# Patient Record
Sex: Female | Born: 1955
Health system: Southern US, Community
[De-identification: ages and names within clinical notes are randomized; demographics above are authoritative.]

## PROBLEM LIST (undated history)

## (undated) DIAGNOSIS — Z87442 Personal history of urinary calculi: Secondary | ICD-10-CM

## (undated) DIAGNOSIS — R188 Other ascites: Secondary | ICD-10-CM

## (undated) DIAGNOSIS — N189 Chronic kidney disease, unspecified: Secondary | ICD-10-CM

## (undated) DIAGNOSIS — R51 Headache: Secondary | ICD-10-CM

## (undated) DIAGNOSIS — K56609 Unspecified intestinal obstruction, unspecified as to partial versus complete obstruction: Secondary | ICD-10-CM

## (undated) DIAGNOSIS — G709 Myoneural disorder, unspecified: Secondary | ICD-10-CM

## (undated) DIAGNOSIS — M419 Scoliosis, unspecified: Secondary | ICD-10-CM

## (undated) DIAGNOSIS — M1990 Unspecified osteoarthritis, unspecified site: Secondary | ICD-10-CM

## (undated) DIAGNOSIS — Z789 Other specified health status: Secondary | ICD-10-CM

## (undated) DIAGNOSIS — I10 Essential (primary) hypertension: Secondary | ICD-10-CM

## (undated) DIAGNOSIS — Z87440 Personal history of urinary (tract) infections: Secondary | ICD-10-CM

## (undated) DIAGNOSIS — R1011 Right upper quadrant pain: Secondary | ICD-10-CM

## (undated) DIAGNOSIS — Z9289 Personal history of other medical treatment: Secondary | ICD-10-CM

## (undated) DIAGNOSIS — G35 Multiple sclerosis: Secondary | ICD-10-CM

## (undated) DIAGNOSIS — M810 Age-related osteoporosis without current pathological fracture: Secondary | ICD-10-CM

## (undated) DIAGNOSIS — D649 Anemia, unspecified: Secondary | ICD-10-CM

## (undated) DIAGNOSIS — B029 Zoster without complications: Secondary | ICD-10-CM

## (undated) HISTORY — PX: TONSILLECTOMY: SUR1361

## (undated) HISTORY — DX: Multiple sclerosis: G35

---
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
## 1997-12-29 ENCOUNTER — Encounter: Payer: Self-pay | Admitting: Emergency Medicine

## 1997-12-29 ENCOUNTER — Emergency Department (HOSPITAL_COMMUNITY): Admission: EM | Admit: 1997-12-29 | Discharge: 1997-12-29 | Payer: Self-pay | Admitting: Emergency Medicine

## 2001-03-20 ENCOUNTER — Other Ambulatory Visit: Admission: RE | Admit: 2001-03-20 | Discharge: 2001-03-20 | Payer: Self-pay | Admitting: Obstetrics and Gynecology

## 2005-06-04 ENCOUNTER — Ambulatory Visit: Payer: Self-pay | Admitting: Internal Medicine

## 2005-06-04 ENCOUNTER — Ambulatory Visit (HOSPITAL_COMMUNITY): Admission: RE | Admit: 2005-06-04 | Discharge: 2005-06-04 | Payer: Self-pay | Admitting: Internal Medicine

## 2007-05-20 ENCOUNTER — Other Ambulatory Visit: Admission: RE | Admit: 2007-05-20 | Discharge: 2007-05-20 | Payer: Self-pay | Admitting: Obstetrics and Gynecology

## 2008-06-08 ENCOUNTER — Other Ambulatory Visit: Admission: RE | Admit: 2008-06-08 | Discharge: 2008-06-08 | Payer: Self-pay | Admitting: Obstetrics and Gynecology

## 2008-07-13 ENCOUNTER — Encounter: Payer: Self-pay | Admitting: Obstetrics & Gynecology

## 2008-07-13 ENCOUNTER — Inpatient Hospital Stay (HOSPITAL_COMMUNITY): Admission: RE | Admit: 2008-07-13 | Discharge: 2008-07-14 | Payer: Self-pay | Admitting: Obstetrics & Gynecology

## 2009-01-28 HISTORY — PX: VAGINAL HYSTERECTOMY: SUR661

## 2009-08-04 ENCOUNTER — Encounter: Admission: RE | Admit: 2009-08-04 | Discharge: 2009-08-04 | Payer: Self-pay | Admitting: Family Medicine

## 2009-08-15 ENCOUNTER — Other Ambulatory Visit: Admission: RE | Admit: 2009-08-15 | Discharge: 2009-08-15 | Payer: Self-pay | Admitting: Diagnostic Radiology

## 2009-08-15 ENCOUNTER — Encounter: Admission: RE | Admit: 2009-08-15 | Discharge: 2009-08-15 | Payer: Self-pay | Admitting: Family Medicine

## 2010-02-18 ENCOUNTER — Encounter: Payer: Self-pay | Admitting: Family Medicine

## 2010-03-18 ENCOUNTER — Inpatient Hospital Stay (HOSPITAL_COMMUNITY)
Admission: EM | Admit: 2010-03-18 | Discharge: 2010-03-21 | DRG: 668 | Disposition: A | Discharge status: Home / Self Care | Payer: Medicare Other | Attending: Emergency Medicine | Admitting: Emergency Medicine

## 2010-03-18 DIAGNOSIS — R6520 Severe sepsis without septic shock: Secondary | ICD-10-CM | POA: Diagnosis present

## 2010-03-18 DIAGNOSIS — G35 Multiple sclerosis: Secondary | ICD-10-CM | POA: Diagnosis present

## 2010-03-18 DIAGNOSIS — N179 Acute kidney failure, unspecified: Secondary | ICD-10-CM | POA: Diagnosis present

## 2010-03-18 DIAGNOSIS — E871 Hypo-osmolality and hyponatremia: Secondary | ICD-10-CM | POA: Diagnosis present

## 2010-03-18 DIAGNOSIS — N390 Urinary tract infection, site not specified: Principal | ICD-10-CM | POA: Diagnosis present

## 2010-03-18 DIAGNOSIS — D500 Iron deficiency anemia secondary to blood loss (chronic): Secondary | ICD-10-CM | POA: Diagnosis present

## 2010-03-18 DIAGNOSIS — A419 Sepsis, unspecified organism: Secondary | ICD-10-CM | POA: Diagnosis present

## 2010-03-18 DIAGNOSIS — R310 Gross hematuria: Secondary | ICD-10-CM | POA: Diagnosis present

## 2010-03-18 HISTORY — DX: Essential (primary) hypertension: I10

## 2010-03-18 LAB — CBC
HCT: 27.2 % — ABNORMAL LOW (ref 36.0–46.0)
Hemoglobin: 9.3 g/dL — ABNORMAL LOW (ref 12.0–15.0)
MCH: 31.6 pg (ref 26.0–34.0)
MCHC: 34.2 g/dL (ref 30.0–36.0)
MCV: 92.5 fL (ref 78.0–100.0)
Platelets: 364 10*3/uL (ref 150–400)
RBC: 2.94 MIL/uL — ABNORMAL LOW (ref 3.87–5.11)
RDW: 12.8 % (ref 11.5–15.5)
WBC: 9 10*3/uL (ref 4.0–10.5)

## 2010-03-18 LAB — DIFFERENTIAL
Basophils Absolute: 0 10*3/uL (ref 0.0–0.1)
Basophils Relative: 0 % (ref 0–1)
Eosinophils Absolute: 0.1 10*3/uL (ref 0.0–0.7)
Eosinophils Relative: 1 % (ref 0–5)
Lymphocytes Relative: 30 % (ref 12–46)
Lymphs Abs: 2.7 10*3/uL (ref 0.7–4.0)
Monocytes Absolute: 0.4 10*3/uL (ref 0.1–1.0)
Monocytes Relative: 4 % (ref 3–12)
Neutro Abs: 5.8 10*3/uL (ref 1.7–7.7)
Neutrophils Relative %: 65 % (ref 43–77)

## 2010-03-18 LAB — COMPREHENSIVE METABOLIC PANEL
Albumin: 3.6 g/dL (ref 3.5–5.2)
BUN: 43 mg/dL — ABNORMAL HIGH (ref 6–23)
CO2: 20 mEq/L (ref 19–32)
Calcium: 9.2 mg/dL (ref 8.4–10.5)
Chloride: 102 mEq/L (ref 96–112)
Creatinine, Ser: 1.72 mg/dL — ABNORMAL HIGH (ref 0.4–1.2)
GFR calc non Af Amer: 31 mL/min — ABNORMAL LOW (ref >60)
Total Bilirubin: 0.4 mg/dL (ref 0.3–1.2)

## 2010-03-18 LAB — PROTIME-INR
INR: 1.01 (ref 0.00–1.49)
Prothrombin Time: 13.5 seconds (ref 11.6–15.2)

## 2010-03-18 LAB — URINALYSIS, ROUTINE W REFLEX MICROSCOPIC

## 2010-03-19 LAB — DIFFERENTIAL
Basophils Absolute: 0 10*3/uL (ref 0.0–0.1)
Basophils Relative: 0 % (ref 0–1)
Eosinophils Absolute: 0.2 10*3/uL (ref 0.0–0.7)
Eosinophils Relative: 3 % (ref 0–5)
Lymphocytes Relative: 53 % — ABNORMAL HIGH (ref 12–46)
Lymphs Abs: 3.5 10*3/uL (ref 0.7–4.0)
Monocytes Absolute: 0.5 10*3/uL (ref 0.1–1.0)
Monocytes Relative: 7 % (ref 3–12)
Neutro Abs: 2.4 10*3/uL (ref 1.7–7.7)
Neutrophils Relative %: 37 % — ABNORMAL LOW (ref 43–77)

## 2010-03-19 LAB — CBC
HCT: 23.3 % — ABNORMAL LOW (ref 36.0–46.0)
Hemoglobin: 7.7 g/dL — ABNORMAL LOW (ref 12.0–15.0)
MCH: 31.3 pg (ref 26.0–34.0)
MCHC: 33 g/dL (ref 30.0–36.0)
MCV: 94.7 fL (ref 78.0–100.0)
Platelets: 286 10*3/uL (ref 150–400)
RBC: 2.46 MIL/uL — ABNORMAL LOW (ref 3.87–5.11)
RDW: 13 % (ref 11.5–15.5)
WBC: 6.5 10*3/uL (ref 4.0–10.5)

## 2010-03-19 LAB — URINE CULTURE: Culture: NO GROWTH

## 2010-03-19 LAB — BASIC METABOLIC PANEL
BUN: 28 mg/dL — ABNORMAL HIGH (ref 6–23)
CO2: 23 mEq/L (ref 19–32)
Calcium: 8.4 mg/dL (ref 8.4–10.5)
Chloride: 107 mEq/L (ref 96–112)
Creatinine, Ser: 1.12 mg/dL (ref 0.4–1.2)
GFR calc Af Amer: >60 mL/min (ref >60)
GFR calc non Af Amer: 50 mL/min — ABNORMAL LOW (ref >60)
Glucose, Bld: 87 mg/dL (ref 70–99)
Potassium: 4.3 mEq/L (ref 3.5–5.1)
Sodium: 135 mEq/L (ref 135–145)

## 2010-03-20 ENCOUNTER — Inpatient Hospital Stay (HOSPITAL_COMMUNITY): Payer: Medicare Other

## 2010-03-20 ENCOUNTER — Encounter (HOSPITAL_COMMUNITY): Payer: Self-pay | Admitting: Radiology

## 2010-03-20 LAB — COMPREHENSIVE METABOLIC PANEL
ALT: 13 U/L (ref 0–35)
Albumin: 3.1 g/dL — ABNORMAL LOW (ref 3.5–5.2)
Alkaline Phosphatase: 45 U/L (ref 39–117)
GFR calc Af Amer: >60 mL/min (ref >60)
Potassium: 4.1 mEq/L (ref 3.5–5.1)
Sodium: 140 mEq/L (ref 135–145)
Total Protein: 6.3 g/dL (ref 6.0–8.3)

## 2010-03-20 LAB — CROSSMATCH
ABO/RH(D): O POS
Antibody Screen: NEGATIVE
Unit division: 0

## 2010-03-20 LAB — DIFFERENTIAL
Basophils Absolute: 0 10*3/uL (ref 0.0–0.1)
Basophils Relative: 0 % (ref 0–1)
Eosinophils Absolute: 0.2 10*3/uL (ref 0.0–0.7)
Eosinophils Relative: 3 % (ref 0–5)
Lymphocytes Relative: 43 % (ref 12–46)

## 2010-03-20 LAB — CBC
Platelets: 291 10*3/uL (ref 150–400)
RDW: 13.6 % (ref 11.5–15.5)
WBC: 7.8 10*3/uL (ref 4.0–10.5)

## 2010-03-20 MED ORDER — IOHEXOL 300 MG/ML  SOLN
100.0000 mL | Freq: Once | INTRAMUSCULAR | Status: AC | PRN
  Administered 2010-03-20: 100 mL via INTRAVENOUS

## 2010-03-21 ENCOUNTER — Other Ambulatory Visit: Payer: Self-pay | Admitting: Urology

## 2010-03-21 ENCOUNTER — Inpatient Hospital Stay (HOSPITAL_COMMUNITY): Payer: Medicare Other

## 2010-03-21 ENCOUNTER — Other Ambulatory Visit: Payer: Self-pay | Admitting: Internal Medicine

## 2010-03-21 LAB — DIFFERENTIAL
Basophils Relative: 0 % (ref 0–1)
Eosinophils Absolute: 0.2 10*3/uL (ref 0.0–0.7)
Monocytes Absolute: 0.5 10*3/uL (ref 0.1–1.0)
Monocytes Relative: 7 % (ref 3–12)

## 2010-03-21 LAB — BASIC METABOLIC PANEL
BUN: 8 mg/dL (ref 6–23)
CO2: 29 mEq/L (ref 19–32)
Calcium: 8.9 mg/dL (ref 8.4–10.5)
Creatinine, Ser: 0.65 mg/dL (ref 0.4–1.2)
GFR calc Af Amer: >60 mL/min (ref >60)

## 2010-03-21 LAB — CBC
Hemoglobin: 11.4 g/dL — ABNORMAL LOW (ref 12.0–15.0)
MCH: 31.5 pg (ref 26.0–34.0)
MCHC: 34.4 g/dL (ref 30.0–36.0)
MCV: 91.4 fL (ref 78.0–100.0)
Platelets: 297 10*3/uL (ref 150–400)

## 2010-03-21 LAB — URINE CULTURE: Culture: NO GROWTH

## 2010-03-21 NOTE — Consult Note (Signed)
  NAMEJANNIFER, [name]               ACCOUNT NO.:  [id number]  MEDICAL RECORD NO.:  [id number]           PATIENT TYPE:  I  LOCATION:  A335                          FACILITY:  [facility]  PHYSICIAN:  Ky Barban, M.D.DATE OF BIRTH:  [birth year]  DATE OF CONSULTATION:  03/19/2010 DATE OF DISCHARGE:                                CONSULTATION   CHIEF COMPLAINT:  Gross total painless hematuria.  HISTORY:  A 55 year old female for the last couple of days she is having gross total painless hematuria.  She has severe nausea but no vomiting, fever, chills, and denies having similar problem in the past. Occasionally she has some urgency, urge incontinence, but uses a pad sometime.  Her other problem that she has 3 urinary tract infections during this year and she has been referred to a urologist by her gynecologist, she is waiting to see a urologist.  In the meantime, this thing happened, so she is admitted here by the Hospitalist Service.  She denies having any gross hematuria in the past, but her hematocrit was 27, so she is getting 2 units of blood.  She denies history of having any melena.  No fever or chills.  PAST MEDICAL HISTORY: 1. She has a history of having multiple sclerosis but it is in     remission and she is having absolutely no problem from it. 2. She has hypertension and also had a hysterectomy sometime ago.  MEDICATIONS:  She takes Betaseron for multiple sclerosis, lisinopril for hypertension and Septra and multiple vitamin and calcium.  PHYSICAL EXAMINATION:  GENERAL: Moderately built female not in acute distress, fully conscious, alert, oriented. VITAL SIGNS:  Blood pressure is 120/80, temperature is normal.  On admission, at the time of presentation to the ER her blood pressure was 95/60, she was having tachycardia. ABDOMEN:  Soft and flat.  Liver, spleen, kidneys not palpable.  No CVA tenderness. PELVIC:  Deferred.  She is voiding urine with brownish-colored  urine. She may have no blood clots in the bladder.  She also takes oxybutynin for urgency or urge incontinence which helps her.  LABORATORY DATA:  Her Hemoglobin 7.7, WBC count is 6.5, BUN is 28, and creatinine 1.12.  IMPRESSION: 1. Gross hematuria, possible urinary tract infection. 2. Anemia secondary to possible hematuria. 3. Multiple sclerosis  RECOMMENDATIONS:  Continue IV fluids and do a urine culture.  CT of the abdomen and pelvis with and without contrast.  She will probably need cystoscopy.  I will also send a urine cytologies on her today.     Ky Barban, M.D.     MIJ/MEDQ  D:  03/19/2010  T:  03/20/2010  Job:  [id number]  Electronically Signed by Alleen Borne M.D. on 03/21/2010 11:58:07 AM

## 2010-03-24 NOTE — Discharge Summary (Signed)
NAMEAJA, WHITEHAIR               ACCOUNT NO.:  [id number]  MEDICAL RECORD NO.:  [id number]           PATIENT TYPE:  I  LOCATION:  A335                          FACILITY:  [facility]  PHYSICIAN:  Tarry Kos, MD       DATE OF BIRTH:  Feb 03, 1955  DATE OF ADMISSION:  03/18/2010 DATE OF DISCHARGE:  02/22/2012LH                              DISCHARGE SUMMARY   DISCHARGE DIAGNOSES: 1. Gross hematuria, requiring blood transfusion. 2. Urinary tract infection. 3. History of multiple sclerosis. 4. Status post cystoscopy today with biopsy pending along with urine     cytology pending. 5. Acute renal failure secondary to above that is resolved.  SUMMARY OF HOSPITAL COURSE:  Ms. Reddington is a pleasant 55 year old female with a history of multiple sclerosis, who presented to the emergency room on March 18, 2010, with gross hematuria.  She had significant hematuria that her hemoglobin had dropped to 7.7.  She was given 10 units of blood transfusion, packed red blood cells, and her hemoglobin has been stable at around 11.4.  Urology was consulted.  Her initial urinalysis was overtly red.  She was started on Rocephin here in the hospital.  She had been previously started on Bactrim also by an outpatient physician and had come in with acute renal failure with a BUN and creatinine of 43 and 1.7.  She was provided IV fluids and antibiotics and her renal function returned to normal at 8 and 0.65. Her urine culture has been negative.  Again, Urology was consulted.  She had a CT of her abdomen and pelvis, which showed broad-based bladder wall thickening, seeing anteriorly and superiorly, was likely due to cystitis with neoplasm considered less likely.  Large amount of gas in the urinary bladder, which may be due to recent bladder catheterization; however, fistula could not be excluded.  No evidence of upper urinary tract disease.  She subsequently underwent a cystoscopy today with Urology.  Please see  that final report for full details.  A biopsy was done and that is pending.  She also had urine cytology sent off and that is also pending.  PHYSICAL EXAMINATION:  VITAL SIGNS:  She has been afebrile.  Her vital signs have been stable. GENERAL:  Her hematuria has completely resolved.  She has been alert and oriented x4.  No apparent distress cooperative, and friendly. COR:  Regular rate and rhythm without murmurs, rubs, or gallops. CHEST:  Clear to auscultation bilaterally.  No wheeze, rhonchi, or rales. ABDOMEN:  Soft, nontender, nondistended, positive bowel sounds.  No hepatosplenomegaly. EXTREMITIES:  Without clubbing, cyanosis, or edema. PSYCHIATRIC:  Normal affect. NEUROLOGIC:  No focal neurologic deficits. SKIN:  No rashes.  PLAN:  Ms. Angelita Ingles is going to be discharged home to follow up with the primary care physician in 1-2 weeks, also with urologist in 2-4 weeks. I have switched her over to take ciprofloxacin instead of the Bactrim due to her renal failure.  She will complete a course of ciprofloxacin for another 5 days.  Please see discharge med rec sheet for full details.  She is to resume the rest of her home  medications.  In addition, again I have given her Cipro 250 mg twice a day for another 5 days.  Again at follow up, she has a pathology of her biopsy results today from her cystoscopy that is pending and urine cytology is also pending.                                           ______________________________ Tarry Kos, MD     RD/MEDQ  D:  03/21/2010  T:  03/22/2010  Job:  [id number]  Electronically Signed by Eldridge Dace MD on 03/24/2010 01:56:40 PM

## 2010-03-24 NOTE — H&P (Signed)
NAMEGEROLDINE, [name]               ACCOUNT NO.:  [id number]  MEDICAL RECORD NO.:  [id number]           PATIENT TYPE:  I  LOCATION:  A202                          FACILITY:  [facility]  PHYSICIAN:  Celso Amy, MD   DATE OF BIRTH:  12-Jun-1955  DATE OF ADMISSION:  03/18/2010 DATE OF DISCHARGE:  LH                             HISTORY & PHYSICAL   CHIEF COMPLAINT:  Hematuria.  HISTORY OF PRESENT ILLNESS:  The patient is a 55 year old white female with a past medical history of multiple sclerosis, hypertension who presents to ER with a chief complaint of hematuria.  History of present illness dates back to last Thursday when the patient had mild hematuria. The patient went to her Ob/Gyn and was started on p.o. Bactrim.  Later last night, the patient had sudden onset of hematuria again.  The patient described it as that earlier.  Her urine was pink and this time it was pure blood.  The patient was at her mother's house taking care of her elderly whether so she could not come last night and the patient come to ER this morning.  No complaint of fever, but the patient does complaint of chills.  No complaint of chest pain or shortness of breath. The patient has no complaint of recent travel.  No complaint of recent instrumentation.  The patient does complaint of urgency and dysuria. The patient does complain of nausea earlier, but she states she is better now.  No complaint of any focal motor neurological deficit.  REVIEW OF SYSTEMS:  Positive for generalized weakness.  PAST MEDICAL HISTORY: 1. Multiple sclerosis. 2. Hypertension. 3. Status post hysterectomy.  The patient's primary care is Dr. Benedetto [name].  The patient's Ob/Gyn is Dr. Cyril [name].  The patient's neurologist is Dr. Harlen [name].  MEDICATIONS:  The patient is on; 1. Betaseron for her multiple sclerosis. 2. Lisinopril 10 mg p.o. daily. 3. Septra one tablet p.o. b.i.d. 4. Multivitamin one tablet p.o. daily. 5.  Calcium 500 mg p.o. daily.  PHYSICAL EXAMINATION:  VITAL SIGNS:  The patient's blood pressure at this point is 117/50 and at the time of presentation, it was 95/60, pulse 91, respiratory rate 20, temperature afebrile. GENERAL:  The patient is awake, alert, oriented to time, place and person, well built, well nourished, lying comfortably on the bed. HEENT:  Pupils are equally reactive to light and accommodation.  Head is atraumatic, normocephalic. NECK:  Supple. RESPIRATORY:  No acute respiratory distress. CHEST:  Clear to auscultation bilaterally.  There was no costovertebral angle tenderness. CARDIOVASCULAR:  S1-S2, regular rate and rhythm.  Heart was tachycardic. GI:  Deep bowel sounds present.  Abdomen is soft.  There is mild suprapubic tenderness. EXTREMITIES:  No lower extremity edema.  Strength is 5/5 bilaterally. CNS:  Cranial nerves II-XII are grossly intact.  No focal motor neurological deficits.  POSTOP LABORATORY DATA:  Sodium 133, potassium 4.8, serum chloride 102, bicarb 20, BUN 43, serum creatinine 1.7.  The patient's hemoglobin is 9.3.  The patient's platelets are 364.  Lactate was 1.6.  UA was very turbid.  IMPRESSION: 1. Infection disease.  The patient  is being admitted with the most     likely urinary tract infection.  The patient most likely has severe     case of cystitis causing her hematuria. 2. Central nervous system:  The patient has history of multiple     sclerosis, the patient is on Betaseron. 3. Cardiovascular:  The patient's hypertension is stable at this time. 4. Acute kidney injury.  The patient has acute kidney injury most     likely because of hypovolemia plus systemic inflammatory response     syndrome plus post renal issues. 5. Deep venous thrombosis.  We will keep the patient on deep venous     thrombosis prophylaxis. 6. Fluid electrolyte.  The patient is hyponatremic most likely because     of hypovolemia.  PLAN: 1. We will admit to the  regular medicine floor to [facility] Team 2.  We will     give IV fluids, normal saline. 2. We will start IV ceftriaxone. 3. We will ask for urology consult. 4. We will hold Betaseron as it is can cause immuno suppression. 5. We will use SCDs for DVT prophylaxis, not give Lovenox with the     patient is bleeding. 6. We will follow CBC for the need of blood transfusion. 7. Further clinical course will depend upon how the patient does.     Celso Amy, MD     MB/MEDQ  D:  03/18/2010  T:  03/18/2010  Job:  [id number]  Electronically Signed by Celso Amy M.D. on 03/24/2010 02:19:09 PM

## 2010-03-26 NOTE — Op Note (Signed)
  [name], [name]               ACCOUNT NO.:  [id number]  MEDICAL RECORD NO.:  [id number]           PATIENT TYPE:  I  LOCATION:  A335                          FACILITY:  [facility]  PHYSICIAN:  Ky Barban, M.D.DATE OF BIRTH:  1955-11-27  DATE OF PROCEDURE: DATE OF DISCHARGE:  03/21/2010                              OPERATIVE REPORT   PREOPERATIVE DIAGNOSIS:  Gross hematuria.  POSTOPERATIVE DIAGNOSIS:  Cystitis.  ANESTHESIA:  General.  PROCEDURES: 1. Cystoscopy. 2. Multiple bladder biopsies. 3. Cystogram  DESCRIPTION OF PROCEDURE:  The patient under general endotracheal anesthesia in lithotomy position, after usual prep and drape, a #25 cystoscope was introduced into the bladder.  The most of the bladder mucosa looks fine, but the anterior bladder wall including the dome of the bladder, there was bullous edema and some blood clot sticking to this area, but there is no gross evidence of any tumor.  This area was biopsied with the help of a flexible biopsy forceps and then using a Greenwald electrode, I cauterized this bullous area to some extent.  I did a cystogram to make sure there is no enterovesical fistula.  The bladder was filled up with about 300 mL of Hypaque solution under fluoroscopic control.  I do not see any fistula.  The bladder was emptied.  Oblique pictures and drainage pictures were also reviewed.  I do not see any fistula.  All the instruments were removed and bimanual pelvic exam was done.  The bladder feels soft and supple.  I did not feel any hard area in the bladder.  All the instruments were removed. The patient left the operating room in satisfactory condition.     Ky Barban, M.D.     MIJ/MEDQ  D:  03/21/2010  T:  03/22/2010  Job:  [id number]  Electronically Signed by Alleen Borne M.D. on 03/26/2010 09:40:08 AM

## 2010-05-07 LAB — DIFFERENTIAL
Basophils Relative: 0 % (ref 0–1)
Lymphocytes Relative: 32 % (ref 12–46)
Lymphs Abs: 2.1 10*3/uL (ref 0.7–4.0)
Monocytes Absolute: 0.5 10*3/uL (ref 0.1–1.0)
Monocytes Relative: 7 % (ref 3–12)
Neutro Abs: 4 10*3/uL (ref 1.7–7.7)
Neutrophils Relative %: 60 % (ref 43–77)

## 2010-05-07 LAB — URINALYSIS, ROUTINE W REFLEX MICROSCOPIC
Bilirubin Urine: NEGATIVE
Hgb urine dipstick: NEGATIVE
Ketones, ur: NEGATIVE mg/dL
Nitrite: NEGATIVE
Specific Gravity, Urine: 1.015 (ref 1.005–1.030)
pH: 6 (ref 5.0–8.0)

## 2010-05-07 LAB — CBC
HCT: 33.3 % — ABNORMAL LOW (ref 36.0–46.0)
Hemoglobin: 11.6 g/dL — ABNORMAL LOW (ref 12.0–15.0)
Hemoglobin: 11.7 g/dL — ABNORMAL LOW (ref 12.0–15.0)
MCHC: 35.2 g/dL (ref 30.0–36.0)
MCHC: 35.1 g/dL (ref 30.0–36.0)
MCV: 96.9 fL (ref 78.0–100.0)
RBC: 3.42 MIL/uL — ABNORMAL LOW (ref 3.87–5.11)
RBC: 3.43 MIL/uL — ABNORMAL LOW (ref 3.87–5.11)
RDW: 13.1 % (ref 11.5–15.5)
WBC: 6.7 10*3/uL (ref 4.0–10.5)

## 2010-05-07 LAB — COMPREHENSIVE METABOLIC PANEL
ALT: 25 U/L (ref 0–35)
BUN: 16 mg/dL (ref 6–23)
CO2: 29 mEq/L (ref 19–32)
Calcium: 9.4 mg/dL (ref 8.4–10.5)
GFR calc non Af Amer: >60 mL/min (ref >60)
Glucose, Bld: 86 mg/dL (ref 70–99)
Total Protein: 6.5 g/dL (ref 6.0–8.3)

## 2010-05-07 LAB — TYPE AND SCREEN

## 2010-05-07 LAB — HCG, QUANTITATIVE, PREGNANCY: hCG, Beta Chain, Quant, S: <2 m[IU]/mL (ref <5)

## 2010-06-12 NOTE — Op Note (Signed)
NAMEROBERTA, [name]               ACCOUNT NO.:  [id number]   MEDICAL RECORD NO.:  [id number]          PATIENT TYPE:  OIB   LOCATION:  A339                          FACILITY:  [facility]   PHYSICIAN:  Lazaro Arms, M.D.   DATE OF BIRTH:  1955/10/26   DATE OF PROCEDURE:  07/13/2008  DATE OF DISCHARGE:                               OPERATIVE REPORT   PREOPERATIVE DIAGNOSIS:  Symptomatic grade 3 uterine prolapse.   POSTOPERATIVE DIAGNOSIS:  Symptomatic grade 3 uterine prolapse.   PROCEDURE:  Total vaginal hysterectomy/right salpingo-oophorectomy.   SURGEON:  Lazaro Arms, MD   ANESTHESIA.:  General endotracheal.   FINDINGS:  The patient had grade 3 prolapse.  Her bladder and rectum had  no prolapse.  Her uterus was quite small.  Her right ovary and tube  could be identified and never could find her left ovary.  I found her  left tube, but no ovary to be found.   DESCRIPTION OF OPERATION:  The patient was taken to the operating room,  placed in supine position where she underwent general tracheal  anesthesia, placed in dorsal lithotomy position, prepped and draped in  usual sterile fashion.  Foley catheter was placed.  A weighted speculum  was placed in posterior vagina.  The uterus was grasped with a thyroid  tenaculum.  Marcaine 0.5% with 1:200000 epinephrine was injected  circumferentially about the cervix.  For hemostasis and plain  delineation, electrocautery unit was used.  The vagina was taken off the  cervix without difficulty.  The posterior cul-de-sac was entered.  Uterosacral ligaments were clamped, cut, and suture ligated.  The  cardinal was clamped, cut, and suture ligated.  Anterior cul-de-sac was  entered without difficulty.  The anterior and posterior leaves of broad  ligament were plicated and the uterine vessels were clamped, cut, and  suture ligated.  Serial pedicles taken off the cervix up the uterus,  each pedicle clamped, cut, and suture ligated.  Utero-ovarian  ligaments  cross-clamped and sutured.  The right ovary was identified.  The  infundibulopelvic ligament was cross-clamped, suture ligated with good  hemostasis.  The left ovary could never be identified.  The vagina and  peritoneum, anterior and posterior were closed side-to-side without  difficulty with good hemostasis.  Pelvis irrigated vigorously.  Blood  loss was minimal.  The patient was awake from anesthesia, taken to  recovery room in good stable condition.  Counts were correct.  She  received Ancef prophylactically.      Lazaro Arms, M.D.  Electronically Signed    LHE/MEDQ  D:  07/13/2008  T:  07/14/2008  Job:  [id number]

## 2010-06-13 ENCOUNTER — Other Ambulatory Visit: Payer: Self-pay | Admitting: Obstetrics & Gynecology

## 2010-06-13 ENCOUNTER — Other Ambulatory Visit: Payer: Self-pay | Admitting: Internal Medicine

## 2010-06-13 DIAGNOSIS — R102 Pelvic and perineal pain: Secondary | ICD-10-CM

## 2010-06-13 DIAGNOSIS — N390 Urinary tract infection, site not specified: Secondary | ICD-10-CM

## 2010-06-15 ENCOUNTER — Ambulatory Visit (HOSPITAL_COMMUNITY): Admission: RE | Admit: 2010-06-15 | Payer: Medicare Other | Source: Ambulatory Visit

## 2010-06-15 ENCOUNTER — Ambulatory Visit (HOSPITAL_COMMUNITY)
Admission: RE | Admit: 2010-06-15 | Discharge: 2010-06-15 | Disposition: A | Discharge status: Home / Self Care | Payer: Medicare Other | Source: Ambulatory Visit | Attending: Obstetrics & Gynecology | Admitting: Obstetrics & Gynecology

## 2010-06-15 DIAGNOSIS — R9389 Abnormal findings on diagnostic imaging of other specified body structures: Secondary | ICD-10-CM | POA: Insufficient documentation

## 2010-06-15 DIAGNOSIS — N390 Urinary tract infection, site not specified: Secondary | ICD-10-CM

## 2010-06-15 DIAGNOSIS — N949 Unspecified condition associated with female genital organs and menstrual cycle: Secondary | ICD-10-CM | POA: Insufficient documentation

## 2010-06-15 DIAGNOSIS — R102 Pelvic and perineal pain: Secondary | ICD-10-CM

## 2010-06-15 NOTE — Discharge Summary (Signed)
[name], WOODSTOCK               ACCOUNT NO.:  [id number]   MEDICAL RECORD NO.:  [id number]          PATIENT TYPE:  INP   LOCATION:  A339                          FACILITY:  [facility]   PHYSICIAN:  Lazaro Arms, M.D.   DATE OF BIRTH:  1955-03-17   DATE OF ADMISSION:  07/13/2008  DATE OF DISCHARGE:  06/17/2010LH                               DISCHARGE SUMMARY   DISCHARGE DIAGNOSES:  1. Status post total vaginal hysterectomy, right salpingo-      oophorectomy, grade-III uterine prolapse.  2. Unremarkable postoperative course.   PROCEDURE:  Total vaginal hysterectomy and right salpingo-oophorectomy.   Please refer to the history and physical as well as the operative report  for details of admission to the hospital.   HOSPITAL COURSE:  The patient was admitted postoperatively.  Intraoperative course was unremarkable.  We could not find her left  ovary, this was not there, otherwise everything went well.  Postoperatively, she tolerated clear liquids and regular diet, voided  without symptoms, was extensively ambulatory, and was afebrile.  Her  hemoglobin and hematocrit postop were excellent at 11.6 and 33.  White  count 6700.  She was discharged on the morning of postoperative day #1  in good stable condition.  Follow up in the office next week.  She was  given Percocet for pain and followup instructions and Cipro for  antibiotic.      Lazaro Arms, M.D.  Electronically Signed     LHE/MEDQ  D:  08/24/2008  T:  08/25/2008  Job:  [id number]

## 2010-06-15 NOTE — Op Note (Signed)
[name], [name]               ACCOUNT NO.:  [id number]   MEDICAL RECORD NO.:  [id number]          PATIENT TYPE:  AMB   LOCATION:  DAY                           FACILITY:  [facility]   PHYSICIAN:  R. Roetta Sessions, M.D. DATE OF BIRTH:  02/13/1955   DATE OF PROCEDURE:  06/04/2005  DATE OF DISCHARGE:                                 OPERATIVE REPORT   PROCEDURE:  Screening colonoscopy.   INDICATIONS FOR PROCEDURE:  The patient is a 55 year old Caucasian female  with no bowel symptoms per the courtesy of [facility] for colorectal cancer  screening. She has never had her lower GI tract imaged. There is no family  history of colorectal neoplasia. Colonoscopy is now being done as a  screening maneuver. This approach has been discussed with the patient at  length. The risks, benefits, and alternatives have been reviewed, questions  answered. She is agreeable. Please see the documentation in the medical  record.   MONITORING:  O2 saturation, blood pressure, pulse and respirations were  monitored throughout the entire procedure.   CONSCIOUS SEDATION:  Versed 3 mg IV, Demerol 75 mg IV in divided doses.   INSTRUMENT:  Olympus videochip system.   FINDINGS:  Digital rectal exam revealed no abnormalities.   ENDOSCOPIC FINDINGS:  The prep was good.   RECTUM:  Examination of the rectal mucosa including retroflexed view of the  anal verge revealed no abnormalities.   COLON:  The colonic mucosa was surveyed from the rectosigmoid junction  through the left transverse and right colon to the area of the appendiceal  orifice, ileocecal valve and cecum. These structures were seen and  photographed for the record. From this level, the scope was slowly and  cautiously withdrawn. All previously mentioned mucosal surfaces were again  seen. The colonic mucosa appeared normal aside from a single diverticulum in  the sigmoid colon. The patient tolerated the procedure well and was reacted  in endoscopy.   IMPRESSION:  1.  Normal rectum.  2.  Single sigmoid diverticulum otherwise normal colon.   RECOMMENDATIONS:  Repeat screening colonoscopy in 10 years.      Jonathon Bellows, M.D.  Electronically Signed     RMR/MEDQ  D:  06/04/2005  T:  06/04/2005  Job:  [id number]   cc:   Tilda Burrow, M.D.  Fax: [phone]

## 2010-06-18 ENCOUNTER — Encounter: Payer: Self-pay | Admitting: Adult Health

## 2010-06-18 NOTE — Progress Notes (Unsigned)
[name] has recurrent UTI symptoms and pelvic pressure. Renal and pelvic ultrasound performed May 18, shows bladder wall thickening, [name] needs see Dr. at urology for followup. Patient has a followup appointment with urologist.

## 2010-07-06 ENCOUNTER — Other Ambulatory Visit: Payer: Self-pay | Admitting: Anesthesiology

## 2010-07-06 ENCOUNTER — Encounter (HOSPITAL_COMMUNITY): Payer: Medicare Other

## 2010-07-06 ENCOUNTER — Other Ambulatory Visit: Payer: Self-pay | Admitting: Infectious Diseases

## 2010-07-06 LAB — BASIC METABOLIC PANEL
CO2: 31 mEq/L (ref 19–32)
Calcium: 10.3 mg/dL (ref 8.4–10.5)
Chloride: 101 mEq/L (ref 96–112)
Glucose, Bld: 112 mg/dL — ABNORMAL HIGH (ref 70–99)
Potassium: 4.3 mEq/L (ref 3.5–5.1)
Sodium: 138 mEq/L (ref 135–145)

## 2010-07-06 LAB — HEMOGLOBIN AND HEMATOCRIT, BLOOD: HCT: 31.7 % — ABNORMAL LOW (ref 36.0–46.0)

## 2010-07-06 LAB — SURGICAL PCR SCREEN
MRSA, PCR: NEGATIVE
Staphylococcus aureus: NEGATIVE

## 2010-07-10 ENCOUNTER — Ambulatory Visit (HOSPITAL_COMMUNITY)
Admission: RE | Admit: 2010-07-10 | Discharge: 2010-07-10 | Disposition: A | Discharge status: Home / Self Care | Payer: Medicare Other | Source: Ambulatory Visit | Attending: Urology | Admitting: Urology

## 2010-07-10 ENCOUNTER — Other Ambulatory Visit: Payer: Self-pay | Admitting: Urology

## 2010-07-10 ENCOUNTER — Ambulatory Visit (HOSPITAL_COMMUNITY): Payer: Medicare Other

## 2010-07-10 DIAGNOSIS — I10 Essential (primary) hypertension: Secondary | ICD-10-CM | POA: Insufficient documentation

## 2010-07-10 DIAGNOSIS — Z79899 Other long term (current) drug therapy: Secondary | ICD-10-CM | POA: Insufficient documentation

## 2010-07-10 DIAGNOSIS — Z01810 Encounter for preprocedural cardiovascular examination: Secondary | ICD-10-CM | POA: Insufficient documentation

## 2010-07-10 DIAGNOSIS — N3020 Other chronic cystitis without hematuria: Secondary | ICD-10-CM | POA: Insufficient documentation

## 2010-07-10 DIAGNOSIS — Z01812 Encounter for preprocedural laboratory examination: Secondary | ICD-10-CM | POA: Insufficient documentation

## 2010-07-11 NOTE — H&P (Addendum)
  [name]               ACCOUNT NO.:  [id number]  MEDICAL RECORD NO.:  [id number]  LOCATION:                                 FACILITY:  PHYSICIAN:  Ky Barban, M.D.DATE OF BIRTH:  04/12/1955  DATE OF ADMISSION: DATE OF DISCHARGE:                             HISTORY & PHYSICAL   CHIEF COMPLAINT:  Recurrent gross hematuria.  HISTORY:  This 55 year old female was in [facility] in February 2012 with history of recurrent gross hematuria.  She also has a history of having multiple sclerosis, but he is in remission having no signs of the disease and at that time because of gross hematuria did a complete workup, did a cysto, there was bullous edema on the dome of the bladder, biopsied that area and it came back negative for any cancer and I sent her home at that time.  I wanted to do another cysto, but in the meantime she was seen by her gynecologist, did a CT scan.  It shows again the same findings.  There is a thickened bladder, anterior bladder wall.  She had pelvic ultrasound done and it shows there is thickening again noted as seen on prior CT and it is in the same area which is dome of the bladder.  There is again some question about cancer of the bladder in that area and I have suggested a repeat rebiopsy of it, do a cystoscopy under anesthesia, also do a cystogram to rule out any enterovesical fistula.  I have explained this to the patient and she understands, wanted me to go ahead and proceed.  PAST MEDICAL HISTORY:  As I said in February she was here and also has a history of multiple sclerosis.  No history of any diabetes or hypertension.  Does have hypertension for which he takes medicines.  MEDICATIONS:  She takes lisinopril, calcium, B1, multivitamins, and oxybutynin.  PERSONAL HISTORY/SOCIAL HISTORY:  She is married.  Does not smoke or drink.  REVIEW OF SYSTEMS:  Denies any chest pain, orthopnea, PND, nausea, or vomiting.  PHYSICAL  EXAMINATION:  VITAL SIGNS:  Blood pressure 120/61, temperature is 97. CENTRAL NERVOUS SYSTEM:  No gross neurological deficit. HEAD, NECK, EYE, ENT:  Negative. CHEST:  Symmetrical. HEART:  Regular sinus rhythm.  No murmur. ABDOMEN:  Soft, flat.  Liver, spleen, and kidneys are not palpable.  No CVA tenderness. PELVIC:  Deferred. EXTREMITIES:  Normal.  IMPRESSION:  Recurrent gross hematuria, rule out bladder tumor or enterovesical fistula.  PLAN:  Cystoscopy, multiple bladder biopsies, and cystogram under anesthesia as an outpatient.     Ky Barban, M.D.     MIJ/MEDQ  D:  07/09/2010  T:  07/10/2010  Job:  [id number]  Electronically Signed by Alleen Borne M.D. on 07/11/2010 01:25:42 PM

## 2010-07-11 NOTE — Op Note (Signed)
  [name], [name]               ACCOUNT NO.:  [id number]  MEDICAL RECORD NO.:  [id number]  LOCATION:  DAYP                          FACILITY:  [facility]  PHYSICIAN:  Ky Barban, M.D.DATE OF BIRTH:  December 01, 1955  DATE OF PROCEDURE:  07/10/2010 DATE OF DISCHARGE:                              OPERATIVE REPORT   PREOPERATIVE DIAGNOSIS:  Recurrent gross hematuria.  POSTOPERATIVE DIAGNOSIS:  Chronic and acute cystitis versus bladder tumor.  PROCEDURES:  Cystoscopy, bladder biopsy, fulguration of the biopsy site, and cystogram.  SURGEON:  Ky Barban, MD  ANESTHESIA:  General.  DESCRIPTION OF PROCEDURE:  The patient under general anesthesia in lithotomy position, after usual prep and drape, #25 cystoscope introduced into the bladder and it was inspected.  There was inflamed area of the bladder on the junction of the anterior bladder wall and the posterior bladder wall to the right of the midline.  It appears very suspicious, but I am not sure if it is a tumor, so a biopsy with the help of a flexible biopsy forceps were done.  Then biopsies sites were fulgurated with the help of a Greenwald electrode.  Rest of the bladder grossly looks normal.  The bladder was filled up with 350 mL of Hypaque solution to rule out any enterovesical fistula.  I do not see any fistula and the Foley catheter was left in.  The patient left the operating room in satisfactory condition.     Ky Barban, M.D.     MIJ/MEDQ  D:  07/10/2010  T:  07/11/2010  Job:  [id number]  Electronically Signed by Alleen Borne M.D. on 07/11/2010 01:25:49 PM

## 2010-09-07 ENCOUNTER — Emergency Department (HOSPITAL_COMMUNITY): Payer: Medicare Other

## 2010-09-07 ENCOUNTER — Emergency Department (HOSPITAL_COMMUNITY)
Admission: EM | Admit: 2010-09-07 | Discharge: 2010-09-07 | Disposition: A | Discharge status: Home / Self Care | Payer: Medicare Other | Attending: Emergency Medicine | Admitting: Emergency Medicine

## 2010-09-07 DIAGNOSIS — R5381 Other malaise: Secondary | ICD-10-CM | POA: Insufficient documentation

## 2010-09-07 DIAGNOSIS — I498 Other specified cardiac arrhythmias: Secondary | ICD-10-CM | POA: Insufficient documentation

## 2010-09-07 DIAGNOSIS — N390 Urinary tract infection, site not specified: Secondary | ICD-10-CM | POA: Insufficient documentation

## 2010-09-07 DIAGNOSIS — I10 Essential (primary) hypertension: Secondary | ICD-10-CM | POA: Insufficient documentation

## 2010-09-07 DIAGNOSIS — G35 Multiple sclerosis: Secondary | ICD-10-CM | POA: Insufficient documentation

## 2010-09-07 DIAGNOSIS — D649 Anemia, unspecified: Secondary | ICD-10-CM | POA: Insufficient documentation

## 2010-09-07 DIAGNOSIS — R5383 Other fatigue: Secondary | ICD-10-CM | POA: Insufficient documentation

## 2010-09-07 LAB — CBC
Hemoglobin: 8.8 g/dL — ABNORMAL LOW (ref 12.0–15.0)
MCH: 31 pg (ref 26.0–34.0)
Platelets: 400 10*3/uL (ref 150–400)
RBC: 2.84 MIL/uL — ABNORMAL LOW (ref 3.87–5.11)
WBC: 13.5 10*3/uL — ABNORMAL HIGH (ref 4.0–10.5)

## 2010-09-07 LAB — URINALYSIS, ROUTINE W REFLEX MICROSCOPIC
Glucose, UA: NEGATIVE mg/dL
Ketones, ur: NEGATIVE mg/dL
Nitrite: POSITIVE — AB
Protein, ur: >300 mg/dL — AB
pH: 5.5 (ref 5.0–8.0)

## 2010-09-07 LAB — BASIC METABOLIC PANEL
CO2: 22 mEq/L (ref 19–32)
Calcium: 9.1 mg/dL (ref 8.4–10.5)
Glucose, Bld: 128 mg/dL — ABNORMAL HIGH (ref 70–99)
Potassium: 4 mEq/L (ref 3.5–5.1)
Sodium: 133 mEq/L — ABNORMAL LOW (ref 135–145)

## 2010-09-07 LAB — DIFFERENTIAL
Basophils Absolute: 0 10*3/uL (ref 0.0–0.1)
Basophils Relative: 0 % (ref 0–1)
Eosinophils Absolute: 0 10*3/uL (ref 0.0–0.7)
Monocytes Relative: 9 % (ref 3–12)
Neutro Abs: 9.1 10*3/uL — ABNORMAL HIGH (ref 1.7–7.7)
Neutrophils Relative %: 68 % (ref 43–77)

## 2010-09-07 LAB — POCT I-STAT TROPONIN I: Troponin i, poc: 0 ng/mL (ref 0.00–0.08)

## 2010-09-07 LAB — URINE MICROSCOPIC-ADD ON

## 2010-09-07 LAB — OCCULT BLOOD, POC DEVICE: Fecal Occult Bld: NEGATIVE

## 2010-09-09 LAB — URINE CULTURE
Colony Count: 85000
Culture  Setup Time: 201208101705

## 2011-02-05 ENCOUNTER — Other Ambulatory Visit: Payer: Self-pay | Admitting: Endocrinology

## 2011-02-05 DIAGNOSIS — E041 Nontoxic single thyroid nodule: Secondary | ICD-10-CM

## 2011-04-08 ENCOUNTER — Ambulatory Visit
Admission: RE | Admit: 2011-04-08 | Discharge: 2011-04-08 | Disposition: A | Discharge status: Home / Self Care | Payer: Medicare Other | Source: Ambulatory Visit | Attending: Endocrinology | Admitting: Endocrinology

## 2011-04-08 DIAGNOSIS — E041 Nontoxic single thyroid nodule: Secondary | ICD-10-CM

## 2011-08-29 DIAGNOSIS — B029 Zoster without complications: Secondary | ICD-10-CM

## 2011-08-29 HISTORY — DX: Zoster without complications: B02.9

## 2011-11-15 IMAGING — US US SOFT TISSUE HEAD/NECK
1 series · 14 of 25 positions shown · non-contrast
Comparison: None.

CLINICAL DATA: Palpable thyroid mass on examination.

THYROID ULTRASOUND
TECHNIQUE: Ultrasound examination of the thyroid gland and adjacent
soft tissues was performed.

[Series 1: us soft tissue head/neck · 0.05mm/px · 14 of 36 slices shown]
[im 1/36]
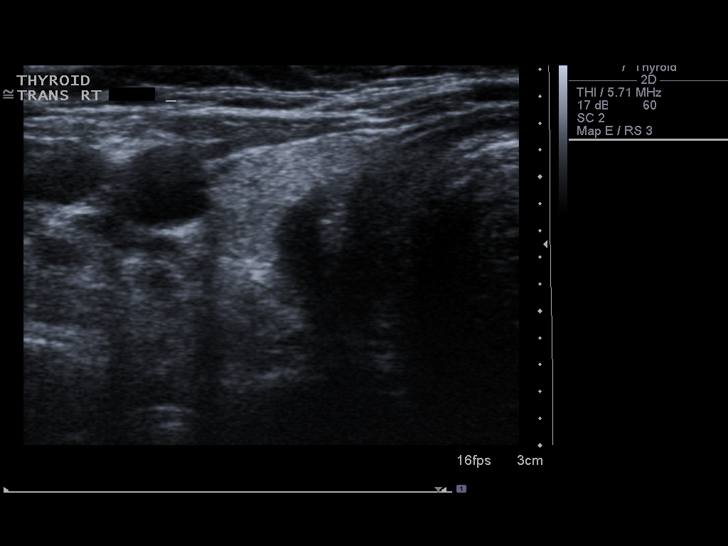
[im 3/36]
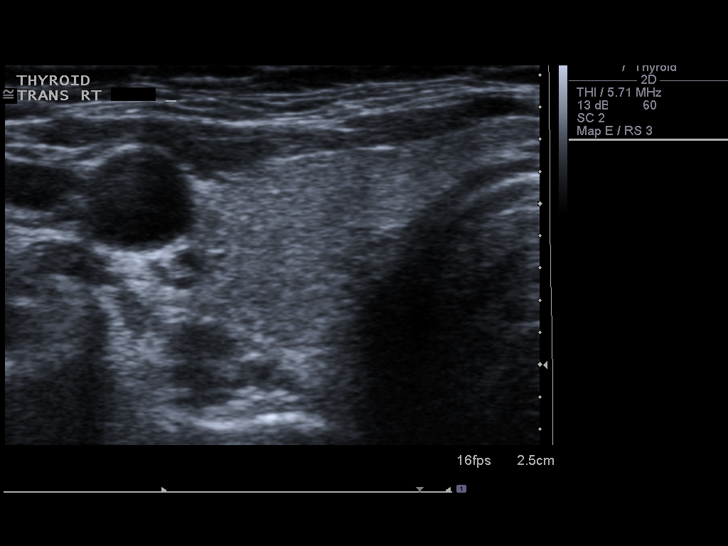
[im 6/36]
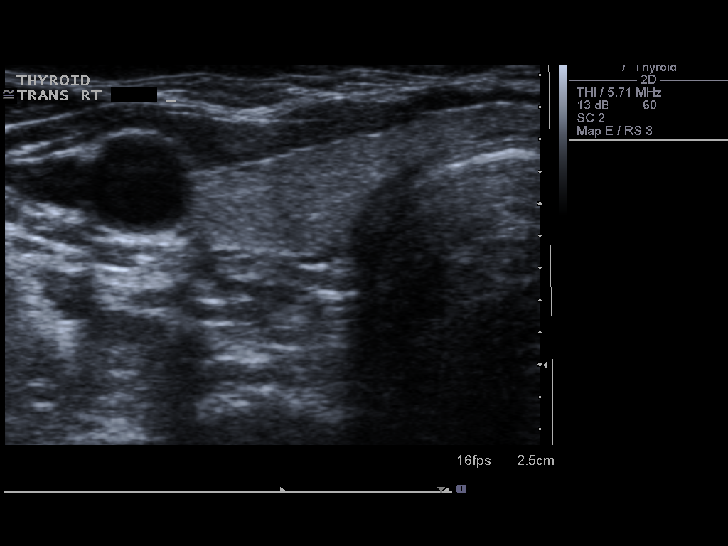
[im 9/36]
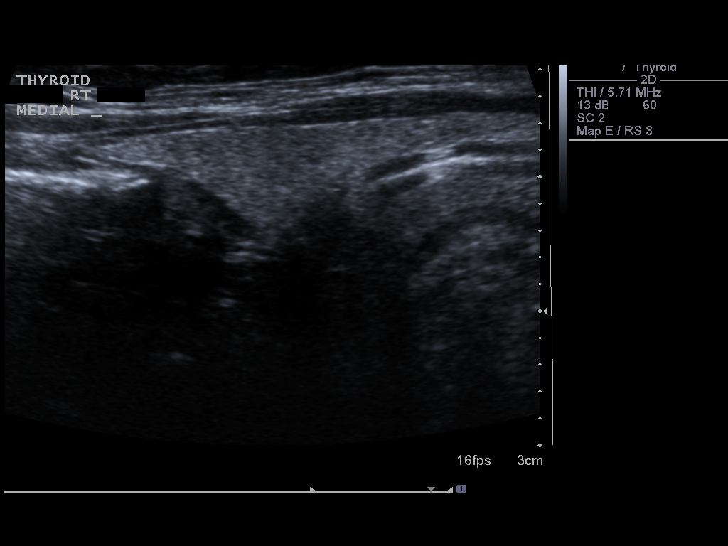
[im 12/36]
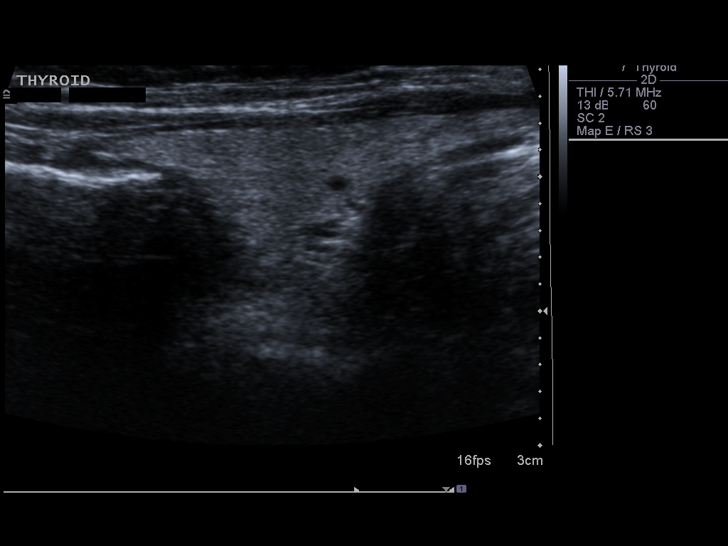
[im 14/36]
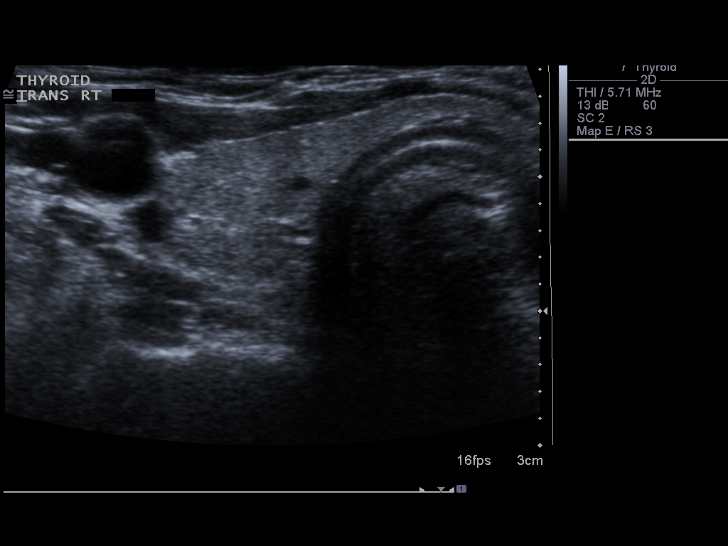
[im 17/36]
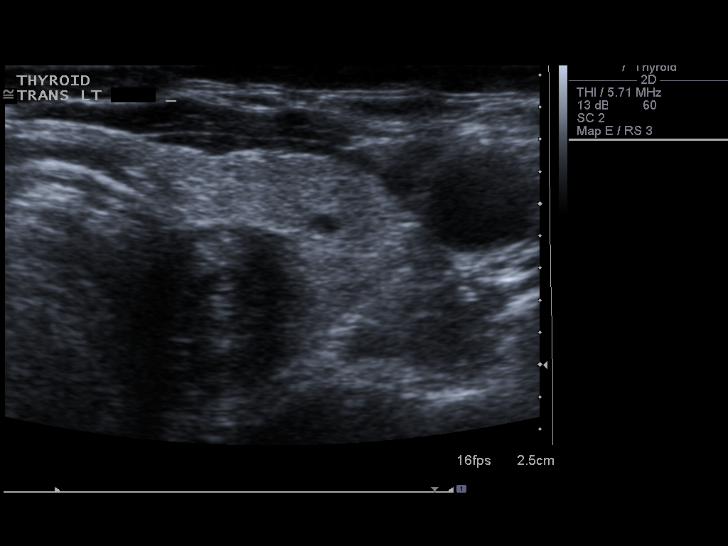
[im 19/36]
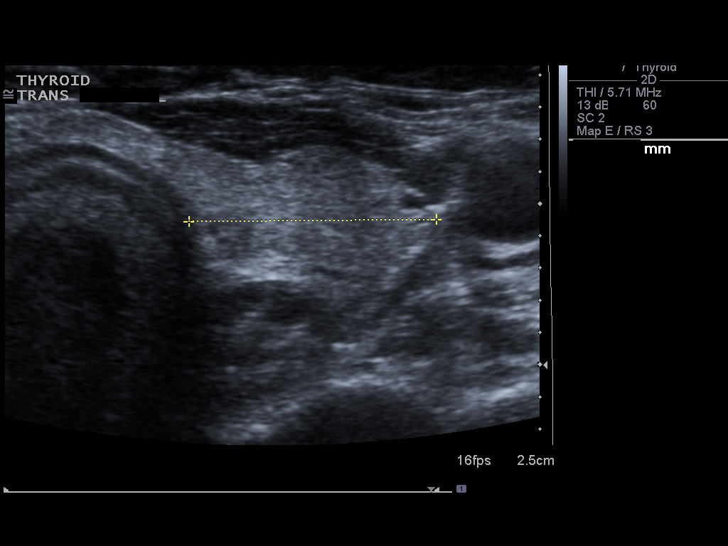
[im 22/36]
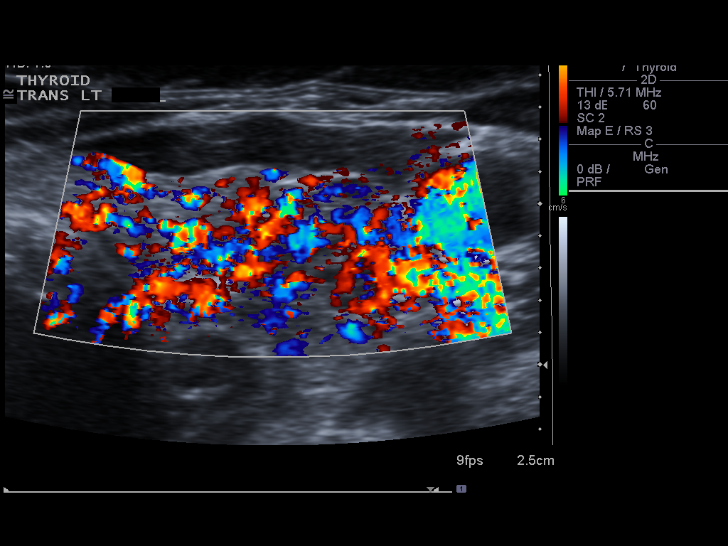
[im 24/36]
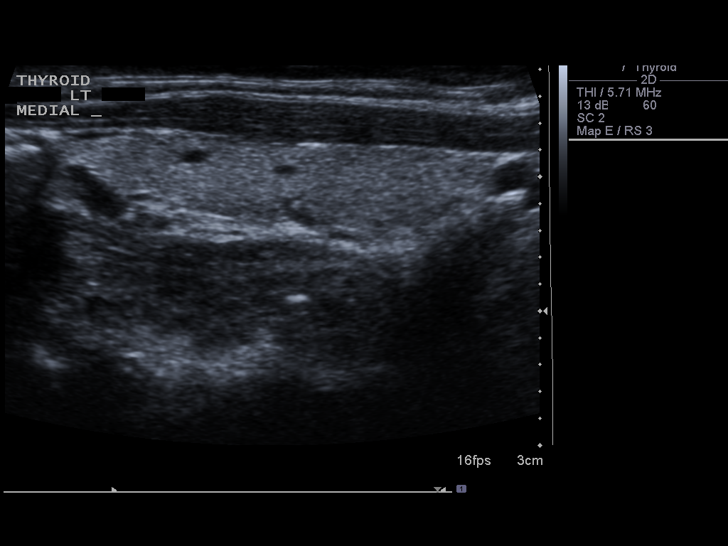
[im 27/36]
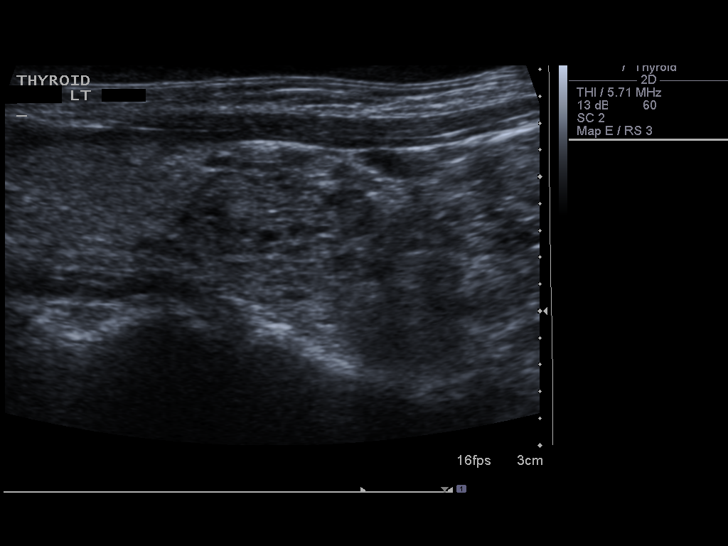
[im 30/36]
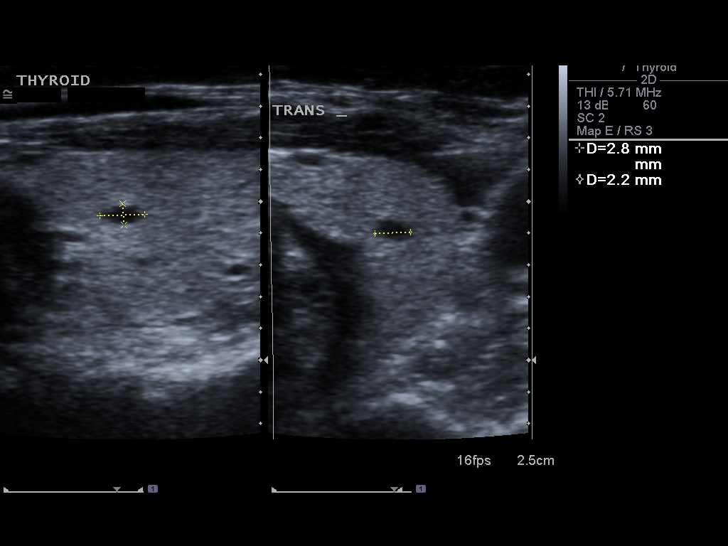
[im 33/36]
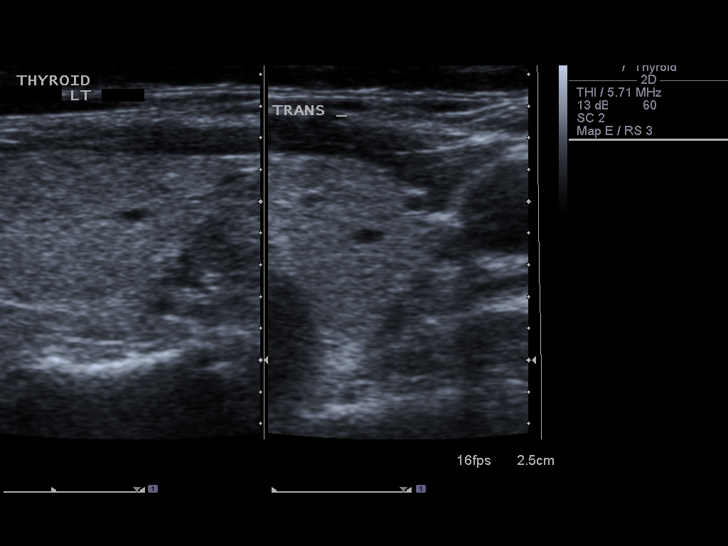
[im 36/36]
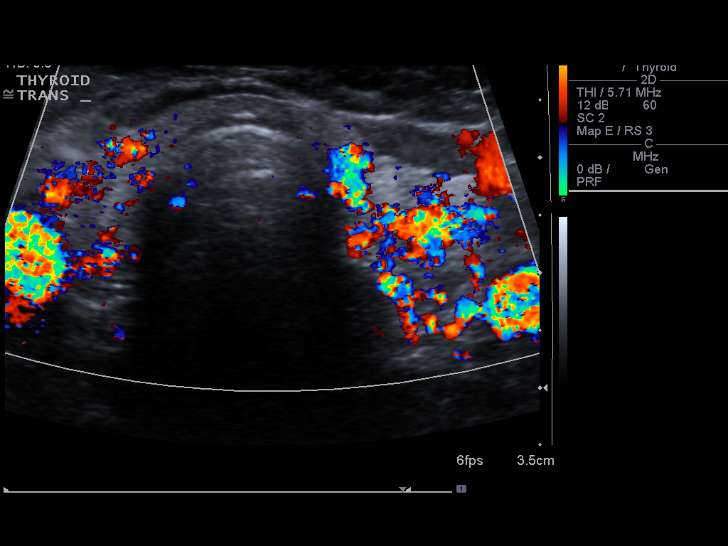

[14 of 25 positions shown; findings below may reference images not displayed]

FINDINGS: Right thyroid lobe:  4.0 x 1.1 x 1.4 cm.
Left thyroid lobe:  3.4 x 1.2 x 1.5 cm.
Isthmus:  0.1 cm.

Focal nodules:  Solid nodule is present in the left inferior
thyroid lobe measuring 1.6 x 1.2 x 1.1 cm.

Scattered tiny cysts are present.

Lymphadenopathy:  Absent
IMPRESSION: 1.6 x 1.2 x 1.1 cm left inferior thyroid pole solid nodule.  Fine
needle aspiration recommended.

## 2012-05-04 ENCOUNTER — Ambulatory Visit
Admission: RE | Admit: 2012-05-04 | Discharge: 2012-05-04 | Disposition: A | Discharge status: Home / Self Care | Payer: Medicare Other | Source: Ambulatory Visit | Attending: Family Medicine | Admitting: Family Medicine

## 2012-05-04 ENCOUNTER — Other Ambulatory Visit: Payer: Self-pay | Admitting: Family Medicine

## 2012-05-04 DIAGNOSIS — R109 Unspecified abdominal pain: Secondary | ICD-10-CM

## 2012-05-04 MED ORDER — IOHEXOL 300 MG/ML  SOLN
100.0000 mL | Freq: Once | INTRAMUSCULAR | Status: AC | PRN
  Administered 2012-05-04: 100 mL via INTRAVENOUS

## 2012-05-05 ENCOUNTER — Ambulatory Visit (INDEPENDENT_AMBULATORY_CARE_PROVIDER_SITE_OTHER): Payer: Medicare Other | Admitting: Surgery

## 2012-05-05 ENCOUNTER — Encounter (HOSPITAL_COMMUNITY): Payer: Self-pay | Admitting: Radiology

## 2012-05-05 ENCOUNTER — Observation Stay (HOSPITAL_COMMUNITY)
Admission: AD | Admit: 2012-05-05 | Discharge: 2012-05-07 | Disposition: A | Discharge status: Home / Self Care | Payer: Medicare Other | Source: Ambulatory Visit | Attending: General Surgery | Admitting: General Surgery

## 2012-05-05 ENCOUNTER — Encounter (INDEPENDENT_AMBULATORY_CARE_PROVIDER_SITE_OTHER): Payer: Self-pay | Admitting: Surgery

## 2012-05-05 ENCOUNTER — Other Ambulatory Visit: Payer: Medicare Other

## 2012-05-05 VITALS — BP 110/68 | HR 72 | Temp 96.8°F | Resp 14 | Ht 62.0 in | Wt 128.0 lb

## 2012-05-05 DIAGNOSIS — R1012 Left upper quadrant pain: Secondary | ICD-10-CM

## 2012-05-05 DIAGNOSIS — I10 Essential (primary) hypertension: Secondary | ICD-10-CM | POA: Insufficient documentation

## 2012-05-05 DIAGNOSIS — L03319 Cellulitis of trunk, unspecified: Principal | ICD-10-CM | POA: Insufficient documentation

## 2012-05-05 DIAGNOSIS — R1032 Left lower quadrant pain: Secondary | ICD-10-CM | POA: Insufficient documentation

## 2012-05-05 DIAGNOSIS — L02219 Cutaneous abscess of trunk, unspecified: Principal | ICD-10-CM | POA: Insufficient documentation

## 2012-05-05 DIAGNOSIS — R188 Other ascites: Secondary | ICD-10-CM

## 2012-05-05 DIAGNOSIS — Z79899 Other long term (current) drug therapy: Secondary | ICD-10-CM | POA: Insufficient documentation

## 2012-05-05 DIAGNOSIS — G35 Multiple sclerosis: Secondary | ICD-10-CM | POA: Insufficient documentation

## 2012-05-05 HISTORY — DX: Headache: R51

## 2012-05-05 HISTORY — DX: Other ascites: R18.8

## 2012-05-05 HISTORY — DX: Personal history of urinary (tract) infections: Z87.440

## 2012-05-05 HISTORY — DX: Personal history of other medical treatment: Z92.89

## 2012-05-05 HISTORY — DX: Zoster without complications: B02.9

## 2012-05-05 HISTORY — DX: Other specified health status: Z78.9

## 2012-05-05 LAB — COMPREHENSIVE METABOLIC PANEL
ALT: 11 U/L (ref 0–35)
BUN: 14 mg/dL (ref 6–23)
Calcium: 9.8 mg/dL (ref 8.4–10.5)
GFR calc Af Amer: >90 mL/min (ref >90)
Glucose, Bld: 114 mg/dL — ABNORMAL HIGH (ref 70–99)
Sodium: 137 mEq/L (ref 135–145)
Total Protein: 7.8 g/dL (ref 6.0–8.3)

## 2012-05-05 LAB — URINALYSIS, ROUTINE W REFLEX MICROSCOPIC
Protein, ur: NEGATIVE mg/dL
Urobilinogen, UA: 0.2 mg/dL (ref 0.0–1.0)

## 2012-05-05 LAB — CBC WITH DIFFERENTIAL/PLATELET
Basophils Relative: 0 % (ref 0–1)
Eosinophils Absolute: 0 10*3/uL (ref 0.0–0.7)
Eosinophils Relative: 0 % (ref 0–5)
Lymphs Abs: 2.1 10*3/uL (ref 0.7–4.0)
MCH: 31.7 pg (ref 26.0–34.0)
MCHC: 34.3 g/dL (ref 30.0–36.0)
MCV: 92.5 fL (ref 78.0–100.0)
Platelets: 600 10*3/uL — ABNORMAL HIGH (ref 150–400)
RBC: 3.34 MIL/uL — ABNORMAL LOW (ref 3.87–5.11)

## 2012-05-05 LAB — URINE MICROSCOPIC-ADD ON

## 2012-05-05 LAB — PROTIME-INR: Prothrombin Time: 13.5 seconds (ref 11.6–15.2)

## 2012-05-05 MED ORDER — HEPARIN SODIUM (PORCINE) 5000 UNIT/ML IJ SOLN
5000.0000 [IU] | Freq: Three times a day (TID) | INTRAMUSCULAR | Status: DC
  Administered 2012-05-05 – 2012-05-07 (×4): 5000 [IU] via SUBCUTANEOUS
  Filled 2012-05-05 (×7): qty 1

## 2012-05-05 MED ORDER — CIPROFLOXACIN IN D5W 400 MG/200ML IV SOLN
400.0000 mg | Freq: Two times a day (BID) | INTRAVENOUS | Status: DC
  Administered 2012-05-05 – 2012-05-06 (×2): 400 mg via INTRAVENOUS
  Filled 2012-05-05 (×3): qty 200

## 2012-05-05 MED ORDER — OXYCODONE HCL 5 MG PO TABS
5.0000 mg | ORAL_TABLET | ORAL | Status: DC | PRN
  Administered 2012-05-06: 5 mg via ORAL
  Filled 2012-05-05 (×2): qty 1

## 2012-05-05 MED ORDER — ONDANSETRON HCL 4 MG/2ML IJ SOLN
4.0000 mg | Freq: Three times a day (TID) | INTRAMUSCULAR | Status: DC | PRN
  Administered 2012-05-06 (×2): 4 mg via INTRAVENOUS
  Filled 2012-05-05 (×2): qty 2

## 2012-05-05 MED ORDER — OXYBUTYNIN CHLORIDE 5 MG PO TABS
5.0000 mg | ORAL_TABLET | Freq: Two times a day (BID) | ORAL | Status: DC
  Administered 2012-05-05 – 2012-05-07 (×4): 5 mg via ORAL
  Filled 2012-05-05 (×5): qty 1

## 2012-05-05 MED ORDER — LISINOPRIL 10 MG PO TABS
10.0000 mg | ORAL_TABLET | Freq: Every day | ORAL | Status: DC
  Administered 2012-05-05 – 2012-05-07 (×3): 10 mg via ORAL
  Filled 2012-05-05 (×3): qty 1

## 2012-05-05 MED ORDER — PANTOPRAZOLE SODIUM 40 MG IV SOLR
40.0000 mg | Freq: Every day | INTRAVENOUS | Status: DC
  Administered 2012-05-05 – 2012-05-06 (×2): 40 mg via INTRAVENOUS
  Filled 2012-05-05 (×3): qty 40

## 2012-05-05 NOTE — Consult Note (Signed)
HPI: [name] is an 57 y.o. female who has been found to have a large fluid collection in her LUQ abdominal wall. She was seen by Dr. Jamey Ripa earlier today and has now been admitted for IV antibiotics. He has also requested IR attempt to aspirate or drain this collection. The etiology/source of the fluid collection is unknown. No known trauma, bites, or injury. She does give herself Betaseron injections for MS every other day, but doesn't remember giving herself an injection in that location. PMHx and meds reviewed.   Past Medical History:  Past Medical History  Diagnosis Date  . Hypertension   . Multiple sclerosis     Surgical History:  Past Surgical History  Procedure Laterality Date  . Abdominal hysterectomy  2011    Family History:  Family History  Problem Relation Age of Onset  . Heart disease Mother   . Heart disease Father   . Heart disease Brother     Social History:  reports that she has never smoked. She does not have any smokeless tobacco history on file. She reports that she does not drink alcohol or use illicit drugs.  Allergies: No Known Allergies  Medications:  Prior to Admission:  Prescriptions prior to admission  Medication Sig Dispense Refill  . Calcium Carbonate-Vitamin D (CALCIUM 600 + D PO) Take by mouth.      . ferrous sulfate 325 (65 FE) MG tablet Take 325 mg by mouth daily with breakfast.      . HYDROcodone-acetaminophen (NORCO/VICODIN) 5-325 MG per tablet Take 1 tablet by mouth every 6 (six) hours as needed for pain.      . Interferon Beta-1b (BETASERON) 0.3 MG KIT injection Inject 0.25 mg into the skin every other day.      . lisinopril (PRINIVIL,ZESTRIL) 10 MG tablet Take 10 mg by mouth daily.      . Misc Natural Products (OSTEO BI-FLEX ADV JOINT SHIELD PO) Take by mouth.      . Multiple Vitamin (MULTIVITAMIN) tablet Take 1 tablet by mouth daily.      . ondansetron (ZOFRAN) 4 MG tablet Take 4 mg by mouth every 8 (eight) hours as needed for  nausea.      Marland Kitchen oxybutynin (DITROPAN) 5 MG tablet Take 5 mg by mouth 2 (two) times daily.        ROS: See HPI for pertinent findings, otherwise complete 10 system review negative.  Physical Exam: Blood pressure 155/70, pulse 70, temperature 97.9 F (36.6 C), temperature source Oral, resp. rate 15, SpO2 100.00%. ENT: clear airway Lungs: CTA without w/r/r Heart: Regular Abdomen: palpable tender fullness LUQ  Labs: Pending   Ct Abdomen Pelvis W Contrast  05/04/2012  *RADIOLOGY REPORT*  Clinical Data: Left upper and lower quadrant abdominal pain with nausea and abdominal distension.  CT ABDOMEN AND PELVIS WITH CONTRAST  Technique:  Multidetector CT imaging of the abdomen and pelvis was performed following the standard protocol during bolus administration of intravenous contrast.  Contrast: OMNIPAQUE IOHEXOL 300 MG/ML  SOLN  Comparison: Abdominal pelvic CT 09/13/2010.  Findings: The lung bases are clear.  There is no pleural effusion.  There is a new large ill-defined low density fluid collection within the left anterior abdominal wall.  This demonstrates peripheral enhancement and measures approximately 8.2 x 3.8 cm transverse and up to 13.7 cm cephalocaudad.  No intraperitoneal extension of this process is identified.  There is no evidence of associated hernia, foreign body, rib fracture or sinus tract.  No  significant abnormalities of the liver, gallbladder, biliary system, pancreas, spleen, adrenal glands or kidneys are seen.  The bowel gas pattern is normal.  The appendix appears normal.  There are no enlarged abdominal pelvic lymph nodes.  The uterus is surgically absent.  There is diffuse bladder wall thickening with possible perivesical inflammatory change.  There is no extraluminal fluid collection or hydronephrosis.  The bladder findings are similar to the prior study and may be related to the reported history of chronic cystitis.  There are grossly stable compression deformities at T12  and L1.  IMPRESSION:  1.  New peripherally enhancing fluid collection within the left anterior abdominal wall is nonspecific in appearance but may reflect a subacute hematoma if the patient has had prior trauma to this area.  A superficial abscess cannot be excluded. 2.  No intra-abdominal extension or associated hernia identified. 3.  Chronic bladder wall thickening and perivesical inflammatory change consistent with chronic cystitis, similar to prior examination. 4.  Chronic compression deformities near the thoracolumbar junction.   Original Report Authenticated By: Carey Bullocks, M.D.     Assessment/Plan: LUQ abdominal wall fluid collection suspicious for abscess Imaging reviewed with Dr. Fredia Sorrow, who recommends CT guided aspiration and likely drain placement so that collection can be completely evacuated. Discussed procedure with pt and husband, including risks, complications, and use of sedation. Labs pending Consent signed in chart  Brayton El PA-C 05/05/2012, 4:05 PM

## 2012-05-05 NOTE — Consult Note (Signed)
Agree with above.  The collection looks like an abscess by CT and most likely would benefit from placement of an indwelling drain for at least a few days.  Will proceed in AM with CT guided drainage.

## 2012-05-05 NOTE — Patient Instructions (Signed)
We will admit youto the hospital for control of pain and drainage of the fluid collection

## 2012-05-05 NOTE — Progress Notes (Signed)
Patient admitted to 6N09.  Oriented to room and call light.  Husband at bedside.  Vital signs stable.  Will continue to monitor.

## 2012-05-05 NOTE — Progress Notes (Signed)
Patient ID: [name], female   DOB: 1955-02-23, 57 y.o.   MRN: [id number]  Chief Complaint  Patient presents with  . Abdominal Pain    HPI [name] is a 57 y.o. female.  She is referred for office for evaluation of a left upper quadrant fluid-filled collection. She has had pain for about the last 2 weeks the left upper and mid quadrant. It seems to radiate up under her breasts. Has been going on for 2 weeks and is getting worse. She was treated with antibiotics 2 weeks ago with the possibility that she had a urinary tract infection. She had a second course and was seen by her urologist but they decided there was no infection. After her CT yesterday showed a fluid collection she was started on Cipro and hydrocodone. She's had nausea since then. She continues to feel bad. She is continued to feel worse and overall just appears miserable.  HPI  Past Medical History  Diagnosis Date  . Hypertension   . Multiple sclerosis     Past Surgical History  Procedure Laterality Date  . Abdominal hysterectomy  2011    Family History  Problem Relation Age of Onset  . Heart disease Mother   . Heart disease Father   . Heart disease Brother     Social History History  Substance Use Topics  . Smoking status: Never Smoker   . Smokeless tobacco: Not on file  . Alcohol Use: No    No Known Allergies  Current Outpatient Prescriptions  Medication Sig Dispense Refill  . Calcium Carbonate-Vitamin D (CALCIUM 600 + D PO) Take by mouth.      . ferrous sulfate 325 (65 FE) MG tablet Take 325 mg by mouth daily with breakfast.      . HYDROcodone-acetaminophen (NORCO/VICODIN) 5-325 MG per tablet Take 1 tablet by mouth every 6 (six) hours as needed for pain.      . Interferon Beta-1b (BETASERON) 0.3 MG KIT injection Inject 0.25 mg into the skin every other day.      . lisinopril (PRINIVIL,ZESTRIL) 10 MG tablet Take 10 mg by mouth daily.      . Misc Natural Products (OSTEO BI-FLEX ADV JOINT SHIELD  PO) Take by mouth.      . Multiple Vitamin (MULTIVITAMIN) tablet Take 1 tablet by mouth daily.      . ondansetron (ZOFRAN) 4 MG tablet Take 4 mg by mouth every 8 (eight) hours as needed for nausea.      Marland Kitchen oxybutynin (DITROPAN) 5 MG tablet Take 5 mg by mouth 2 (two) times daily.       No current facility-administered medications for this visit.    Review of Systems Review of Systems She has filled out our form an it is negative except as in HPI  Blood pressure 110/68, pulse 72, temperature 96.8 F (36 C), temperature source Temporal, resp. rate 14, height 5\' 2"  (1.575 m), weight 128 lb (58.06 kg).  Physical Exam Physical Exam  Vitals reviewed. Constitutional: She is oriented to person, place, and time. She appears well-developed. She appears listless. She is cooperative.  Non-toxic appearance. She does not have a sickly appearance. She appears ill. No distress.  HENT:  Head: Normocephalic and atraumatic.  Mouth/Throat: Oropharynx is clear and moist.  Eyes: Conjunctivae and EOM are normal. Pupils are equal, round, and reactive to light. No scleral icterus.  Neck: Normal range of motion. Neck supple. No tracheal deviation present. No thyromegaly present.  Cardiovascular: Normal rate,  regular rhythm, normal heart sounds and intact distal pulses.  Exam reveals no gallop and no friction rub.   No murmur heard. Pulmonary/Chest: Effort normal and breath sounds normal. No respiratory distress. She has no wheezes. She has no rales.  Abdominal: Soft. Bowel sounds are normal. She exhibits no distension and no mass. There is tenderness. There is no rebound and no guarding.    Musculoskeletal: Normal range of motion. She exhibits no edema and no tenderness.  Diffuse atrophy  Neurological: She is oriented to person, place, and time. She appears listless.  Skin: Skin is warm and dry. No rash noted. She is not diaphoretic. No erythema.  Psychiatric: She has a normal mood and affect. Her behavior is  normal. Judgment and thought content normal.  She is weak (has MS)  Data Reviewed I have reviewed the office notes from her primary physician, reviewed the old medical record and chronic records, reviewed the CT scans and discuss them with the radiologist  Assessment    Fluid collection left upper quadrant of uncertain etiology with no history of trauma    There a pparently was some inflammatory change in retrospect noted on a CT in 2012 but now has a clear fluid collection.   Plan I reviewed the situation with the patient and her husband. At that point I went ahead and did an office ultrasound. I was able to visualize the fluid collection in the left subcostal area and under ultrasound guidance introduced an 18-gauge needle in and aspirated about 4 cc of clear fluid. This is sent for culture. It didn't really produce any relief in her symptoms and feels quite uncomfortable continuing. After further discussion I think it would be best to admit her for pain control, antibiotics until he had this further evaluated, and have the radiologist see if they can get this completely drained or do further evaluation. There is no evidence of any intra-abdominal process to cause her pain and I think it is all related to this fluid collection but the etiology of this collection is unclear to me.       Bader Stubblefield J 05/05/2012, 2:50 PM

## 2012-05-06 ENCOUNTER — Observation Stay (HOSPITAL_COMMUNITY): Payer: Medicare Other

## 2012-05-06 MED ORDER — CEFAZOLIN SODIUM-DEXTROSE 2-3 GM-% IV SOLR
2.0000 g | Freq: Three times a day (TID) | INTRAVENOUS | Status: DC
  Administered 2012-05-06 – 2012-05-07 (×3): 2 g via INTRAVENOUS
  Filled 2012-05-06 (×5): qty 50

## 2012-05-06 MED ORDER — ACETAMINOPHEN 325 MG PO TABS
650.0000 mg | ORAL_TABLET | Freq: Four times a day (QID) | ORAL | Status: DC | PRN
  Administered 2012-05-06 – 2012-05-07 (×2): 650 mg via ORAL
  Filled 2012-05-06 (×2): qty 2

## 2012-05-06 MED ORDER — FENTANYL CITRATE 0.05 MG/ML IJ SOLN
INTRAMUSCULAR | Status: AC | PRN
  Administered 2012-05-06 (×2): 25 ug via INTRAVENOUS

## 2012-05-06 MED ORDER — MIDAZOLAM HCL 2 MG/2ML IJ SOLN
INTRAMUSCULAR | Status: AC | PRN
  Administered 2012-05-06: 1 mg via INTRAVENOUS

## 2012-05-06 MED ORDER — PROMETHAZINE HCL 25 MG/ML IJ SOLN
25.0000 mg | Freq: Four times a day (QID) | INTRAMUSCULAR | Status: DC | PRN
  Administered 2012-05-06: 25 mg via INTRAVENOUS
  Filled 2012-05-06: qty 1

## 2012-05-06 NOTE — Procedures (Signed)
Procedure:  CT guided drainage of abdominal wall abscess Findings:  Slightly blood-tinged fluid aspirated and sample sent for culture studies.  12 Fr drain placed and attached to suction bulb.

## 2012-05-06 NOTE — Progress Notes (Signed)
Subjective: Pt feeling okay, just had her IR drainage procedure.  They left a drain which is draining serosanguinous drainage.  Pain better, but still present in the left side of her abdomen.  Pt denies N/V.  Passing flatus, no BM recently.  Pt ambulating some.  Pt thinks the fluid collection started after giving herself a MS shot in her abdomen.  She rotates locations.  Objective: Vital signs in last 24 hours: Temp:  [96.8 F (36 C)-98.9 F (37.2 C)] 98.6 F (37 C) (04/09 0506) Pulse Rate:  [70-99] 76 (04/09 0936) Resp:  [11-19] 14 (04/09 0936) BP: (110-155)/(54-70) 130/60 mmHg (04/09 0936) SpO2:  [96 %-100 %] 99 % (04/09 0940) Weight:  [128 lb (58.06 kg)] 128 lb (58.06 kg) (04/09 0300) Last BM Date: 05/04/12  Intake/Output from previous day: 04/08 0701 - 04/09 0700 In: 200 [IV Piggyback:200] Out: 1000 [Urine:1000] Intake/Output this shift:    PE: Gen:  Alert, NAD, pleasant Abd: Soft, moderate tenderness in the left abdomen ND, +BS, no HSM, incisions C/D/I, drain with minimal sanguinous drainage   Lab Results:   Recent Labs  05/05/12 1557  WBC 12.3*  HGB 10.6*  HCT 30.9*  PLT 600*   BMET  Recent Labs  05/05/12 1557  NA 137  K 4.6  CL 99  CO2 23  GLUCOSE 114*  BUN 14  CREATININE 0.65  CALCIUM 9.8   PT/INR  Recent Labs  05/05/12 1557  LABPROT 13.5  INR 1.04   CMP     Component Value Date/Time   NA 137 05/05/2012 1557   K 4.6 05/05/2012 1557   CL 99 05/05/2012 1557   CO2 23 05/05/2012 1557   GLUCOSE 114* 05/05/2012 1557   BUN 14 05/05/2012 1557   CREATININE 0.65 05/05/2012 1557   CALCIUM 9.8 05/05/2012 1557   PROT 7.8 05/05/2012 1557   ALBUMIN 3.2* 05/05/2012 1557   AST 20 05/05/2012 1557   ALT 11 05/05/2012 1557   ALKPHOS 94 05/05/2012 1557   BILITOT 0.2* 05/05/2012 1557   GFRNONAA >90 05/05/2012 1557   GFRAA >90 05/05/2012 1557   Lipase  No results found for this basename: lipase       Studies/Results: Ct Abdomen Pelvis W Contrast  05/04/2012  *RADIOLOGY  REPORT*  Clinical Data: Left upper and lower quadrant abdominal pain with nausea and abdominal distension.  CT ABDOMEN AND PELVIS WITH CONTRAST  Technique:  Multidetector CT imaging of the abdomen and pelvis was performed following the standard protocol during bolus administration of intravenous contrast.  Contrast: OMNIPAQUE IOHEXOL 300 MG/ML  SOLN  Comparison: Abdominal pelvic CT 09/13/2010.  Findings: The lung bases are clear.  There is no pleural effusion.  There is a new large ill-defined low density fluid collection within the left anterior abdominal wall.  This demonstrates peripheral enhancement and measures approximately 8.2 x 3.8 cm transverse and up to 13.7 cm cephalocaudad.  No intraperitoneal extension of this process is identified.  There is no evidence of associated hernia, foreign body, rib fracture or sinus tract.  No significant abnormalities of the liver, gallbladder, biliary system, pancreas, spleen, adrenal glands or kidneys are seen.  The bowel gas pattern is normal.  The appendix appears normal.  There are no enlarged abdominal pelvic lymph nodes.  The uterus is surgically absent.  There is diffuse bladder wall thickening with possible perivesical inflammatory change.  There is no extraluminal fluid collection or hydronephrosis.  The bladder findings are similar to the prior study and may be  related to the reported history of chronic cystitis.  There are grossly stable compression deformities at T12 and L1.  IMPRESSION:  1.  New peripherally enhancing fluid collection within the left anterior abdominal wall is nonspecific in appearance but may reflect a subacute hematoma if the patient has had prior trauma to this area.  A superficial abscess cannot be excluded. 2.  No intra-abdominal extension or associated hernia identified. 3.  Chronic bladder wall thickening and perivesical inflammatory change consistent with chronic cystitis, similar to prior examination. 4.  Chronic compression  deformities near the thoracolumbar junction.   Original Report Authenticated By: Carey Bullocks, M.D.     Anti-infectives: Anti-infectives   Start     Dose/Rate Route Frequency Ordered Stop   05/05/12 1630  ciprofloxacin (CIPRO) IVPB 400 mg     400 mg 200 mL/hr over 60 Minutes Intravenous Every 12 hours 05/05/12 1533         Assessment/Plan LUQ abdominal wall fluid collection s/p CT guided drainage Findings: serosanguineous drainage sent for cultures, drain left in place by IR  1.  WBC was 12.2 today so will keep overnight and plan for discharge tomorrow 2.  Dressing changes and drain care per IR 3.  Ambulate and IS 4.  Heparin and SCD's 5.  IVF, pain control, antibiotics (switched to ancef from cipro)    LOS: 1 day    DORT, Deloss Amico 05/06/2012, 10:29 AM Pager: [phone]

## 2012-05-07 ENCOUNTER — Encounter (HOSPITAL_COMMUNITY): Payer: Self-pay | Admitting: General Surgery

## 2012-05-07 DIAGNOSIS — R188 Other ascites: Secondary | ICD-10-CM

## 2012-05-07 HISTORY — DX: Other ascites: R18.8

## 2012-05-07 MED ORDER — PANTOPRAZOLE SODIUM 40 MG PO TBEC: 40.0000 mg | DELAYED_RELEASE_TABLET | Freq: Every day | ORAL | Status: DC

## 2012-05-07 MED ORDER — CEPHALEXIN 500 MG PO CAPS: 500.0000 mg | ORAL_CAPSULE | Freq: Three times a day (TID) | ORAL | Status: DC

## 2012-05-07 MED ORDER — ACETAMINOPHEN 325 MG PO TABS: 650.0000 mg | ORAL_TABLET | Freq: Four times a day (QID) | ORAL | Status: DC | PRN

## 2012-05-07 MED ORDER — OXYCODONE HCL 5 MG PO TABS: 5.0000 mg | ORAL_TABLET | ORAL | Status: DC | PRN

## 2012-05-07 NOTE — Discharge Summary (Signed)
Agree with above, I am out next week so will follow up with Dr Jamey Ripa

## 2012-05-07 NOTE — Progress Notes (Signed)
Subjective: Pt feels much better since drain placed yesterday. Less pain. Afebrile  Objective: Physical Exam: BP 132/58  Pulse 97  Temp(Src) 98.2 F (36.8 C) (Oral)  Resp 16  Ht 5\' 2"  (1.575 m)  Wt 128 lb (58.06 kg)  BMI 23.41 kg/m2  SpO2 99% Drain in LUQ intact, site clean. Serosanguinous output. Only 25cc recorded yesterday but pt says it has been emptied twice   Labs: CBC  Recent Labs  05/05/12 1557  WBC 12.3*  HGB 10.6*  HCT 30.9*  PLT 600*   BMET  Recent Labs  05/05/12 1557  NA 137  K 4.6  CL 99  CO2 23  GLUCOSE 114*  BUN 14  CREATININE 0.65  CALCIUM 9.8   LFT  Recent Labs  05/05/12 1557  PROT 7.8  ALBUMIN 3.2*  AST 20  ALT 11  ALKPHOS 94  BILITOT 0.2*   PT/INR  Recent Labs  05/05/12 1557  LABPROT 13.5  INR 1.04     Studies/Results: Ct Image Guided Fluid Drain By Catheter  05/06/2012  *RADIOLOGY REPORT*  Clinical Data: Left abdominal wall fluid collection requiring drainage.  CT GUIDED DRAINAGE OF LEFT ABDOMINAL WALL ABSCESS  Sedation:  1.0 mg IV Versed;  50 mcg IV Fentanyl  Total Moderate Sedation Time: 22 minutes.  Procedure:  The procedure, risks, benefits, and alternatives were explained to the patient.  Questions regarding the procedure were encouraged and answered. The patient understands and consents to the procedure.  The abdominal was prepped with Betadine in a sterile fashion, and a sterile drape was applied covering the operative field.  A sterile gown and sterile gloves were used for the procedure. Local anesthesia was provided with 1% Lidocaine.  CT was performed in a supine position.  Under CT guidance, an 18 gauge trocar needle was advanced into the left anterior abdominal wall collection.  Fluid was aspirated and a sample sent for culture analysis.  A guidewire was advanced into the collection.  The tract was dilated and a 12-French drainage catheter placed.  Catheter position was confirmed by CT.  The catheter was flushed and  connected to a suction bulb.  It was secured at the skin with a Prolene retention suture and adhesive Stat-Lock device.  Complications: None  Findings: Aspirated fluid was blood tinged and relatively clear.  A drainage catheter was placed and is formed in the collection. Output will be followed.  IMPRESSION: CT guided drainage of the left anterior abdominal wall fluid collection.  Blood tinged fluid sample was sent for culture studies.  A 12-French drain was placed and attached to a suction bulb.   Original Report Authenticated By: Irish Lack, M.D.     Assessment/Plan: S/p perc drain of LUQ abdominal wall fluid collection Cx pending but (-) so far. Will follow, other plans per [facility] Rec repeat CT when output <10cc/day before removal, which can be done as outpt.    LOS: 2 days    Brayton El PA-C 05/07/2012 8:53 AM

## 2012-05-07 NOTE — Progress Notes (Signed)
Agree with above, will need to follow up culture results

## 2012-05-07 NOTE — Discharge Summary (Signed)
Physician Discharge Summary  Patient ID: [name] MRN: [id number] DOB/AGE: 1955/09/14 57 y.o.  Admit date: 05/05/2012 Discharge date: 05/07/2012  Admission Diagnoses: LUQ abdominal wall fluid collection s/p CT guided drainage 05/06/12 Dr. Fredia Sorrow, IR.  Multiple Sclerosis with Betaseron injection   Discharge Diagnoses: Same Active Problems:   Abdominal fluid collection   PROCEDURES: IR CT Guided Abscess Drain 05/06/12 IR, Dr. Fredia Sorrow.  Hospital Course: Pt was seen in our office by Dr. Jamey Ripa and found to have a fluid collection in the LUQ. She was admitted for evaluation and CT guided drainage by IR. The cultures and gram stain are negative so far.  She feels much better this Am.  Site looks fine. She is on Ancef so I will send her home on Keflex.  She will follow up next week with Dr. Jamey Ripa.  Sooner if she has a problem.  Condition on d/c:  Improved.   Disposition: 01-Home or Self Care       Future Appointments Provider Department Dept Phone   05/12/2012 4:40 PM Currie Paris, MD [facility], [place] [phone]       Medication List    TAKE these medications       acetaminophen 325 MG tablet  Commonly known as:  TYLENOL  Take 2 tablets (650 mg total) by mouth every 6 (six) hours as needed.     BETASERON 0.3 MG Kit injection  Generic drug:  Interferon Beta-1b  Inject 0.25 mg into the skin every other day.     CALCIUM 600 + D PO  Take 1 tablet by mouth daily.     cephALEXin 500 MG capsule  Commonly known as:  KEFLEX  Take 1 capsule (500 mg total) by mouth every 8 (eight) hours.     ferrous sulfate 325 (65 FE) MG tablet  Take 325 mg by mouth daily with breakfast.     lisinopril 10 MG tablet  Commonly known as:  PRINIVIL,ZESTRIL  Take 10 mg by mouth daily.     multivitamin tablet  Take 1 tablet by mouth daily.     ondansetron 4 MG tablet  Commonly known as:  ZOFRAN  Take 4 mg by mouth every 8 (eight) hours as needed for nausea.     OSTEO  BI-FLEX ADV JOINT SHIELD PO  Take 1 tablet by mouth daily.     oxybutynin 5 MG tablet  Commonly known as:  DITROPAN  Take 5 mg by mouth 2 (two) times daily.     oxyCODONE 5 MG immediate release tablet  Commonly known as:  Oxy IR/ROXICODONE  Take 1 tablet (5 mg total) by mouth every 4 (four) hours as needed.       Follow-up Information   Follow up with Currie Paris, MD On 05/12/2012. (Be at the office at 4:15 pm for check in and see Dr. Jamey Ripa at 4:40 pm)    Contact information:   [address] [place] [address] [phone]       Signed: Sherrie George 05/07/2012, 10:02 AM

## 2012-05-07 NOTE — Progress Notes (Signed)
Subjective: Feels much better, no redness, husband and pt not sure there was any prior to drain.  Objective: Vital signs in last 24 hours: Temp:  [97.6 F (36.4 C)-98.5 F (36.9 C)] 98.2 F (36.8 C) (04/10 0522) Pulse Rate:  [72-97] 97 (04/10 0522) Resp:  [11-19] 16 (04/10 0522) BP: (118-137)/(49-60) 132/58 mmHg (04/10 0522) SpO2:  [97 %-100 %] 99 % (04/10 0522) Last BM Date: 05/04/12 Diet: regular,  Afebrile, VSS, no labs. Gram stain shows, no organisms, no growth on cultures  Intake/Output from previous day: 04/09 0701 - 04/10 0700 In: 100 [IV Piggyback:100] Out: 25 [Drains:25] Intake/Output this shift:    General appearance: alert, cooperative and no distress Skin: Drain site is much better, no pain no erythema.  Lab Results:   Recent Labs  05/05/12 1557  WBC 12.3*  HGB 10.6*  HCT 30.9*  PLT 600*    BMET  Recent Labs  05/05/12 1557  NA 137  K 4.6  CL 99  CO2 23  GLUCOSE 114*  BUN 14  CREATININE 0.65  CALCIUM 9.8   PT/INR  Recent Labs  05/05/12 1557  LABPROT 13.5  INR 1.04     Recent Labs Lab 05/05/12 1557  AST 20  ALT 11  ALKPHOS 94  BILITOT 0.2*  PROT 7.8  ALBUMIN 3.2*     Lipase  No results found for this basename: lipase     Studies/Results: Ct Image Guided Fluid Drain By Catheter  05/06/2012  *RADIOLOGY REPORT*  Clinical Data: Left abdominal wall fluid collection requiring drainage.  CT GUIDED DRAINAGE OF LEFT ABDOMINAL WALL ABSCESS  Sedation:  1.0 mg IV Versed;  50 mcg IV Fentanyl  Total Moderate Sedation Time: 22 minutes.  Procedure:  The procedure, risks, benefits, and alternatives were explained to the patient.  Questions regarding the procedure were encouraged and answered. The patient understands and consents to the procedure.  The abdominal was prepped with Betadine in a sterile fashion, and a sterile drape was applied covering the operative field.  A sterile gown and sterile gloves were used for the procedure. Local  anesthesia was provided with 1% Lidocaine.  CT was performed in a supine position.  Under CT guidance, an 18 gauge trocar needle was advanced into the left anterior abdominal wall collection.  Fluid was aspirated and a sample sent for culture analysis.  A guidewire was advanced into the collection.  The tract was dilated and a 12-French drainage catheter placed.  Catheter position was confirmed by CT.  The catheter was flushed and connected to a suction bulb.  It was secured at the skin with a Prolene retention suture and adhesive Stat-Lock device.  Complications: None  Findings: Aspirated fluid was blood tinged and relatively clear.  A drainage catheter was placed and is formed in the collection. Output will be followed.  IMPRESSION: CT guided drainage of the left anterior abdominal wall fluid collection.  Blood tinged fluid sample was sent for culture studies.  A 12-French drain was placed and attached to a suction bulb.   Original Report Authenticated By: Irish Lack, M.D.     Medications: .  ceFAZolin (ANCEF) IV  2 g Intravenous Q8H  . heparin  5,000 Units Subcutaneous Q8H  . lisinopril  10 mg Oral Daily  . oxybutynin  5 mg Oral BID  . pantoprazole (PROTONIX) IV  40 mg Intravenous QHS    Assessment/Plan LUQ abdominal wall fluid collection s/p CT guided drainage 05/06/12 Dr. Fredia Sorrow, IR. Multiple Sclerosis with Betaseron injection  Plan:  I hope to sen her home this Am.  Follow up in office next week with Dr. Jamey Ripa. Doing well on Ancef so I will change her to oral Keflex.    LOS: 2 days    [name] 05/07/2012

## 2012-05-08 LAB — WOUND CULTURE
Gram Stain: NONE SEEN
Organism ID, Bacteria: NO GROWTH

## 2012-05-09 LAB — CULTURE, ROUTINE-ABSCESS: Culture: NO GROWTH

## 2012-05-10 NOTE — H&P (Signed)
Currie Paris, MD Physician Signed  Progress Notes Service date: 05/05/2012 2:50 PM  Related encounter: Office Visit from 05/05/2012 in [facility], [place]  Patient ID: [name], female   DOB: [birth year], 57 y.o.   MRN: [id number]    Chief Complaint   Patient presents with   .  Abdominal Pain      HPI [name] is a 57 y.o. female.  She is referred for office for evaluation of a left upper quadrant fluid-filled collection. She has had pain for about the last 2 weeks the left upper and mid quadrant. It seems to radiate up under her breasts. Has been going on for 2 weeks and is getting worse. She was treated with antibiotics 2 weeks ago with the possibility that she had a urinary tract infection. She had a second course and was seen by her urologist but they decided there was no infection. After her CT yesterday showed a fluid collection she was started on Cipro and hydrocodone. She's had nausea since then. She continues to feel bad. She is continued to feel worse and overall just appears miserable.  HPI    Past Medical History   Diagnosis  Date   .  Hypertension     .  Multiple sclerosis         Past Surgical History   Procedure  Laterality  Date   .  Abdominal hysterectomy    2011       Family History   Problem  Relation  Age of Onset   .  Heart disease  Mother     .  Heart disease  Father     .  Heart disease  Brother        Social History History   Substance Use Topics   .  Smoking status:  Never Smoker    .  Smokeless tobacco:  Not on file   .  Alcohol Use:  No      No Known Allergies    Current Outpatient Prescriptions   Medication  Sig  Dispense  Refill   .  Calcium Carbonate-Vitamin D (CALCIUM 600 + D PO)  Take by mouth.         .  ferrous sulfate 325 (65 FE) MG tablet  Take 325 mg by mouth daily with breakfast.         .  HYDROcodone-acetaminophen (NORCO/VICODIN) 5-325 MG per tablet  Take 1 tablet by mouth every 6 (six) hours as needed for  pain.         .  Interferon Beta-1b (BETASERON) 0.3 MG KIT injection  Inject 0.25 mg into the skin every other day.         .  lisinopril (PRINIVIL,ZESTRIL) 10 MG tablet  Take 10 mg by mouth daily.         .  Misc Natural Products (OSTEO BI-FLEX ADV JOINT SHIELD PO)  Take by mouth.         .  Multiple Vitamin (MULTIVITAMIN) tablet  Take 1 tablet by mouth daily.         .  ondansetron (ZOFRAN) 4 MG tablet  Take 4 mg by mouth every 8 (eight) hours as needed for nausea.         Marland Kitchen  oxybutynin (DITROPAN) 5 MG tablet  Take 5 mg by mouth 2 (two) times daily.             No current facility-administered medications for this visit.  Review of Systems Review of Systems She has filled out our form an it is negative except as in HPI   Blood pressure 110/68, pulse 72, temperature 96.8 F (36 C), temperature source Temporal, resp. rate 14, height 5\' 2"  (1.575 m), weight 128 lb (58.06 kg).   Physical Exam Physical Exam  Vitals reviewed. Constitutional: She is oriented to person, place, and time. She appears well-developed. She appears listless. She is cooperative.  Non-toxic appearance. She does not have a sickly appearance. She appears ill. No distress.  HENT:   Head: Normocephalic and atraumatic.   Mouth/Throat: Oropharynx is clear and moist.  Eyes: Conjunctivae and EOM are normal. Pupils are equal, round, and reactive to light. No scleral icterus.  Neck: Normal range of motion. Neck supple. No tracheal deviation present. No thyromegaly present.  Cardiovascular: Normal rate, regular rhythm, normal heart sounds and intact distal pulses.  Exam reveals no gallop and no friction rub.    No murmur heard. Pulmonary/Chest: Effort normal and breath sounds normal. No respiratory distress. She has no wheezes. She has no rales.  Abdominal: Soft. Bowel sounds are normal. She exhibits no distension and no mass. There is tenderness. There is no rebound and no guarding.    Musculoskeletal: Normal range  of motion. She exhibits no edema and no tenderness.  Diffuse atrophy  Neurological: She is oriented to person, place, and time. She appears listless.  Skin: Skin is warm and dry. No rash noted. She is not diaphoretic. No erythema.  Psychiatric: She has a normal mood and affect. Her behavior is normal. Judgment and thought content normal.  She is weak (has MS)   Data Reviewed I have reviewed the office notes from her primary physician, reviewed the old medical record and chronic records, reviewed the CT scans and discuss them with the radiologist   Assessment Fluid collection left upper quadrant of uncertain etiology with no history of trauma  There a pparently was some inflammatory change in retrospect noted on a CT in 2012 but now has a clear fluid collection.    Plan I reviewed the situation with the patient and her husband. At that point I went ahead and did an office ultrasound. I was able to visualize the fluid collection in the left subcostal area and under ultrasound guidance introduced an 18-gauge needle in and aspirated about 4 cc of clear fluid. This is sent for culture. It didn't really produce any relief in her symptoms and feels quite uncomfortable continuing. After further discussion I think it would be best to admit her for pain control, antibiotics until he had this further evaluated, and have the radiologist see if they can get this completely drained or do further evaluation. There is no evidence of any intra-abdominal process to cause her pain and I think it is all related to this fluid collection but the etiology of this collection is unclear to me.       STRECK,CHRISTIAN J 05/05/2012, 2:50 PM   I did not see this patient till she was ready for d/c:  This is Dr. Tenna Child note.

## 2012-05-11 LAB — ANAEROBIC CULTURE

## 2012-05-12 ENCOUNTER — Encounter (INDEPENDENT_AMBULATORY_CARE_PROVIDER_SITE_OTHER): Payer: Self-pay | Admitting: Surgery

## 2012-05-12 ENCOUNTER — Ambulatory Visit (INDEPENDENT_AMBULATORY_CARE_PROVIDER_SITE_OTHER): Payer: Medicare Other | Admitting: Surgery

## 2012-05-12 VITALS — BP 116/86 | HR 96 | Resp 16 | Ht 61.0 in | Wt 128.0 lb

## 2012-05-12 DIAGNOSIS — R188 Other ascites: Secondary | ICD-10-CM

## 2012-05-12 NOTE — Progress Notes (Signed)
NAMEITHZEL FEDORCHAK       DOB: 28-Dec-1955           DATE: 05/12/2012       ZOX:[id number]  CC:  Chief Complaint  Patient presents with  . Follow-up    reck abd abs drain    HPI: she is seen in the urgent office last week with a large left subcostal fluid collection. She was admitted to the hospital and this was drained percutaneously. This significantly improved her pain and she is basically pain free. The fluid continues to drain about 15 cc per day. He continues to be clear. There was no growth on cultures. She continues on antibiotics. She overall feels much improved.  EXAM: Vital signs: BP 116/86  Pulse 96  Resp 16  Ht 5\' 1"  (1.549 m)  Wt 128 lb (58.06 kg)  BMI 24.2 kg/m2  SpO2 97%  General: Patient alert, oriented, NAD  Abdomen: Soft and completely benign. There is a drain present in the left upper quadrant. The bulb has serous fluid. IMP: did well status post drainage of abdominal fluid collection. Not clear the origin of the fluid collection  PLAN: I told him I would review the situation with the radiologist tomorrow. We continued to get an injection or another scan to see if this cavity is completely resolved in which case the drain can be removed. In the meantime she'll continue draining it and she's been doing.  Ayline Dingus J 05/12/2012

## 2012-05-12 NOTE — Patient Instructions (Signed)
I will talk to the radiologist tomorrow and make plans about the drain

## 2012-05-13 ENCOUNTER — Ambulatory Visit
Admission: RE | Admit: 2012-05-13 | Discharge: 2012-05-13 | Disposition: A | Discharge status: Home / Self Care | Payer: Medicare Other | Source: Ambulatory Visit | Attending: Surgery | Admitting: Surgery

## 2012-05-13 ENCOUNTER — Telehealth (INDEPENDENT_AMBULATORY_CARE_PROVIDER_SITE_OTHER): Payer: Self-pay | Admitting: General Surgery

## 2012-05-13 ENCOUNTER — Encounter (INDEPENDENT_AMBULATORY_CARE_PROVIDER_SITE_OTHER): Payer: Self-pay

## 2012-05-13 ENCOUNTER — Other Ambulatory Visit (INDEPENDENT_AMBULATORY_CARE_PROVIDER_SITE_OTHER): Payer: Self-pay | Admitting: General Surgery

## 2012-05-13 ENCOUNTER — Other Ambulatory Visit (INDEPENDENT_AMBULATORY_CARE_PROVIDER_SITE_OTHER): Payer: Self-pay | Admitting: Surgery

## 2012-05-13 DIAGNOSIS — R188 Other ascites: Secondary | ICD-10-CM

## 2012-05-13 DIAGNOSIS — T8189XD Other complications of procedures, not elsewhere classified, subsequent encounter: Secondary | ICD-10-CM

## 2012-05-13 NOTE — Addendum Note (Signed)
Addended byLiliana Cline on: 05/13/2012 08:42 AM   Modules accepted: Orders

## 2012-05-13 NOTE — Telephone Encounter (Signed)
Spoke with patient  She is aware  Of appt 05/18/12 1030am

## 2012-05-18 ENCOUNTER — Ambulatory Visit (HOSPITAL_COMMUNITY): Payer: Medicare Other

## 2012-06-01 ENCOUNTER — Inpatient Hospital Stay (HOSPITAL_COMMUNITY)
Admission: EM | Admit: 2012-06-01 | Discharge: 2012-06-03 | DRG: 603 | Disposition: A | Discharge status: Home / Self Care | Payer: Medicare Other | Attending: Surgery | Admitting: Surgery

## 2012-06-01 ENCOUNTER — Emergency Department (HOSPITAL_COMMUNITY): Payer: Medicare Other

## 2012-06-01 ENCOUNTER — Encounter (HOSPITAL_COMMUNITY): Payer: Self-pay | Admitting: Emergency Medicine

## 2012-06-01 DIAGNOSIS — Z823 Family history of stroke: Secondary | ICD-10-CM

## 2012-06-01 DIAGNOSIS — Z885 Allergy status to narcotic agent status: Secondary | ICD-10-CM

## 2012-06-01 DIAGNOSIS — L02219 Cutaneous abscess of trunk, unspecified: Principal | ICD-10-CM | POA: Diagnosis present

## 2012-06-01 DIAGNOSIS — R51 Headache: Secondary | ICD-10-CM | POA: Diagnosis present

## 2012-06-01 DIAGNOSIS — A498 Other bacterial infections of unspecified site: Secondary | ICD-10-CM | POA: Diagnosis present

## 2012-06-01 DIAGNOSIS — G35 Multiple sclerosis: Secondary | ICD-10-CM | POA: Diagnosis present

## 2012-06-01 DIAGNOSIS — R109 Unspecified abdominal pain: Secondary | ICD-10-CM

## 2012-06-01 DIAGNOSIS — L03319 Cellulitis of trunk, unspecified: Secondary | ICD-10-CM

## 2012-06-01 DIAGNOSIS — Z87440 Personal history of urinary (tract) infections: Secondary | ICD-10-CM

## 2012-06-01 DIAGNOSIS — R188 Other ascites: Secondary | ICD-10-CM

## 2012-06-01 DIAGNOSIS — I10 Essential (primary) hypertension: Secondary | ICD-10-CM | POA: Diagnosis present

## 2012-06-01 DIAGNOSIS — Z8249 Family history of ischemic heart disease and other diseases of the circulatory system: Secondary | ICD-10-CM

## 2012-06-01 DIAGNOSIS — N390 Urinary tract infection, site not specified: Secondary | ICD-10-CM

## 2012-06-01 LAB — CBC WITH DIFFERENTIAL/PLATELET
Basophils Absolute: 0 10*3/uL (ref 0.0–0.1)
Basophils Relative: 0 % (ref 0–1)
Eosinophils Absolute: 0.1 10*3/uL (ref 0.0–0.7)
Eosinophils Relative: 0 % (ref 0–5)
Lymphocytes Relative: 18 % (ref 12–46)
MCH: 30.9 pg (ref 26.0–34.0)
MCV: 94.5 fL (ref 78.0–100.0)
Platelets: 337 10*3/uL (ref 150–400)
RDW: 14 % (ref 11.5–15.5)
WBC: 12.6 10*3/uL — ABNORMAL HIGH (ref 4.0–10.5)

## 2012-06-01 LAB — HEPATIC FUNCTION PANEL
Bilirubin, Direct: <0.1 mg/dL (ref 0.0–0.3)
Total Protein: 8 g/dL (ref 6.0–8.3)

## 2012-06-01 LAB — URINE MICROSCOPIC-ADD ON

## 2012-06-01 LAB — BASIC METABOLIC PANEL
Calcium: 9.9 mg/dL (ref 8.4–10.5)
GFR calc Af Amer: >90 mL/min (ref >90)
GFR calc non Af Amer: >90 mL/min (ref >90)
Sodium: 138 mEq/L (ref 135–145)

## 2012-06-01 LAB — URINALYSIS, ROUTINE W REFLEX MICROSCOPIC
Protein, ur: NEGATIVE mg/dL
Urobilinogen, UA: 0.2 mg/dL (ref 0.0–1.0)

## 2012-06-01 LAB — LIPASE, BLOOD: Lipase: 22 U/L (ref 11–59)

## 2012-06-01 MED ORDER — POLYETHYLENE GLYCOL 3350 17 G PO PACK
17.0000 g | PACK | Freq: Every day | ORAL | Status: DC | PRN
  Filled 2012-06-01: qty 1

## 2012-06-01 MED ORDER — IOHEXOL 300 MG/ML  SOLN
100.0000 mL | Freq: Once | INTRAMUSCULAR | Status: AC | PRN
  Administered 2012-06-01: 80 mL via INTRAVENOUS

## 2012-06-01 MED ORDER — MORPHINE SULFATE 2 MG/ML IJ SOLN: 1.0000 mg | INTRAMUSCULAR | Status: DC | PRN

## 2012-06-01 MED ORDER — HYDROMORPHONE HCL PF 1 MG/ML IJ SOLN
0.5000 mg | Freq: Once | INTRAMUSCULAR | Status: DC
  Filled 2012-06-01: qty 1

## 2012-06-01 MED ORDER — ONDANSETRON HCL 4 MG/2ML IJ SOLN
4.0000 mg | Freq: Once | INTRAMUSCULAR | Status: AC
  Administered 2012-06-01: 4 mg via INTRAVENOUS
  Filled 2012-06-01: qty 2

## 2012-06-01 MED ORDER — MORPHINE SULFATE 4 MG/ML IJ SOLN
4.0000 mg | Freq: Once | INTRAMUSCULAR | Status: AC
  Administered 2012-06-01: 4 mg via INTRAVENOUS
  Filled 2012-06-01: qty 1

## 2012-06-01 MED ORDER — ONDANSETRON HCL 4 MG/2ML IJ SOLN
4.0000 mg | Freq: Four times a day (QID) | INTRAMUSCULAR | Status: DC | PRN
  Administered 2012-06-02: 4 mg via INTRAVENOUS
  Filled 2012-06-01: qty 2

## 2012-06-01 MED ORDER — POTASSIUM CHLORIDE IN NACL 20-0.9 MEQ/L-% IV SOLN
INTRAVENOUS | Status: DC
  Administered 2012-06-01 – 2012-06-02 (×2): via INTRAVENOUS
  Administered 2012-06-02: 1000 mL via INTRAVENOUS
  Filled 2012-06-01 (×5): qty 1000

## 2012-06-01 MED ORDER — IOHEXOL 300 MG/ML  SOLN
50.0000 mL | Freq: Once | INTRAMUSCULAR | Status: AC | PRN
  Administered 2012-06-01: 50 mL via ORAL

## 2012-06-01 MED ORDER — ACETAMINOPHEN 650 MG RE SUPP: 650.0000 mg | Freq: Four times a day (QID) | RECTAL | Status: DC | PRN

## 2012-06-01 MED ORDER — ACETAMINOPHEN 325 MG PO TABS
650.0000 mg | ORAL_TABLET | Freq: Four times a day (QID) | ORAL | Status: DC | PRN
  Administered 2012-06-01 (×2): 650 mg via ORAL
  Filled 2012-06-01 (×2): qty 2

## 2012-06-01 MED ORDER — DEXTROSE 5 % IV SOLN
1.0000 g | Freq: Once | INTRAVENOUS | Status: AC
  Administered 2012-06-01: 1 g via INTRAVENOUS
  Filled 2012-06-01: qty 10

## 2012-06-01 MED ORDER — ENOXAPARIN SODIUM 40 MG/0.4ML ~~LOC~~ SOLN
40.0000 mg | SUBCUTANEOUS | Status: DC
  Administered 2012-06-01 – 2012-06-02 (×2): 40 mg via SUBCUTANEOUS
  Filled 2012-06-01 (×4): qty 0.4

## 2012-06-01 MED ORDER — PIPERACILLIN-TAZOBACTAM 3.375 G IVPB
3.3750 g | Freq: Three times a day (TID) | INTRAVENOUS | Status: DC
  Administered 2012-06-01 – 2012-06-03 (×6): 3.375 g via INTRAVENOUS
  Filled 2012-06-01 (×8): qty 50

## 2012-06-01 NOTE — ED Notes (Signed)
Patient states that she started having R lower abdominal pain Saturday and has been getting worse since.  Patient endorses nausea but denies vomiting and diarrhea.   Patient states was running 100.3 temp on Saturday.

## 2012-06-01 NOTE — ED Provider Notes (Signed)
History     CSN: [id number]  Arrival date & time 06/01/12  4098   First MD Initiated Contact with Patient 06/01/12 [phone]      No chief complaint on file.   (Consider location/radiation/quality/duration/timing/severity/associated sxs/prior treatment) HPI  57 year old female with hx of MS, recurrent UTI, having to self cath, shingle outbreak, was treated for an abdominal wall fluid collection requiring drainage last month presents c/o abd pain.   Patient reports for the past 2-3 days she has developed pain to the right abdomen. Described pain as a burning sensation, stabbing with movement, and improves with rest. Pain will first noted while patient was sitting at lunch. Patient also endorsed mild nausea without vomiting or diarrhea. 3 days ago she had a mild elevated temperature of 100.3, which improves after taking Aleve. Pain is rated as a 10 out of 10 at its worst, radiates to the side of the abdomen. She denies chills, cough, chest pain, shortness of breath, back pain, dysuria, hematuria, medications, or melena. She denies any rash, or any recent trauma. States pain felt different from the abdominal wall fluid collection which she was treated a month ago.  Has had shingle outbreak last August but sts this pain felt different.  Her LBM was yesterday and it was normal.  Pt has no appetite currently.  No hx of smoking or alcohol abuse.   Past Medical History  Diagnosis Date  . Hypertension   . Multiple sclerosis 1972  . Self-catheterizes urinary bladder ~ 2012    "sphincter in bladder doesn't work properly due to my MS" (05/17/2012  . History of recurrent UTIs   . History of blood transfusion ~ 2012    "after multiple UTI's; got bladder infection; passed alot of blood" (05/05/2012)  . Headache     "several/wk" (05/17/2012)  . 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 Abdominal fluid collection 05/07/2012    Past Surgical History  Procedure Laterality Date  . Vaginal hysterectomy  2011    Family  History  Problem Relation Age of Onset  . Heart disease Mother   . Heart disease Father   . Heart disease Brother     History  Substance Use Topics  . Smoking status: Never Smoker   . Smokeless tobacco: Never Used  . Alcohol Use: No    OB History   Grav Para Term Preterm Abortions TAB SAB Ect Mult Living                  Review of Systems  Constitutional:       10 Systems reviewed and all are negative for acute change except as noted in the HPI.     Allergies  Oxycodone  Home Medications   Current Outpatient Rx  Name  Route  Sig  Dispense  Refill  . acetaminophen (TYLENOL) 325 MG tablet   Oral   Take 2 tablets (650 mg total) by mouth every 6 (six) hours as needed.         . Calcium Carbonate-Vitamin D (CALCIUM 600 + D PO)   Oral   Take 1 tablet by mouth daily.         . cephALEXin (KEFLEX) 500 MG capsule   Oral   Take 1 capsule (500 mg total) by mouth every 8 (eight) hours.   21 capsule   1   . ferrous sulfate 325 (65 FE) MG tablet   Oral   Take 325 mg by mouth daily with breakfast.         .  Interferon Beta-1b (BETASERON) 0.3 MG KIT injection   Subcutaneous   Inject 0.25 mg into the skin every other day.         . lisinopril (PRINIVIL,ZESTRIL) 10 MG tablet   Oral   Take 10 mg by mouth daily.         . Misc Natural Products (OSTEO BI-FLEX ADV JOINT SHIELD PO)   Oral   Take 1 tablet by mouth daily.         . Multiple Vitamin (MULTIVITAMIN) tablet   Oral   Take 1 tablet by mouth daily.         . ondansetron (ZOFRAN) 4 MG tablet   Oral   Take 4 mg by mouth every 8 (eight) hours as needed for nausea.         Marland Kitchen oxybutynin (DITROPAN) 5 MG tablet   Oral   Take 5 mg by mouth 2 (two) times daily.           BP 120/73  Pulse 116  Temp(Src) 98.2 F (36.8 C) (Oral)  Resp 16  SpO2 98%  Physical Exam  Nursing note and vitals reviewed. Constitutional: She is oriented to person, place, and time. She appears well-developed and  well-nourished. No distress.  Awake, alert, nontoxic appearance  HENT:  Head: Atraumatic.  Mouth/Throat: Oropharynx is clear and moist.  Eyes: Conjunctivae are normal. Right eye exhibits no discharge. Left eye exhibits no discharge.  Neck: Neck supple.  Cardiovascular: Normal rate and regular rhythm.   Pulmonary/Chest: Effort normal. No respiratory distress. She exhibits no tenderness.  Abdominal: Soft. She exhibits no distension and no mass. There is tenderness (tenderness to R side of abdomen with mild and deep palpation with guarding but without rebound tenderness.  No overlying skin changes, no hernia, no rash, no abscess.  No McBurney's point, no Murphy sign.  Abdomen nondistended). There is no rebound.  Genitourinary:  No CVA tenderness  Musculoskeletal: She exhibits no tenderness.  ROM appears intact, no obvious focal weakness  Neurological: She is alert and oriented to person, place, and time.  Mental status and motor strength appears intact  Skin: No rash noted.  Psychiatric: She has a normal mood and affect.    ED Course  Procedures (including critical care time)  8:09 AM Pt presents with pain to her R side abdomen. Pain reproducible on exam without obvious physical changes.  Since pt is moderately tender, and when considering her age i will obtain abd/pelvis CT w/CM for further evaluation.  Pain medication offered, pt declined.    12:14 PM  UA shows evidence of UTI.  Mildly elevated WBC of 12.6.  Rocephin given here in ER.    Patient had a percutaneous abscess with successful drainage via IR last month. Although patient has no significant left side abdominal pain, CT scan today shows a recurrent left anterior abdominal abscess measuring 5.6x3.1x7.3 cm. There is a phlegmon presents to the right anterior abdominal wall soft tissue which appears similar to prior exam. Due to this finding, I will consult general surgery. Care discussed with my attending who will evaluate  patient.  12:38 PM i have consulted with [facility], who agrees to see pt in ER and will determine plan of dispo.   1:52 PM [facility] has evaluated pt and will admit for further management.  Pt is aware of plan.  Pt stable for admission.    Labs Reviewed  CBC WITH DIFFERENTIAL - Abnormal; Notable for the following:    WBC 12.6 (*)  RBC 3.30 (*)    Hemoglobin 10.2 (*)    HCT 31.2 (*)    Neutro Abs 9.4 (*)    All other components within normal limits  URINALYSIS, ROUTINE W REFLEX MICROSCOPIC - Abnormal; Notable for the following:    APPearance CLOUDY (*)    Hgb urine dipstick LARGE (*)    Nitrite POSITIVE (*)    Leukocytes, UA LARGE (*)    All other components within normal limits  URINE MICROSCOPIC-ADD ON - Abnormal; Notable for the following:    Bacteria, UA MANY (*)    All other components within normal limits  URINE CULTURE  BASIC METABOLIC PANEL  HEPATIC FUNCTION PANEL  LIPASE, BLOOD   Ct Abdomen Pelvis W Contrast  06/01/2012  *RADIOLOGY REPORT*  Clinical Data: abdominal pain and fever for 2 days.  Recent drainage on the left side.  History of prior abscess.  CT ABDOMEN AND PELVIS WITH CONTRAST  Technique:  Multidetector CT imaging of the abdomen and pelvis was performed following the standard protocol during bolus administration of intravenous contrast.  Contrast: 80mL OMNIPAQUE IOHEXOL 300 MG/ML  SOLN  Comparison: None.  Findings: Phlegmon is present in the right anterior abdominal wall soft tissues, which appears similar to the prior exam.  There is recurrent fluid collection in the left anterior abdominal wall that measures 5.6 cm transverse by 3.1 cm AP.  This is not the cavity that was previously drained. Craniocaudal extent is 7.3 cm.  The appearance is compatible with recurrent abscess.  There is no violation of the peritoneum or intra-abdominal extension.  Solid and hollow abdominal viscera appears similar compared to the recent prior examination without an acute intra- abdominal  abnormality.  The bladder is decompressed with chronic mural thickening and mucosal enhancement.  Compression fractures again noted.  IMPRESSION: Recurrent left anterior abdominal wall abscess measuring 5.6 x 3.1 x 7.3 cm.   Original Report Authenticated By: Andreas Newport, M.D.      1. Abdominal pain   2. Abdominal fluid collection   3. UTI (lower urinary tract infection)       MDM  BP 112/59  Pulse 87  Temp(Src) 98.2 F (36.8 C) (Oral)  Resp 16  Ht 5\' 1"  (1.549 m)  Wt 128 lb (58.06 kg)  BMI 24.2 kg/m2  SpO2 98%  I have reviewed nursing notes and vital signs. I personally reviewed the imaging tests through PACS system  I reviewed available ER/hospitalization records thought the EMR         Fayrene Helper, [place] 06/01/12 1353

## 2012-06-01 NOTE — H&P (Addendum)
[name] is an 57 y.o. female.    PCP: Dr. Andrey Campanile Urology: Dr Annabell Howells  Chief Complaint: Right sided abdominal pain HPI: [name] is a 57 year old female with a past medical history of MS and recurrent UTIs who presented to [facility] ED with right sided abdominal pain. Duration of symptoms is 2 days.  Onset was sudden on Saturday evening.  Coarse; worsened.  Associating factors; fever and chills.  Characterized as sharp burning pain.  Location right abdomen and without radiation.  Worsens with movement.  No modifying factors.  Time pattern is constant.  Previous episode 3 weeks ago, although her pain was on the LEFT.  Apparently the patient was seen by PCP PA at which time a CT of abdomen was performed, she was then referred to Dr. Jamey Ripa and the patient was subsequently admitted, abscess was drained, she was discharged home with the drain which remain for a week.  It was discontinued 1 week later when 7.5cc per day remained draining.  The patient states she has been on Betaseron for 8-10 years.  Initially she was administering to arms, but changed to her abdomen several years ago, she usually rotates sides and uses her proximal aspect of lower extremities.  She cleans with alcohol prior to each administration.   Past Medical History  Diagnosis Date  . Hypertension   . Multiple sclerosis 1972  . Self-catheterizes urinary bladder ~ 2012    "sphincter in bladder doesn't work properly due to my MS" (05/17/2012  . History of recurrent UTIs   . History of blood transfusion ~ 2012    "after multiple UTI's; got bladder infection; passed alot of blood" (05/05/2012)  . Headache     "several/wk" (05/17/2012)  . 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 Abdominal fluid collection 05/07/2012    Past Surgical History  Procedure Laterality Date  . Vaginal hysterectomy  2011    Family History  Problem Relation Age of Onset  . Heart disease Mother   . Congestive Heart Failure Mother   . Heart disease Father   .  Stroke Father   . Heart disease Brother   . Heart attack Brother    Social History:  reports that she has never smoked. She has never used smokeless tobacco. She reports that she does not drink alcohol or use illicit drugs.  She is married.  Allergies:  Allergies  Allergen Reactions  . Oxycodone Nausea And Vomiting     (Not in a hospital admission)  Results for orders placed during the hospital encounter of 06/01/12 (from the past 48 hour(s))  URINALYSIS, ROUTINE W REFLEX MICROSCOPIC     Status: Abnormal   Collection Time    06/01/12  8:11 AM      Result Value Range   Color, Urine YELLOW  YELLOW   APPearance CLOUDY (*) CLEAR   Specific Gravity, Urine 1.023  1.005 - 1.030   pH 5.5  5.0 - 8.0   Glucose, UA NEGATIVE  NEGATIVE mg/dL   Hgb urine dipstick LARGE (*) NEGATIVE   Bilirubin Urine NEGATIVE  NEGATIVE   Ketones, ur NEGATIVE  NEGATIVE mg/dL   Protein, ur NEGATIVE  NEGATIVE mg/dL   Urobilinogen, UA 0.2  0.0 - 1.0 mg/dL   Nitrite POSITIVE (*) NEGATIVE   Leukocytes, UA LARGE (*) NEGATIVE  URINE MICROSCOPIC-ADD ON     Status: Abnormal   Collection Time    06/01/12  8:11 AM      Result Value Range   Squamous Epithelial /  LPF RARE  RARE   WBC, UA TOO NUMEROUS TO COUNT  <3 WBC/hpf   RBC / HPF 11-20  <3 RBC/hpf   Bacteria, UA MANY (*) RARE  CBC WITH DIFFERENTIAL     Status: Abnormal   Collection Time    06/01/12  9:10 AM      Result Value Range   WBC 12.6 (*) 4.0 - 10.5 K/uL   RBC 3.30 (*) 3.87 - 5.11 MIL/uL   Hemoglobin 10.2 (*) 12.0 - 15.0 g/dL   HCT 45.4 (*) 09.8 - 11.9 %   MCV 94.5  78.0 - 100.0 fL   MCH 30.9  26.0 - 34.0 pg   MCHC 32.7  30.0 - 36.0 g/dL   RDW 14.7  82.9 - 56.2 %   Platelets 337  150 - 400 K/uL   Neutrophils Relative 74  43 - 77 %   Neutro Abs 9.4 (*) 1.7 - 7.7 K/uL   Lymphocytes Relative 18  12 - 46 %   Lymphs Abs 2.3  0.7 - 4.0 K/uL   Monocytes Relative 7  3 - 12 %   Monocytes Absolute 0.9  0.1 - 1.0 K/uL   Eosinophils Relative 0  0 - 5 %    Eosinophils Absolute 0.1  0.0 - 0.7 K/uL   Basophils Relative 0  0 - 1 %   Basophils Absolute 0.0  0.0 - 0.1 K/uL  BASIC METABOLIC PANEL     Status: None   Collection Time    06/01/12  9:10 AM      Result Value Range   Sodium 138  135 - 145 mEq/L   Potassium 4.0  3.5 - 5.1 mEq/L   Chloride 101  96 - 112 mEq/L   CO2 28  19 - 32 mEq/L   Glucose, Bld 97  70 - 99 mg/dL   BUN 20  6 - 23 mg/dL   Creatinine, Ser 1.30  0.50 - 1.10 mg/dL   Calcium 9.9  8.4 - 86.5 mg/dL   GFR calc non Af Amer >90  >90 mL/min   GFR calc Af Amer >90  >90 mL/min   Comment:            The eGFR has been calculated     using the CKD EPI equation.     This calculation has not been     validated in all clinical     situations.     eGFR's persistently     <90 mL/min signify     possible Chronic Kidney Disease.  HEPATIC FUNCTION PANEL     Status: None   Collection Time    06/01/12  9:10 AM      Result Value Range   Total Protein 8.0  6.0 - 8.3 g/dL   Albumin 3.5  3.5 - 5.2 g/dL   AST 27  0 - 37 U/L   ALT 14  0 - 35 U/L   Alkaline Phosphatase 80  39 - 117 U/L   Total Bilirubin 0.3  0.3 - 1.2 mg/dL   Bilirubin, Direct <7.8  0.0 - 0.3 mg/dL   Indirect Bilirubin NOT CALCULATED  0.3 - 0.9 mg/dL  LIPASE, BLOOD     Status: None   Collection Time    06/01/12  9:10 AM      Result Value Range   Lipase 22  11 - 59 U/L   Ct Abdomen Pelvis W Contrast  06/01/2012  *RADIOLOGY REPORT*  Clinical Data: abdominal pain and fever  for 2 days.  Recent drainage on the left side.  History of prior abscess.  CT ABDOMEN AND PELVIS WITH CONTRAST  Technique:  Multidetector CT imaging of the abdomen and pelvis was performed following the standard protocol during bolus administration of intravenous contrast.  Contrast: 80mL OMNIPAQUE IOHEXOL 300 MG/ML  SOLN  Comparison: None.  Findings: Phlegmon is present in the right anterior abdominal wall soft tissues, which appears similar to the prior exam.  There is recurrent fluid collection in the  left anterior abdominal wall that measures 5.6 cm transverse by 3.1 cm AP.  This is not the cavity that was previously drained. Craniocaudal extent is 7.3 cm.  The appearance is compatible with recurrent abscess.  There is no violation of the peritoneum or intra-abdominal extension.  Solid and hollow abdominal viscera appears similar compared to the recent prior examination without an acute intra- abdominal abnormality.  The bladder is decompressed with chronic mural thickening and mucosal enhancement.  Compression fractures again noted.  IMPRESSION: Recurrent left anterior abdominal wall abscess measuring 5.6 x 3.1 x 7.3 cm.   Original Report Authenticated By: Andreas Newport, M.D.     Review of Systems  Constitutional: Positive for fever and chills. Negative for diaphoresis.  HENT: Negative for nosebleeds, sore throat and neck pain.   Eyes: Negative for blurred vision, photophobia, pain, discharge and redness.  Respiratory: Negative for cough, hemoptysis and shortness of breath.   Cardiovascular: Negative for chest pain, palpitations, leg swelling and PND.  Gastrointestinal: Positive for abdominal pain. Negative for heartburn, nausea, diarrhea, constipation, blood in stool and melena.  Genitourinary: Positive for dysuria. Negative for urgency, hematuria and flank pain.  Musculoskeletal: Negative for myalgias and joint pain.  Neurological: Positive for headaches. Negative for dizziness, tingling, sensory change, speech change, seizures, loss of consciousness and weakness.  Psychiatric/Behavioral: Negative for memory loss and substance abuse. The patient is not nervous/anxious.     Blood pressure 119/57, pulse 92, temperature 98.2 F (36.8 C), temperature source Oral, resp. rate 16, height 5\' 1"  (1.549 m), weight 128 lb (58.06 kg), SpO2 97.00%. Physical Exam  Constitutional: She is oriented to person, place, and time. She appears well-developed and well-nourished. No distress.  HENT:  Head:  Normocephalic.  Mouth/Throat: No oropharyngeal exudate.  Eyes: Conjunctivae and EOM are normal. Pupils are equal, round, and reactive to light. No scleral icterus.  Neck: No JVD present.  Cardiovascular: Normal rate, regular rhythm and intact distal pulses.  Exam reveals gallop. Exam reveals no friction rub.   No murmur heard. Respiratory: No respiratory distress. She has no rales. She exhibits no tenderness.  GI: Soft. Bowel sounds are normal. She exhibits no distension and no mass. There is no rebound.  Generalized tenderness, most prominent rlq   Musculoskeletal: She exhibits no edema and no tenderness.  Lymphadenopathy:    She has no cervical adenopathy.  Neurological: She is alert and oriented to person, place, and time.  Skin: Skin is warm and dry. No rash noted. She is not diaphoretic. No pallor.  Psychiatric: She has a normal mood and affect. Her behavior is normal. Judgment and thought content normal.     Assessment/Plan  left anterior abdominal wall abscess measuring 5.6 x 3.1  x 7.3 cm. -etiology is unclear, although she administers betaseron to the region every other day. -WBC 12.6 -start zosyn -IV pain medication.  She is able to tolerate morphine. -Repeat CBC in AM -May need IR drain placement, but we will see how she does overnight.  -  Regular diet  UTI -rocephin given in ED  -Urine culture is pending -Encourage oral fluid intake. -In and out caths  VTE prophylaxis -SCDs, Lovenox, ambulate  Phlegmon right anterior abdominal wall -similar to prior exam, but this corresponds with the area of tenderness   RIEBOCK, EMINA ANP-BC 06/01/2012, 2:29 PM

## 2012-06-01 NOTE — ED Notes (Signed)
General Surgery at bedside- to admit

## 2012-06-01 NOTE — ED Provider Notes (Signed)
Pt relates she used a push mower two days ago. She has right sided abdominal pain today. She had a recent abscess drained from her left abdomen by IR. She reports no left sided pain.  Pt is tender diffusely in her right abdomen, she has no pain on the left.    Medical screening examination/treatment/procedure(s) were conducted as a shared visit with non-physician practitioner(s) and myself.  I personally evaluated the patient during the encounter  Devoria Albe, MD, Franz Dell, MD 06/01/12 [phone]

## 2012-06-01 NOTE — ED Notes (Signed)
Pt. At CT.

## 2012-06-01 NOTE — ED Provider Notes (Signed)
See prior note   Ward Givens, MD 06/01/12 1359

## 2012-06-01 NOTE — ED Notes (Signed)
Family at bedside. 

## 2012-06-01 NOTE — H&P (Signed)
Patient's symptoms are all on her right side.  The left side feels fine.  She has a recurrent seroma on the left, and a phlegmonous area of inflammation on the right.  Will leave the left side seroma alone unless she shows signs of infection in this area.  IV medication for several days until the inflammation/ infection on the right side resolves.  [name]. Jennifer Skains, MD, [facility] Surgery  06/01/2012 3:04 PM

## 2012-06-02 LAB — URINE CULTURE

## 2012-06-02 LAB — CBC
Hemoglobin: 9.3 g/dL — ABNORMAL LOW (ref 12.0–15.0)
MCH: 31 pg (ref 26.0–34.0)
MCV: 92.7 fL (ref 78.0–100.0)
RBC: 3 MIL/uL — ABNORMAL LOW (ref 3.87–5.11)

## 2012-06-02 MED ORDER — OXYBUTYNIN CHLORIDE 5 MG PO TABS
5.0000 mg | ORAL_TABLET | Freq: Two times a day (BID) | ORAL | Status: DC
  Administered 2012-06-02 – 2012-06-03 (×2): 5 mg via ORAL
  Filled 2012-06-02 (×3): qty 1

## 2012-06-02 MED ORDER — LISINOPRIL 10 MG PO TABS
10.0000 mg | ORAL_TABLET | Freq: Every day | ORAL | Status: DC
  Administered 2012-06-02 – 2012-06-03 (×2): 10 mg via ORAL
  Filled 2012-06-02 (×2): qty 1

## 2012-06-02 NOTE — Progress Notes (Signed)
Utilization Review completed.  Byrne Capek RN CM  

## 2012-06-02 NOTE — Progress Notes (Signed)
Tenderness still on Right side, but slightly better. No left-sided tenderness;  No sign of infection on left side Continue antibiotics  Wilmon Arms. Corliss Skains, MD, [facility] Surgery  General/ Trauma Surgery  06/02/2012 11:00 AM

## 2012-06-02 NOTE — Progress Notes (Signed)
Subjective: Doing well this morning.  Tolerating PO well.  Does have a little nausea which she attributes to being hungry.  Passing gas.  Still with abdominal pain on the right side, worse with movement.  Objective: Vital signs in last 24 hours: Temp:  [98.2 F (36.8 C)-99.4 F (37.4 C)] 98.4 F (36.9 C) (05/06 0513) Pulse Rate:  [84-113] 95 (05/06 0513) Resp:  [16] 16 (05/06 0513) BP: (107-137)/(44-74) 119/65 mmHg (05/06 0513) SpO2:  [97 %-100 %] 97 % (05/06 0513) Weight:  [126 lb 1.6 oz (57.199 kg)] 126 lb 1.6 oz (57.199 kg) (05/05 1630) Last BM Date: 06/01/12  Intake/Output from previous day: 05/05 0701 - 05/06 0700 In: 1285 [P.O.:120; I.V.:1015; IV Piggyback:150] Out: 300 [Urine:300] Intake/Output this shift:    PE: Gen: alert, cooperative, NAD CV: RRR Pulm: CTAB Abd: +BS, soft, non-distended, no rebound, tender along right side mostly in RLQ  Lab Results:   Recent Labs  06/01/12 0910 06/02/12 0530  WBC 12.6* 10.8*  HGB 10.2* 9.3*  HCT 31.2* 27.8*  PLT 337 320   BMET  Recent Labs  06/01/12 0910 06/01/12 1430  NA 138  --   K 4.0  --   CL 101  --   CO2 28  --   GLUCOSE 97  --   BUN 20  --   CREATININE 0.74 0.79  CALCIUM 9.9  --    PT/INR No results found for this basename: LABPROT, INR,  in the last 72 hours CMP     Component Value Date/Time   NA 138 06/01/2012 0910   K 4.0 06/01/2012 0910   CL 101 06/01/2012 0910   CO2 28 06/01/2012 0910   GLUCOSE 97 06/01/2012 0910   BUN 20 06/01/2012 0910   CREATININE 0.79 06/01/2012 1430   CALCIUM 9.9 06/01/2012 0910   PROT 8.0 06/01/2012 0910   ALBUMIN 3.5 06/01/2012 0910   AST 27 06/01/2012 0910   ALT 14 06/01/2012 0910   ALKPHOS 80 06/01/2012 0910   BILITOT 0.3 06/01/2012 0910   GFRNONAA 89* 06/01/2012 1430   GFRAA >90 06/01/2012 1430   Lipase     Component Value Date/Time   LIPASE 22 06/01/2012 0910       Studies/Results: Ct Abdomen Pelvis W Contrast  06/01/2012  *RADIOLOGY REPORT*  Clinical Data: abdominal pain and  fever for 2 days.  Recent drainage on the left side.  History of prior abscess.  CT ABDOMEN AND PELVIS WITH CONTRAST  Technique:  Multidetector CT imaging of the abdomen and pelvis was performed following the standard protocol during bolus administration of intravenous contrast.  Contrast: 80mL OMNIPAQUE IOHEXOL 300 MG/ML  SOLN  Comparison: None.  Findings: Phlegmon is present in the right anterior abdominal wall soft tissues, which appears similar to the prior exam.  There is recurrent fluid collection in the left anterior abdominal wall that measures 5.6 cm transverse by 3.1 cm AP.  This is not the cavity that was previously drained. Craniocaudal extent is 7.3 cm.  The appearance is compatible with recurrent abscess.  There is no violation of the peritoneum or intra-abdominal extension.  Solid and hollow abdominal viscera appears similar compared to the recent prior examination without an acute intra- abdominal abnormality.  The bladder is decompressed with chronic mural thickening and mucosal enhancement.  Compression fractures again noted.  IMPRESSION: Recurrent left anterior abdominal wall abscess measuring 5.6 x 3.1 x 7.3 cm.   Original Report Authenticated By: Andreas Newport, M.D.     Anti-infectives: Anti-infectives  Start     Dose/Rate Route Frequency Ordered Stop   06/01/12 1430  piperacillin-tazobactam (ZOSYN) IVPB 3.375 g     3.375 g 12.5 mL/hr over 240 Minutes Intravenous 3 times per day 06/01/12 1423     06/01/12 1000  cefTRIAXone (ROCEPHIN) 1 g in dextrose 5 % 50 mL IVPB     1 g 100 mL/hr over 30 Minutes Intravenous  Once 06/01/12 0947 06/01/12 1131       Assessment/Plan 1. Left anterior abdominal wall abscess - white count trending down 2. Right anterior abdominal wall phlegmon 3. UTI - urine culture pending 4. MS  Plan 1. Continue IV antibiotics, transition to PO once right sided pain improves 2. Continue pain control with tylenol; morphine available if needed 3. Regular  diet, up ad lib 4. F/u urine culture Discharge pending improvement in right sided tenderness   LOS: 1 day    BOOTH, Jaylanie Boschee 06/02/2012, 7:54 AM Pager: [phone]

## 2012-06-03 ENCOUNTER — Telehealth (INDEPENDENT_AMBULATORY_CARE_PROVIDER_SITE_OTHER): Payer: Self-pay | Admitting: General Surgery

## 2012-06-03 DIAGNOSIS — G35 Multiple sclerosis: Secondary | ICD-10-CM | POA: Diagnosis present

## 2012-06-03 DIAGNOSIS — N390 Urinary tract infection, site not specified: Secondary | ICD-10-CM | POA: Diagnosis present

## 2012-06-03 LAB — CBC
HCT: 25.7 % — ABNORMAL LOW (ref 36.0–46.0)
Hemoglobin: 8.6 g/dL — ABNORMAL LOW (ref 12.0–15.0)
MCHC: 33.5 g/dL (ref 30.0–36.0)
MCV: 92.4 fL (ref 78.0–100.0)
RDW: 13.5 % (ref 11.5–15.5)

## 2012-06-03 MED ORDER — AMOXICILLIN-POT CLAVULANATE 875-125 MG PO TABS
1.0000 | ORAL_TABLET | Freq: Two times a day (BID) | ORAL | Status: DC
  Administered 2012-06-03: 1 via ORAL
  Filled 2012-06-03: qty 1

## 2012-06-03 MED ORDER — AMOXICILLIN-POT CLAVULANATE 875-125 MG PO TABS: 1.0000 | ORAL_TABLET | Freq: Two times a day (BID) | ORAL | Status: DC

## 2012-06-03 MED ORDER — CEPHALEXIN 500 MG PO CAPS
500.0000 mg | ORAL_CAPSULE | Freq: Three times a day (TID) | ORAL | Status: DC
  Administered 2012-06-03 (×2): 500 mg via ORAL
  Filled 2012-06-03 (×2): qty 1

## 2012-06-03 NOTE — Progress Notes (Signed)
Discharge instructions gone over with patient. Home medications gone over. Prescription was faxed in for patient. She will pick up antibiotic on the way home. Follow up appointment is already made. Signs and symptoms of worsening condition gone over and what to do. Diet and activity discussed. My chart sign up discussed. Patient stated understanding of instructions. Importance of finishing antibiotic was discussed.

## 2012-06-03 NOTE — Discharge Summary (Signed)
Physician Discharge Summary  Patient ID: VICCI REDER MRN: [id number] DOB/AGE: [birth year] 59 y.o.  Admit date: 06/01/2012 Discharge date: 06/03/2012  Admission Diagnoses: Left abdominal wall seroma Right abdominal wall phlegmon UTI Multiple sclerosis  Discharge Diagnoses:  Active Problems:   Abdominal pain   UTI (urinary tract infection)   Multiple sclerosis   Discharged Condition: good  Hospital Course: 57 year old woman with MS and recurrent UTIs who presented to the ED with right sided abdominal pain present for 2 days.  She injects Betaseron for MS daily.  Recent admission for left sided abdominal pain and found to have an abscess that was treated with antibiotics and a drain.  CT scan in the ED showed right abdominal well phlegmon and recurrent fluid collection in the left abdominal wall.    She was admitted and started on IV Zosyn for the right abdominal wall phlegmon.  Given that all her symptoms are on the right side, the left sided fluid collection is thought to be a seroma.  Her pain and white count improved and she was transitioned to oral Augmentin on the day of discharge.  She was also found to have an E Coli UTI.  This was treated with zosyn and transitioned to keflex for the last day of therapy.  She tolerated a regular diet while in the hospital.     Consults: None  Significant Diagnostic Studies: radiology: CT scan: left abdominal wall fluid collection, right abdominal wall phlegmon UCx: e coli, resistant to ampicillin, ciprofloxacin and levofloxacin  Treatments: antibiotics: Zosyn  Disposition: 01-Home or Self Care     Medication List    TAKE these medications       acetaminophen 325 MG tablet  Commonly known as:  TYLENOL  Take 650 mg by mouth every 6 (six) hours as needed for pain or fever.     amoxicillin-clavulanate 875-125 MG per tablet  Commonly known as:  AUGMENTIN  Take 1 tablet by mouth every 12 (twelve) hours.     BETASERON 0.3 MG Kit  injection  Generic drug:  Interferon Beta-1b  Inject 0.25 mg into the skin every other day.     CALCIUM 600 + D PO  Take 1 tablet by mouth daily.     ferrous sulfate 325 (65 FE) MG tablet  Take 325 mg by mouth daily with breakfast.     lisinopril 10 MG tablet  Commonly known as:  PRINIVIL,ZESTRIL  Take 10 mg by mouth daily.     multivitamin tablet  Take 1 tablet by mouth daily.     ondansetron 4 MG tablet  Commonly known as:  ZOFRAN  Take 4 mg by mouth every 8 (eight) hours as needed for nausea.     OSTEO BI-FLEX ADV JOINT SHIELD PO  Take 1 tablet by mouth daily.     oxybutynin 5 MG tablet  Commonly known as:  DITROPAN  Take 5 mg by mouth 2 (two) times daily.         Signed: BOOTH, Abednego Yeates 06/03/2012, 9:54 AM

## 2012-06-03 NOTE — Telephone Encounter (Signed)
Message copied by Liliana Cline on Wed Jun 03, 2012 10:24 AM ------      Message from: BOOTH, [place] J      Created: Wed Jun 03, 2012 10:07 AM      Regarding: f/u appt       Hi!      I'm sorry to bother you, but Dr. Corliss Skains actually wants this patient to follow up with Dr. Jamey Ripa instead of him.            Thanks!      Despina Hick, PGY-2 ------

## 2012-06-03 NOTE — Progress Notes (Signed)
Doing better. Will transition to PO abx. Discharge later today.  Follow-up Dr. Jamey Ripa 1-2 weeks.  Wilmon Arms. Corliss Skains, MD, [facility] Surgery  General/ Trauma Surgery  06/03/2012 10:07 AM

## 2012-06-03 NOTE — Discharge Summary (Signed)
Follow-up Dr. Jamey Ripa 1-2 weeks  Wilmon Arms. Corliss Skains, MD, [facility] Surgery  General/ Trauma Surgery  06/03/2012 10:27 AM

## 2012-06-03 NOTE — Telephone Encounter (Signed)
LMOM making patient aware of appt date.time change to Dr Jamey Ripa.

## 2012-06-03 NOTE — Progress Notes (Signed)
Subjective: Doing well this morning.  Tolerating PO well.  No nausea or vomiting.   Passing gas.  Right sided abdominal pain improving, now localized to the mid right side.  Objective: Vital signs in last 24 hours: Temp:  [97.8 F (36.6 C)-98.2 F (36.8 C)] 97.8 F (36.6 C) (05/07 0519) Pulse Rate:  [78-94] 78 (05/07 0519) Resp:  [15-18] 15 (05/06 1300) BP: (110-140)/(42-78) 136/52 mmHg (05/07 0519) SpO2:  [100 %] 100 % (05/07 0519) Last BM Date: 06/01/12  Intake/Output from previous day: 05/06 0701 - 05/07 0700 In: 1903.8 [I.V.:1753.8; IV Piggyback:150] Out: 100 [Urine:100] Intake/Output this shift: Total I/O In: 240 [P.O.:240] Out: -   PE: Gen: alert, cooperative, NAD CV: RRR Pulm: CTAB Abd: +BS, soft, non-distended, no rebound, improved tenderness on right side worse lateral to umbilicus; no tenderness on left side  Lab Results:   Recent Labs  06/02/12 0530 06/03/12 0535  WBC 10.8* 8.3  HGB 9.3* 8.6*  HCT 27.8* 25.7*  PLT 320 301   BMET  Recent Labs  06/01/12 0910 06/01/12 1430  NA 138  --   K 4.0  --   CL 101  --   CO2 28  --   GLUCOSE 97  --   BUN 20  --   CREATININE 0.74 0.79  CALCIUM 9.9  --    PT/INR No results found for this basename: LABPROT, INR,  in the last 72 hours CMP     Component Value Date/Time   NA 138 06/01/2012 0910   K 4.0 06/01/2012 0910   CL 101 06/01/2012 0910   CO2 28 06/01/2012 0910   GLUCOSE 97 06/01/2012 0910   BUN 20 06/01/2012 0910   CREATININE 0.79 06/01/2012 1430   CALCIUM 9.9 06/01/2012 0910   PROT 8.0 06/01/2012 0910   ALBUMIN 3.5 06/01/2012 0910   AST 27 06/01/2012 0910   ALT 14 06/01/2012 0910   ALKPHOS 80 06/01/2012 0910   BILITOT 0.3 06/01/2012 0910   GFRNONAA 89* 06/01/2012 1430   GFRAA >90 06/01/2012 1430   Lipase     Component Value Date/Time   LIPASE 22 06/01/2012 0910       Studies/Results: Ct Abdomen Pelvis W Contrast  06/01/2012  *RADIOLOGY REPORT*  Clinical Data: abdominal pain and fever for 2 days.  Recent  drainage on the left side.  History of prior abscess.  CT ABDOMEN AND PELVIS WITH CONTRAST  Technique:  Multidetector CT imaging of the abdomen and pelvis was performed following the standard protocol during bolus administration of intravenous contrast.  Contrast: 80mL OMNIPAQUE IOHEXOL 300 MG/ML  SOLN  Comparison: None.  Findings: Phlegmon is present in the right anterior abdominal wall soft tissues, which appears similar to the prior exam.  There is recurrent fluid collection in the left anterior abdominal wall that measures 5.6 cm transverse by 3.1 cm AP.  This is not the cavity that was previously drained. Craniocaudal extent is 7.3 cm.  The appearance is compatible with recurrent abscess.  There is no violation of the peritoneum or intra-abdominal extension.  Solid and hollow abdominal viscera appears similar compared to the recent prior examination without an acute intra- abdominal abnormality.  The bladder is decompressed with chronic mural thickening and mucosal enhancement.  Compression fractures again noted.  IMPRESSION: Recurrent left anterior abdominal wall abscess measuring 5.6 x 3.1 x 7.3 cm.   Original Report Authenticated By: Andreas Newport, M.D.     Anti-infectives: Anti-infectives   Start     Dose/Rate Route Frequency  Ordered Stop   06/03/12 1000  amoxicillin-clavulanate (AUGMENTIN) 875-125 MG per tablet 1 tablet     1 tablet Oral Every 12 hours 06/03/12 0915     06/01/12 1430  piperacillin-tazobactam (ZOSYN) IVPB 3.375 g  Status:  Discontinued     3.375 g 12.5 mL/hr over 240 Minutes Intravenous 3 times per day 06/01/12 1423 06/03/12 0915   06/01/12 1000  cefTRIAXone (ROCEPHIN) 1 g in dextrose 5 % 50 mL IVPB     1 g 100 mL/hr over 30 Minutes Intravenous  Once 06/01/12 0947 06/01/12 1131       Assessment/Plan 1. Left anterior abdominal wall seroma 2. Right anterior abdominal wall phlegmon 3. UTI - e coli resistant to amoxicillin 4. MS  Plan 1. Transition antibiotic to  Augmentin today. 2. Keflex for UTI 3. Continue pain control with tylenol 4. Regular diet, up ad lib Likely discharge tomorrow on augmentin. Will need total of 3 days of antibiotics for UTI.   LOS: 2 days    BOOTH, ERIN 06/03/2012, 9:16 AM Pager: [phone]

## 2012-06-12 ENCOUNTER — Ambulatory Visit (INDEPENDENT_AMBULATORY_CARE_PROVIDER_SITE_OTHER): Payer: Medicare Other | Admitting: Surgery

## 2012-06-12 ENCOUNTER — Other Ambulatory Visit: Payer: Self-pay | Admitting: General Surgery

## 2012-06-12 ENCOUNTER — Telehealth (INDEPENDENT_AMBULATORY_CARE_PROVIDER_SITE_OTHER): Payer: Self-pay

## 2012-06-12 ENCOUNTER — Ambulatory Visit
Admission: RE | Admit: 2012-06-12 | Discharge: 2012-06-12 | Disposition: A | Discharge status: Home / Self Care | Payer: Medicare Other | Source: Ambulatory Visit | Attending: Surgery | Admitting: Surgery

## 2012-06-12 ENCOUNTER — Encounter (INDEPENDENT_AMBULATORY_CARE_PROVIDER_SITE_OTHER): Payer: Self-pay | Admitting: Surgery

## 2012-06-12 ENCOUNTER — Inpatient Hospital Stay (HOSPITAL_COMMUNITY)
Admission: AD | Admit: 2012-06-12 | Discharge: 2012-06-16 | DRG: 603 | Disposition: A | Discharge status: Home / Self Care | Payer: Medicare Other | Source: Ambulatory Visit | Attending: General Surgery | Admitting: General Surgery

## 2012-06-12 VITALS — BP 134/78 | HR 78 | Temp 96.5°F | Ht 61.0 in | Wt 125.8 lb

## 2012-06-12 DIAGNOSIS — R1011 Right upper quadrant pain: Secondary | ICD-10-CM | POA: Diagnosis present

## 2012-06-12 DIAGNOSIS — L02219 Cutaneous abscess of trunk, unspecified: Principal | ICD-10-CM | POA: Diagnosis present

## 2012-06-12 DIAGNOSIS — R599 Enlarged lymph nodes, unspecified: Secondary | ICD-10-CM | POA: Diagnosis present

## 2012-06-12 DIAGNOSIS — G35 Multiple sclerosis: Secondary | ICD-10-CM | POA: Diagnosis present

## 2012-06-12 DIAGNOSIS — I10 Essential (primary) hypertension: Secondary | ICD-10-CM | POA: Diagnosis not present

## 2012-06-12 DIAGNOSIS — R188 Other ascites: Secondary | ICD-10-CM | POA: Diagnosis present

## 2012-06-12 DIAGNOSIS — R509 Fever, unspecified: Secondary | ICD-10-CM | POA: Diagnosis present

## 2012-06-12 HISTORY — DX: Right upper quadrant pain: R10.11

## 2012-06-12 HISTORY — DX: Other ascites: R18.8

## 2012-06-12 LAB — PROTIME-INR: Prothrombin Time: 14.2 seconds (ref 11.6–15.2)

## 2012-06-12 LAB — CBC WITH DIFFERENTIAL/PLATELET
Basophils Absolute: 0 10*3/uL (ref 0.0–0.1)
Basophils Relative: 0 % (ref 0–1)
Eosinophils Absolute: 0.3 10*3/uL (ref 0.0–0.7)
Hemoglobin: 9.9 g/dL — ABNORMAL LOW (ref 12.0–15.0)
MCH: 30.9 pg (ref 26.0–34.0)
MCHC: 32.8 g/dL (ref 30.0–36.0)
Monocytes Relative: 6 % (ref 3–12)
Neutro Abs: 7.8 10*3/uL — ABNORMAL HIGH (ref 1.7–7.7)
Neutrophils Relative %: 68 % (ref 43–77)
Platelets: 551 10*3/uL — ABNORMAL HIGH (ref 150–400)
RDW: 13.8 % (ref 11.5–15.5)

## 2012-06-12 LAB — APTT: aPTT: 38 seconds — ABNORMAL HIGH (ref 24–37)

## 2012-06-12 LAB — URINALYSIS, ROUTINE W REFLEX MICROSCOPIC
Leukocytes, UA: NEGATIVE
Protein, ur: NEGATIVE mg/dL
Specific Gravity, Urine: 1.04 — ABNORMAL HIGH (ref 1.005–1.030)
Urobilinogen, UA: 0.2 mg/dL (ref 0.0–1.0)

## 2012-06-12 LAB — COMPREHENSIVE METABOLIC PANEL
AST: 18 U/L (ref 0–37)
Albumin: 3.5 g/dL (ref 3.5–5.2)
Alkaline Phosphatase: 101 U/L (ref 39–117)
BUN: 17 mg/dL (ref 6–23)
Chloride: 101 mEq/L (ref 96–112)
Potassium: 4.5 mEq/L (ref 3.5–5.1)
Total Protein: 7.9 g/dL (ref 6.0–8.3)

## 2012-06-12 LAB — MAGNESIUM: Magnesium: 2.1 mg/dL (ref 1.5–2.5)

## 2012-06-12 MED ORDER — ONDANSETRON HCL 4 MG/2ML IJ SOLN
2.0000 mg | INTRAMUSCULAR | Status: DC | PRN
Start: 2012-06-12 — End: 2012-06-16
  Administered 2012-06-12 – 2012-06-15 (×3): 4 mg via INTRAVENOUS
  Filled 2012-06-12 (×4): qty 2

## 2012-06-12 MED ORDER — OXYBUTYNIN CHLORIDE 5 MG PO TABS
5.0000 mg | ORAL_TABLET | Freq: Two times a day (BID) | ORAL | Status: DC
  Administered 2012-06-13 – 2012-06-16 (×6): 5 mg via ORAL
  Filled 2012-06-12 (×10): qty 1

## 2012-06-12 MED ORDER — MORPHINE SULFATE 2 MG/ML IJ SOLN
1.0000 mg | INTRAMUSCULAR | Status: DC | PRN
  Administered 2012-06-12: 2 mg via INTRAVENOUS
  Filled 2012-06-12 (×2): qty 1

## 2012-06-12 MED ORDER — IOHEXOL 300 MG/ML  SOLN
100.0000 mL | Freq: Once | INTRAMUSCULAR | Status: AC | PRN
  Administered 2012-06-12: 100 mL via INTRAVENOUS

## 2012-06-12 MED ORDER — ONE-DAILY MULTI VITAMINS PO TABS: 1.0000 | ORAL_TABLET | Freq: Every day | ORAL | Status: DC

## 2012-06-12 MED ORDER — LISINOPRIL 10 MG PO TABS
10.0000 mg | ORAL_TABLET | Freq: Every day | ORAL | Status: DC
  Administered 2012-06-14 – 2012-06-16 (×2): 10 mg via ORAL
  Filled 2012-06-12 (×4): qty 1

## 2012-06-12 MED ORDER — ACETAMINOPHEN 325 MG PO TABS
650.0000 mg | ORAL_TABLET | Freq: Four times a day (QID) | ORAL | Status: DC | PRN
  Administered 2012-06-12 – 2012-06-15 (×2): 650 mg via ORAL
  Filled 2012-06-12 (×2): qty 2

## 2012-06-12 MED ORDER — ADULT MULTIVITAMIN W/MINERALS CH
1.0000 | ORAL_TABLET | Freq: Every day | ORAL | Status: DC
  Administered 2012-06-14 – 2012-06-16 (×3): 1 via ORAL
  Filled 2012-06-12 (×4): qty 1

## 2012-06-12 MED ORDER — PIPERACILLIN-TAZOBACTAM 3.375 G IVPB
3.3750 g | Freq: Three times a day (TID) | INTRAVENOUS | Status: DC
  Administered 2012-06-12 – 2012-06-16 (×12): 3.375 g via INTRAVENOUS
  Filled 2012-06-12 (×16): qty 50

## 2012-06-12 MED ORDER — POTASSIUM CHLORIDE IN NACL 20-0.9 MEQ/L-% IV SOLN
INTRAVENOUS | Status: DC
  Administered 2012-06-12: 17:00:00 via INTRAVENOUS
  Filled 2012-06-12 (×8): qty 1000

## 2012-06-12 MED ORDER — INTERFERON BETA-1B 0.3 MG ~~LOC~~ KIT: 0.2500 mg | PACK | SUBCUTANEOUS | Status: DC

## 2012-06-12 MED ORDER — FERROUS SULFATE 325 (65 FE) MG PO TABS
325.0000 mg | ORAL_TABLET | Freq: Every day | ORAL | Status: DC
  Administered 2012-06-14 – 2012-06-16 (×3): 325 mg via ORAL
  Filled 2012-06-12 (×5): qty 1

## 2012-06-12 NOTE — H&P (Signed)
B subcutaneous abdominal wall abscesses - L recurrent.  Too tender to I&D at the bedside.  Will plan incision and drainage of both in the OR tomorrow.  Procedure, risks, and benefits D/W her and her husband.  She agrees.  I will also D/W Dr. Donell Beers. Patient examined and I agree with the assessment and plan  Jennifer Gelinas, MD, MPH, FACS Pager: [phone]  06/12/2012 5:33 PM

## 2012-06-12 NOTE — Telephone Encounter (Signed)
Per Dr Jamey Ripa, I called [facility] and spoke to Walker and asked them to inform the pt to go to [facility] Admitting.

## 2012-06-12 NOTE — H&P (Signed)
[name] is an 57 y.o. female.   Chief Complaint: Right sided abdominal pain HPI: Patient is a 57 year old female with a history of multiple sclerosis. She has a history of Betaseron injections, every other day. She was first seen in our office by Dr. Cyndia Bent on 05/05/12 with a left upper quadrant fluid collection which was uncomfortable. She underwent aspiration of the site, in the office;   then IR drainage of the site by DR. Fredia Sorrow.  Cultures from the office aspiration, and then cultures obtained during the IR procedure showed no growth. She was treated with Ancef, and discharged home on Keflex. The left side improved with this treatment. She returned on 06/01/12 with pain on the right side. No cultures were obtained during this admission she was treated 1 dose of Rocephin;  then started on Zosyn. She was discharged 48 hours later on by mouth Augmentin. She continues to have discomfort on the right side,  she's had no improvement on PO antibiotics.  Currentlhy it hurts to move. She returned to the office today was seen by Dr. Jamey Ripa. A CT scan showed minimal change in the anterior abdominal wall fluid collection on the left. There was a new collection of fluid within the right anterior abdominal wall abutting the abdominal wall musculature with no communication to the peritoneal cavity. The site has a thick walled, somewhat irregular in outline,  and could possibly be infected. She was admitted to the [facility] for further evaluation and treatment.   Past Medical History  Diagnosis Date  . Hypertension   . Multiple sclerosis, currently she is without any symptoms, aside from weakness.    1972  . Self-catheterizes urinary bladder ~ 2012    "sphincter in bladder doesn't work properly due to my MS" (05/17/2012  . History of recurrent UTIs   . History of blood transfusion ~ 2012    "after multiple UTI's; got bladder infection; passed alot of blood" (05/05/2012)  . Headache    "several/wk" (05/17/2012)  . 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 Abdominal fluid collection 05/07/2012    Past Surgical History  Procedure Laterality Date  . Vaginal hysterectomy  2011    Family History  Problem Relation Age of Onset  . Heart disease Mother   . Congestive Heart Failure Mother   . Heart disease Father   . Stroke Father   . Heart disease Brother   . Heart attack Brother    Prior to Admission medications   Medication Sig Start Date End Date Taking? Authorizing Provider  acetaminophen (TYLENOL) 325 MG tablet Take 650 mg by mouth every 6 (six) hours as needed for pain or fever.    Historical Provider, MD  amoxicillin-clavulanate (AUGMENTIN) 875-125 MG per tablet Take 1 tablet by mouth every 12 (twelve) hours. 06/03/12   Phebe Colla, MD  Calcium Carbonate-Vitamin D (CALCIUM 600 + D PO) Take 1 tablet by mouth daily.    Historical Provider, MD  ferrous sulfate 325 (65 FE) MG tablet Take 325 mg by mouth daily with breakfast.    Historical Provider, MD  Interferon Beta-1b (BETASERON) 0.3 MG KIT injection Inject 0.25 mg into the skin every other day.    Historical Provider, MD  lisinopril (PRINIVIL,ZESTRIL) 10 MG tablet Take 10 mg by mouth daily. 06/02/12   Historical Provider, MD  Misc Natural Products (OSTEO BI-FLEX ADV JOINT SHIELD PO) Take 1 tablet by mouth daily.    Historical Provider, MD  Multiple Vitamin (MULTIVITAMIN) tablet Take 1 tablet  by mouth daily.    Historical Provider, MD  ondansetron (ZOFRAN) 4 MG tablet Take 4 mg by mouth every 8 (eight) hours as needed for nausea.    Historical Provider, MD  oxybutynin (DITROPAN) 5 MG tablet Take 5 mg by mouth 2 (two) times daily. 06/02/12   Historical Provider, MD    Social History:  reports that she has never smoked. She has never used smokeless tobacco. She reports that she does not drink alcohol or use illicit drugs.  Allergies:  Allergies  Allergen Reactions  . Oxycodone Nausea And Vomiting    Medications Prior to Admission    Medication Sig Dispense Refill  . acetaminophen (TYLENOL) 325 MG tablet Take 650 mg by mouth every 6 (six) hours as needed for pain or fever.      Marland Kitchen amoxicillin-clavulanate (AUGMENTIN) 875-125 MG per tablet Take 1 tablet by mouth every 12 (twelve) hours.  23 tablet  0  . Calcium Carbonate-Vitamin D (CALCIUM 600 + D PO) Take 1 tablet by mouth daily.      . ferrous sulfate 325 (65 FE) MG tablet Take 325 mg by mouth daily with breakfast.      . Interferon Beta-1b (BETASERON) 0.3 MG KIT injection Inject 0.25 mg into the skin every other day.      . lisinopril (PRINIVIL,ZESTRIL) 10 MG tablet Take 10 mg by mouth daily.      . Misc Natural Products (OSTEO BI-FLEX ADV JOINT SHIELD PO) Take 1 tablet by mouth daily.      . Multiple Vitamin (MULTIVITAMIN) tablet Take 1 tablet by mouth daily.      . ondansetron (ZOFRAN) 4 MG tablet Take 4 mg by mouth every 8 (eight) hours as needed for nausea.      Marland Kitchen oxybutynin (DITROPAN) 5 MG tablet Take 5 mg by mouth 2 (two) times daily.        No results found for this or any previous visit (from the past 48 hour(s)). Ct Abdomen Pelvis W Contrast  06/12/2012   *RADIOLOGY REPORT*  Clinical Data: Right upper quadrant pain, history of multiple sclerosis, prior fluid collections subcutaneously secondary to subcutaneous injections.  Evaluate for abscess  CT ABDOMEN AND PELVIS WITH CONTRAST  Technique:  Multidetector CT imaging of the abdomen and pelvis was performed following the standard protocol during bolus administration of intravenous contrast.  Contrast: OMNIPAQUE IOHEXOL 300 MG/ML  SOLN  Comparison: CT abdomen pelvis of 06/01/2012  Findings: The lung bases are clear.  The liver enhances with no focal abnormality and no ductal dilatation is seen.  No calcified gallstones are noted.  The pancreas is normal in size and the pancreatic duct is not dilated.  The adrenal glands and spleen are stable.  The stomach is moderately fluid distended with no abnormality noted.  The  kidneys enhance with no calculus or mass and no hydronephrosis is seen.  The abdominal aorta is normal in caliber.  No adenopathy is noted.  The previously noted fluid collection within the subcutaneous tissues of the left anterior abdomen wall anterior to the abdominal wall musculature is again noted and appears somewhat thick-walled. This collection measures 4.9 x 2.2 x 6.5 cm.  There is however a new collection of fluid located in the right anterior abdominal subcutaneous tissues abutting the anterior abdominal wall musculature measuring 6.5 x 2.0 by 13.7 cm.  These most likely represent abscesses, being thick-walled and somewhat irregular in outline.  Neither of these collections appears to communicate with the peritoneal cavity, and  neither of these collections contains air.  The urinary bladder is decompressed.  The uterus has previously been resected.  No adnexal lesion is seen.  No fluid is noted within the pelvis.  There is feces throughout the colon.  No bony abnormality is seen.  There may be minimal compression deformity of T12 vertebral body which appears to have been present on CT from February 2012.  IMPRESSION:  1.  Little change in anterior abdominal wall subcutaneous fluid collection in the left abdomen as noted above. 2.  New fluid collection within the right anterior abdominal wall abutting the abdominal wall musculature.  No communication with the peritoneal cavity is seen.  These superficial anterior abdominal wall fluid collections most likely represent abscesses.   Original Report Authenticated By: Dwyane Dee, M.D.    Review of Systems  Constitutional: Positive for weight loss (about 3 pounds since discharge per her husband.). Negative for fever, chills, malaise/fatigue and diaphoresis.  HENT: Negative.   Eyes: Negative.   Respiratory: Negative.   Cardiovascular: Negative.   Gastrointestinal: Positive for abdominal pain and diarrhea (loose stools on abx). Negative for constipation,  blood in stool and melena.       She is tender and has 2 palpable sites in the abdominal wall.  The left side is non tender and about 6-7 cm long on palpation.  No skin erythema noted. On the right side there is a site about 7-8 cm long again just above the umbilicus.  There is no erythema of the skin anyplace on her.It hurts for her to move around, motion or without motion, it also hurts to palpate the site.   Genitourinary:       She self caths due to MS  Musculoskeletal: Negative.  Joint pain: Pain in the right abdominal area just above and to the right of the umbilicus.       She knows left side is weaker than right when tested by the neurologist.  Skin: Negative.        She is tender and has 2 palpable sites in the abdominal wall.  The left side is non tender and about 6-7 cm long on palpation.  No skin erythema noted. On the right side there is a site about 7-8 cm long again just above the umbilicus.  There is no erythema of the skin anyplace on her.It hurts for her to move around, motion or without motion, it also hurts to palpate the site.  Neurological: Positive for loss of consciousness (last year). Negative for weakness.       Currently in remission from her MS   Endo/Heme/Allergies: Negative.   Psychiatric/Behavioral: Negative.     There were no vitals taken for this visit. Physical Exam  Constitutional: She is oriented to person, place, and time. She appears well-developed. No distress.  BP 144/63  Pulse 85  Temp(Src) 97.7 F (36.5 C)  Resp 18  Ht 5\' 1"  (1.549 m)  Wt 56.7 kg (125 lb)  BMI 23.63 kg/m2  SpO2 100%  Thin NAD,    HENT:  Head: Normocephalic and atraumatic.  Nose: Nose normal.  Mouth/Throat: No oropharyngeal exudate.  Eyes: Conjunctivae and EOM are normal. Pupils are equal, round, and reactive to light. Right eye exhibits no discharge. Left eye exhibits no discharge. No scleral icterus.  Neck: Normal range of motion. Neck supple. No JVD present. No tracheal  deviation present. No thyromegaly present.  Cardiovascular: Normal rate, regular rhythm, normal heart sounds and intact distal pulses.  Exam  reveals no gallop.   No murmur heard. Respiratory: Effort normal and breath sounds normal. No respiratory distress. She has no wheezes. She has no rales. She exhibits no tenderness.  GI: She exhibits no distension and no mass. There is tenderness (Right side only). There is no rebound and no guarding.    Musculoskeletal: Normal range of motion. She exhibits no edema.  Lymphadenopathy:    She has no cervical adenopathy.  Neurological: She is alert and oriented to person, place, and time. No cranial nerve deficit.  Skin: Skin is warm and dry. She is not diaphoretic.     Assessment/Plan 1. Abdominal wall fluid collection right greater than left with tenderness and pain on the right. CT scan concerning for possible infected abdominal wall fluid collection  2. Multiple sclerosis 3. History of recurrent UTIs self cathing.  Plan: Verlon Au going to keep her n.p.o., she had some breakfast at 8:30 AM and oral contrast at 10:30 AM she's been n.p.o. Since.  Will review exam and CT with Dr. Janee Morn. We discussed then with taking her to the or for drain for possible IR intervention on the right which is her painful side.   Bartolo Montanye 06/12/2012, 2:27 PM

## 2012-06-12 NOTE — Patient Instructions (Signed)
We will obtain a CT scan today and depending on the results we may have to put you back in the hospital to manage the area where you have some infection in the right side of your abdomen.

## 2012-06-12 NOTE — Telephone Encounter (Signed)
Stephanie at [facility] called Ct report of Little change in anterior wall subq fluid collection left abd. New fluid collection in right anterior abd wall abutting abd wall musculature. No communication with peritoneal cavity. Collections likely represent abscess. Report called to Dr Janee Morn. He will call [facility] [facility] with direction.

## 2012-06-12 NOTE — Progress Notes (Signed)
[name]       DOB: Dec 16, 1955           DATE: 06/12/2012       ZOX:[id number]  CC:  Chief Complaint  Patient presents with  . Follow-up    reck abd wall fluid collection    HPI: she comes in for followup of a right abdominal phlegmon. She was hospitalized for this and went home several days ago. She's been on oral antibiotics. She hasn't really improved in terms of her right upper quadrant pain and in fact feels somewhat worse over the last 2 days. She is not having fever or chills. Her bowels have been working okay. She was originally seen for a large fluid collection in the left subcostal area. However this area is currently asymptomatic. That area was previously percutaneously drained but another fluid collection occurred but did not appear to be infected.  EXAM: Vital signs: BP 134/78  Pulse 78  Temp(Src) 96.5 F (35.8 C) (Temporal)  Ht 5\' 1"  (1.549 m)  Wt 125 lb 12.8 oz (57.063 kg)  BMI 23.78 kg/m2  SpO2 98%  General: Patient alert, oriented, NAD  Abdomen: Soft she has tenderness in the right upper quadrant. There is no external evidence of inflammatory changes. I think there is a subcutaneous mass in the left subcostal area consistent with a fluid collection. This is not particularly tender. The abdomen is otherwise soft. IMP: she appears to still be with some infectious process in the right abdominal wall of uncertain etiology.  PLAN: I think we need to get another CT scan to assess what is going on. She relates to me that there had been some discussion about surgical drainage of this area but since she seemed initially to improve with antibiotics it was decided to see if a long course of antibiotics would resolve this infection.  Bailey Kolbe J 06/12/2012

## 2012-06-13 ENCOUNTER — Inpatient Hospital Stay (HOSPITAL_COMMUNITY): Payer: Medicare Other | Admitting: Anesthesiology

## 2012-06-13 ENCOUNTER — Encounter (HOSPITAL_COMMUNITY): Payer: Self-pay | Admitting: Anesthesiology

## 2012-06-13 ENCOUNTER — Encounter (HOSPITAL_COMMUNITY): Admission: AD | Disposition: A | Discharge status: Home / Self Care | Payer: Self-pay | Source: Ambulatory Visit

## 2012-06-13 DIAGNOSIS — L03319 Cellulitis of trunk, unspecified: Secondary | ICD-10-CM

## 2012-06-13 DIAGNOSIS — L02219 Cutaneous abscess of trunk, unspecified: Secondary | ICD-10-CM

## 2012-06-13 DIAGNOSIS — R1011 Right upper quadrant pain: Secondary | ICD-10-CM

## 2012-06-13 HISTORY — PX: WOUND DEBRIDEMENT: SHX247

## 2012-06-13 LAB — SURGICAL PCR SCREEN
MRSA, PCR: NEGATIVE
Staphylococcus aureus: NEGATIVE

## 2012-06-13 SURGERY — DEBRIDEMENT, WOUND, ABDOMEN
Anesthesia: General | Laterality: Bilateral | Wound class: Dirty or Infected

## 2012-06-13 MED ORDER — ONDANSETRON HCL 4 MG/2ML IJ SOLN: 4.0000 mg | Freq: Once | INTRAMUSCULAR | Status: DC | PRN

## 2012-06-13 MED ORDER — 0.9 % SODIUM CHLORIDE (POUR BTL) OPTIME
TOPICAL | Status: DC | PRN
  Administered 2012-06-13: 1000 mL

## 2012-06-13 MED ORDER — HYDROCODONE-ACETAMINOPHEN 5-325 MG PO TABS
ORAL_TABLET | ORAL | Status: AC
  Filled 2012-06-13: qty 2

## 2012-06-13 MED ORDER — HYDROMORPHONE HCL PF 1 MG/ML IJ SOLN
0.2500 mg | INTRAMUSCULAR | Status: DC | PRN
  Administered 2012-06-13 (×3): 0.5 mg via INTRAVENOUS

## 2012-06-13 MED ORDER — OXYCODONE HCL 5 MG/5ML PO SOLN: 5.0000 mg | Freq: Once | ORAL | Status: DC | PRN

## 2012-06-13 MED ORDER — FENTANYL CITRATE 0.05 MG/ML IJ SOLN
INTRAMUSCULAR | Status: DC | PRN
  Administered 2012-06-13 (×3): 50 ug via INTRAVENOUS

## 2012-06-13 MED ORDER — MORPHINE SULFATE 2 MG/ML IJ SOLN
1.0000 mg | INTRAMUSCULAR | Status: DC | PRN
  Administered 2012-06-13 – 2012-06-15 (×6): 2 mg via INTRAVENOUS
  Filled 2012-06-13 (×5): qty 1

## 2012-06-13 MED ORDER — MIDAZOLAM HCL 5 MG/5ML IJ SOLN
INTRAMUSCULAR | Status: DC | PRN
  Administered 2012-06-13: 2 mg via INTRAVENOUS

## 2012-06-13 MED ORDER — TRAMADOL HCL 50 MG PO TABS: 50.0000 mg | ORAL_TABLET | Freq: Four times a day (QID) | ORAL | Status: DC | PRN

## 2012-06-13 MED ORDER — HYDROMORPHONE HCL PF 1 MG/ML IJ SOLN
INTRAMUSCULAR | Status: AC
  Filled 2012-06-13: qty 1

## 2012-06-13 MED ORDER — LACTATED RINGERS IV SOLN
INTRAVENOUS | Status: DC | PRN
  Administered 2012-06-13: 09:00:00 via INTRAVENOUS

## 2012-06-13 MED ORDER — OXYCODONE HCL 5 MG PO TABS: 5.0000 mg | ORAL_TABLET | Freq: Once | ORAL | Status: DC | PRN

## 2012-06-13 MED ORDER — LIDOCAINE HCL (CARDIAC) 20 MG/ML IV SOLN
INTRAVENOUS | Status: DC | PRN
  Administered 2012-06-13: 100 mg via INTRAVENOUS

## 2012-06-13 MED ORDER — MEPERIDINE HCL 25 MG/ML IJ SOLN: 6.2500 mg | INTRAMUSCULAR | Status: DC | PRN

## 2012-06-13 MED ORDER — PROPOFOL 10 MG/ML IV BOLUS
INTRAVENOUS | Status: DC | PRN
  Administered 2012-06-13: 120 mg via INTRAVENOUS

## 2012-06-13 MED ORDER — HYDROCODONE-ACETAMINOPHEN 5-325 MG PO TABS
1.0000 | ORAL_TABLET | ORAL | Status: DC | PRN
  Administered 2012-06-13: 1 via ORAL
  Administered 2012-06-13 – 2012-06-14 (×3): 2 via ORAL
  Filled 2012-06-13: qty 2
  Filled 2012-06-13: qty 1
  Filled 2012-06-13: qty 2

## 2012-06-13 MED ORDER — ARTIFICIAL TEARS OP OINT
TOPICAL_OINTMENT | OPHTHALMIC | Status: DC | PRN
  Administered 2012-06-13: 1 via OPHTHALMIC

## 2012-06-13 MED ORDER — ONDANSETRON HCL 4 MG/2ML IJ SOLN
INTRAMUSCULAR | Status: DC | PRN
  Administered 2012-06-13: 4 mg via INTRAVENOUS

## 2012-06-13 SURGICAL SUPPLY — 15 items
BANDAGE GAUZE ELAST BULKY 4 IN (GAUZE/BANDAGES/DRESSINGS) ×4 IMPLANT
DRAPE LAPAROSCOPIC ABDOMINAL (DRAPES) ×2 IMPLANT
DRSG PAD ABDOMINAL 8X10 ST (GAUZE/BANDAGES/DRESSINGS) ×4 IMPLANT
GLOVE BIO SURGEON STRL SZ 6 (GLOVE) ×2 IMPLANT
GLOVE BIOGEL PI IND STRL 6.5 (GLOVE) ×2 IMPLANT
GLOVE BIOGEL PI INDICATOR 6.5 (GLOVE) ×2
GOWN PREVENTION PLUS XXLARGE (GOWN DISPOSABLE) ×2 IMPLANT
GOWN STRL NON-REIN LRG LVL3 (GOWN DISPOSABLE) ×2 IMPLANT
KIT BASIN OR (CUSTOM PROCEDURE TRAY) ×2 IMPLANT
NS IRRIG 1000ML POUR BTL (IV SOLUTION) ×2 IMPLANT
PACK GENERAL/GYN (CUSTOM PROCEDURE TRAY) ×2 IMPLANT
SPONGE GAUZE 4X4 12PLY (GAUZE/BANDAGES/DRESSINGS) ×4 IMPLANT
TAPE CLOTH SURG 6X10 WHT LF (GAUZE/BANDAGES/DRESSINGS) ×2 IMPLANT
TOWEL OR 17X24 6PK STRL BLUE (TOWEL DISPOSABLE) ×2 IMPLANT
TOWEL OR 17X26 10 PK STRL BLUE (TOWEL DISPOSABLE) ×2 IMPLANT

## 2012-06-13 NOTE — Interval H&P Note (Signed)
History and Physical Interval Note:  06/13/2012 9:13 AM  [name]  has presented today for surgery, with the diagnosis of abscess  The various methods of treatment have been discussed with the patient and family. After consideration of risks, benefits and other options for treatment, the patient has consented to  Procedure(s): DEBRIDEMENT ABDOMINAL WOUND as a surgical intervention .  The patient's history has been reviewed, patient examined, no change in status, stable for surgery.  I have reviewed the patient's chart and labs.  Questions were answered to the patient's satisfaction.     Phillp Dolores

## 2012-06-13 NOTE — Anesthesia Preprocedure Evaluation (Addendum)
Anesthesia Evaluation  Patient identified by MRN, date of birth, ID band Patient awake    Reviewed: Allergy & Precautions, H&P , NPO status , Patient's Chart, lab work & pertinent test results  History of Anesthesia Complications Negative for: history of anesthetic complications  Airway Mallampati: I TM Distance: >3 FB Neck ROM: Full    Dental  (+) Partial Lower, Teeth Intact and Dental Advisory Given   Pulmonary neg pulmonary ROS,          Cardiovascular hypertension, Pt. on medications     Neuro/Psych  Headaches,  Neuromuscular disease (MS with generalized weakness--is in remission ) negative psych ROS   GI/Hepatic Neg liver ROS, Recurrent fluid collection on abdominal wall   Endo/Other  negative endocrine ROS  Renal/GU negative Renal ROS Bladder dysfunction (self cath--retention r/t MS)      Musculoskeletal   Abdominal   Peds  Hematology  (+) Blood dyscrasia, anemia ,   Anesthesia Other Findings   Reproductive/Obstetrics negative OB ROS                       Anesthesia Physical Anesthesia Plan  ASA: III  Anesthesia Plan: General   Post-op Pain Management:    Induction: Intravenous  Airway Management Planned: Oral ETT  Additional Equipment:   Intra-op Plan:   Post-operative Plan: Extubation in OR  Informed Consent: I have reviewed the patients History and Physical, chart, labs and discussed the procedure including the risks, benefits and alternatives for the proposed anesthesia with the patient or authorized representative who has indicated his/her understanding and acceptance.     Plan Discussed with: CRNA and Surgeon  Anesthesia Plan Comments:         Anesthesia Quick Evaluation

## 2012-06-13 NOTE — Anesthesia Procedure Notes (Signed)
Procedure Name: LMA Insertion Date/Time: 06/13/2012 9:27 AM Performed by: Leona Singleton A Pre-anesthesia Checklist: Patient identified Patient Re-evaluated:Patient Re-evaluated prior to inductionOxygen Delivery Method: Circle system utilized Preoxygenation: Pre-oxygenation with 100% oxygen Intubation Type: IV induction Ventilation: Mask ventilation without difficulty LMA Size: 4.0 Number of attempts: 1 Placement Confirmation: positive ETCO2 and breath sounds checked- equal and bilateral Tube secured with: Tape Dental Injury: Teeth and Oropharynx as per pre-operative assessment

## 2012-06-13 NOTE — Transfer of Care (Signed)
Immediate Anesthesia Transfer of Care Note  Patient: [name]  Procedure(s) Performed: Procedure(s): DEBRIDEMENT ABDOMINAL WOUND (Bilateral)  Patient Location: PACU  Anesthesia Type:General  Level of Consciousness: awake, alert , oriented and patient cooperative  Airway & Oxygen Therapy: Patient Spontanous Breathing and Patient connected to nasal cannula oxygen  Post-op Assessment: Report given to PACU RN and Post -op Vital signs reviewed and stable  Post vital signs: Reviewed and stable  Complications: No apparent anesthesia complications

## 2012-06-13 NOTE — Op Note (Signed)
Incision and Drainage Complex Bilateral abdominal wall abscess  Pre-operative Diagnosis: Bilateral abdominal wall abscess   Post-operative Diagnosis: same   Indications: fevers, bilateral abdominal wall fluid collections.  Anesthesia: General   Procedure Details  The procedure, risks and complications have been discussed in detail (including, infection, bleeding, need for additional procedures) with the patient, and the patient has signed consent to the procedure. The patient was informed that the wound would be left open.  The patient was taken to OR #2 and general anesthesia was induced. The patient was placed into supine position.  The skin was sterilely prepped and draped over the affected area in the usual fashion. Time out was performed according to the surgical safety checklist.   The areas of concern were aspirated with a needle to determine location of fluid.  Fluid sent from each side for culture.  I&D with a #11 blade was performed on the left subcostal/left upper quadrant region. This was over the firm area that was palpable.  A hemostat was used to break into the thick walled fluid collection.  Fluid was serous, non cloudy.   Cultures were sent. The cavity was explored.  Several septations were opened.  Additional skin was opened to facilitate packing.    The right side was approached similarly.  Both cavities were quite large, at least 10 cm on each side.  The left side had some firm tissue that was taken as biopsy.  The wounds were packed with Kerlix.  The patient was awakened from anesthesia and taken to the PACU in stable condition.   Findings:  Serous fluid, fluid collections on top of fascia  EBL: min   Drains: None   Condition: Tolerated procedure well   Complications:  none known.

## 2012-06-13 NOTE — Preoperative (Signed)
Beta Blockers   Reason not to administer Beta Blockers:Not Applicable 

## 2012-06-13 NOTE — Anesthesia Postprocedure Evaluation (Signed)
Anesthesia Post Note  Patient: [name]  Procedure(s) Performed: Procedure(s) (LRB): DEBRIDEMENT ABDOMINAL WOUND (Bilateral)  Anesthesia type: general  Patient location: PACU  Post pain: Pain level controlled  Post assessment: Patient's Cardiovascular Status Stable  Last Vitals:  Filed Vitals:   06/13/12 1015  BP: 148/79  Pulse: 95  Temp:   Resp:     Post vital signs: Reviewed and stable  Level of consciousness: sedated  Complications: No apparent anesthesia complications

## 2012-06-14 NOTE — Progress Notes (Signed)
1 Day Post-Op  Subjective: Nauseated this AM.  But is doing pretty well. She tolerated the dressing change well after pain medicine.  Objective: Vital signs in last 24 hours: Temp:  [97.9 F (36.6 C)-98.8 F (37.1 C)] 98.8 F (37.1 C) (05/18 0710) Pulse Rate:  [76-104] 104 (05/18 0710) Resp:  [12-16] 16 (05/18 0710) BP: (114-153)/(46-79) 114/63 mmHg (05/18 0710) SpO2:  [95 %-100 %] 99 % (05/18 0710) Last BM Date: 06/10/12 Diet: regular Afebrile since her surgery. No labs Cultures pending Intake/Output from previous day: 05/17 0701 - 05/18 0700 In: 3216.3 [I.V.:3216.3] Out: 25 [Blood:25] Intake/Output this shift:    General appearance: alert, cooperative and nauseated. Resp: clear to auscultation bilaterally GI: soft, non-tender; bowel sounds normal; no masses,  no organomegaly and Open wounds are clean and dressing removed, will start bid wet to dry dressing changes.  Lab Results:   Recent Labs  06/12/12 1518  WBC 11.5*  HGB 9.9*  HCT 30.2*  PLT 551*    BMET  Recent Labs  06/12/12 1518  NA 137  K 4.5  CL 101  CO2 27  GLUCOSE 104*  BUN 17  CREATININE 0.74  CALCIUM 9.4   PT/INR  Recent Labs  06/12/12 1518  LABPROT 14.2  INR 1.11     Recent Labs Lab 06/12/12 1518  AST 18  ALT 10  ALKPHOS 101  BILITOT 0.1*  PROT 7.9  ALBUMIN 3.5     Lipase     Component Value Date/Time   LIPASE 22 06/01/2012 0910     Studies/Results: Ct Abdomen Pelvis W Contrast  06/12/2012   *RADIOLOGY REPORT*  Clinical Data: Right upper quadrant pain, history of multiple sclerosis, prior fluid collections subcutaneously secondary to subcutaneous injections.  Evaluate for abscess  CT ABDOMEN AND PELVIS WITH CONTRAST  Technique:  Multidetector CT imaging of the abdomen and pelvis was performed following the standard protocol during bolus administration of intravenous contrast.  Contrast: OMNIPAQUE IOHEXOL 300 MG/ML  SOLN  Comparison: CT abdomen pelvis of 06/01/2012   Findings: The lung bases are clear.  The liver enhances with no focal abnormality and no ductal dilatation is seen.  No calcified gallstones are noted.  The pancreas is normal in size and the pancreatic duct is not dilated.  The adrenal glands and spleen are stable.  The stomach is moderately fluid distended with no abnormality noted.  The kidneys enhance with no calculus or mass and no hydronephrosis is seen.  The abdominal aorta is normal in caliber.  No adenopathy is noted.  The previously noted fluid collection within the subcutaneous tissues of the left anterior abdomen wall anterior to the abdominal wall musculature is again noted and appears somewhat thick-walled. This collection measures 4.9 x 2.2 x 6.5 cm.  There is however a new collection of fluid located in the right anterior abdominal subcutaneous tissues abutting the anterior abdominal wall musculature measuring 6.5 x 2.0 by 13.7 cm.  These most likely represent abscesses, being thick-walled and somewhat irregular in outline.  Neither of these collections appears to communicate with the peritoneal cavity, and neither of these collections contains air.  The urinary bladder is decompressed.  The uterus has previously been resected.  No adnexal lesion is seen.  No fluid is noted within the pelvis.  There is feces throughout the colon.  No bony abnormality is seen.  There may be minimal compression deformity of T12 vertebral body which appears to have been present on CT from February 2012.  IMPRESSION:  1.  Little change in anterior abdominal wall subcutaneous fluid collection in the left abdomen as noted above. 2.  New fluid collection within the right anterior abdominal wall abutting the abdominal wall musculature.  No communication with the peritoneal cavity is seen.  These superficial anterior abdominal wall fluid collections most likely represent abscesses.   Original Report Authenticated By: Dwyane Dee, M.D.    Medications: . ferrous sulfate  325  mg Oral Q breakfast  . Interferon Beta-1b  0.25 mg Subcutaneous QODAY  . lisinopril  10 mg Oral Daily  . multivitamin with minerals  1 tablet Oral Daily  . oxybutynin  5 mg Oral BID  . piperacillin-tazobactam (ZOSYN)  IV  3.375 g Intravenous Q8H    Assessment/Plan Bilateral abdominal wall abscess; s/p I&D of bilateral complex abdominal wall abscess. 06/13/12, Dr. Donell Beers Multiple sclerosis  History of recurrent UTIs self cathing.  Plan:  Wet to dry dressing changes, mobilize, continue antibiotics, await culture results.        LOS: 2 days    [name] 06/14/2012

## 2012-06-14 NOTE — Progress Notes (Signed)
MD at bedside changing dressing, pain medication administered.

## 2012-06-15 ENCOUNTER — Encounter (HOSPITAL_COMMUNITY): Payer: Self-pay | Admitting: General Surgery

## 2012-06-15 DIAGNOSIS — R1011 Right upper quadrant pain: Secondary | ICD-10-CM

## 2012-06-15 DIAGNOSIS — R188 Other ascites: Secondary | ICD-10-CM

## 2012-06-15 HISTORY — DX: Other ascites: R18.8

## 2012-06-15 HISTORY — DX: Right upper quadrant pain: R10.11

## 2012-06-15 NOTE — Progress Notes (Signed)
Agree Devlyn Retter, MD, MPH, FACS Pager: [phone]  

## 2012-06-15 NOTE — Progress Notes (Signed)
UR COMPLETED  

## 2012-06-15 NOTE — Progress Notes (Signed)
Instructed patient's husband on how to do dressing change.  Patient's husband did very well with performing dressing change with guidance.  Patient tolerated procedure well.

## 2012-06-15 NOTE — Care Management Note (Signed)
CARE MANAGEMENT NOTE 06/15/2012  Patient:  [name], [name]   Account Number:  [id number]  Date Initiated:  06/15/2012  Documentation initiated by:  Vance Peper  Subjective/Objective Assessment:   57 yr old female s/p I & D of bilateral abdominal wall abscesses.     Action/Plan:   CM spoke with patient concerning home health needs. Choice offered. patient's husband will also be available to learn how to do dressing changes.   Anticipated DC Date:  06/16/2012   Anticipated DC Plan:  HOME W HOME HEALTH SERVICES      DC Planning Services  CM consult      [facility] Choice  HOME HEALTH   Choice offered to / List presented to:  C-1 Patient        HH arranged  HH-1 RN      [facility] agency  [facility]   Status of service:  Completed, signed off Medicare Important Message given?   (If response is "NO", the following Medicare IM given date fields will be blank) Date Medicare IM given:   Date Additional Medicare IM given:    Discharge Disposition:  HOME W HOME HEALTH SERVICES  Per UR Regulation:    If discussed at Long Length of Stay Meetings, dates discussed:    Comments:

## 2012-06-15 NOTE — Progress Notes (Signed)
2 Days Post-Op  Subjective: Having some nausea this AM.  No other significant complaints.  Objective: Vital signs in last 24 hours: Temp:  [98.4 F (36.9 C)-100.2 F (37.9 C)] 98.4 F (36.9 C) (05/19 0539) Pulse Rate:  [93-110] 93 (05/19 0539) Resp:  [16] 16 (05/19 0539) BP: (96-110)/(48-54) 96/54 mmHg (05/19 0539) SpO2:  [99 %-100 %] 100 % (05/19 0539) Last BM Date: 06/13/12 Tm 100.2, VSS, no labs today, Diet: regular Intake/Output from previous day: 05/18 0701 - 05/19 0700 In: 400 [IV Piggyback:400] Out: -  Intake/Output this shift:    General appearance: alert, cooperative and no distress Resp: clear to auscultation bilaterally GI: soft, was able to eat some breakfast, + BS, +BM Saturday, Open incisions look good.  Dressing changed.  Lab Results:   Recent Labs  06/12/12 1518  WBC 11.5*  HGB 9.9*  HCT 30.2*  PLT 551*    BMET  Recent Labs  06/12/12 1518  NA 137  K 4.5  CL 101  CO2 27  GLUCOSE 104*  BUN 17  CREATININE 0.74  CALCIUM 9.4   PT/INR  Recent Labs  06/12/12 1518  LABPROT 14.2  INR 1.11     Recent Labs Lab 06/12/12 1518  AST 18  ALT 10  ALKPHOS 101  BILITOT 0.1*  PROT 7.9  ALBUMIN 3.5     Lipase     Component Value Date/Time   LIPASE 22 06/01/2012 0910     Studies/Results: No results found.  Medications: . ferrous sulfate  325 mg Oral Q breakfast  . Interferon Beta-1b  0.25 mg Subcutaneous QODAY  . lisinopril  10 mg Oral Daily  . multivitamin with minerals  1 tablet Oral Daily  . oxybutynin  5 mg Oral BID  . piperacillin-tazobactam (ZOSYN)  IV  3.375 g Intravenous Q8H    Assessment/Plan Bilateral abdominal wall abscess; s/p I&D of bilateral complex abdominal wall abscess. 06/13/12, Dr. Donell Beers  No growth from cultures of sites on 5/17. No path report so far. Day 3 if Zosyn yesterday. Multiple sclerosis  History of recurrent UTIs self cathing.   Plan;  Continue to mobilize, wet to dry dressing, start teach husband,  and begin to arrange home health.  Will wait for culture and path from Saturday, before making disposition to home.  LOS: 3 days    [name] 06/15/2012

## 2012-06-16 LAB — CULTURE, ROUTINE-ABSCESS: Culture: NO GROWTH

## 2012-06-16 MED ORDER — AMOXICILLIN-POT CLAVULANATE 875-125 MG PO TABS: 1.0000 | ORAL_TABLET | Freq: Two times a day (BID) | ORAL | Status: DC

## 2012-06-16 NOTE — Discharge Summary (Signed)
Patient ID: CHEYNA RETANA MRN: [id number] DOB/AGE: 1955-07-12 57 y.o.  Admit date: 06/12/2012 Discharge date: 06/16/2012  Procedures: Incision and drainage of bilateral abdominal wall fluid collections  Consults: None  Reason for Admission: Patient is a 57 year old female with a history of multiple sclerosis. She has a history of Betaseron injections, every other day. She was first seen in our office by Dr. Cyndia Bent on 05/05/12 with a left upper quadrant fluid collection which was uncomfortable. She underwent aspiration of the site, in the office; then IR drainage of the site by DR. Fredia Sorrow. Cultures from the office aspiration, and then cultures obtained during the IR procedure showed no growth. She was treated with Ancef, and discharged home on Keflex. The left side improved with this treatment. She returned on 06/01/12 with pain on the right side. No cultures were obtained during this admission she was treated 1 dose of Rocephin; then started on Zosyn. She was discharged 48 hours later on by mouth Augmentin. She continues to have discomfort on the right side, she's had no improvement on PO antibiotics. Currentlhy it hurts to move. She returned to the office today was seen by Dr. Jamey Ripa. A CT scan showed minimal change in the anterior abdominal wall fluid collection on the left. There was a new collection of fluid within the right anterior abdominal wall abutting the abdominal wall musculature with no communication to the peritoneal cavity. The site has a thick walled, somewhat irregular in outline, and could possibly be infected. She was admitted to the [facility] for further evaluation and treatment.   Admission Diagnoses:  1. Abdominal wall fluid collection right greater than left  2. Multiple sclerosis  3. History of recurrent UTIs self cathing.   Hospital Course: The patient was admitted and placed on zosyn.  She was taken to the operating room the following day and these two  fluid collections were surgically drained.  She tolerated the procedure well.  Cultures were once again sent, but showing no growth.  The cyst wall was sent to pathology but has not returned yet.  NS WD dressing changes were started postoperatively.  She tolerated these well.  She was stable for dc home on POD#2.  Discharge Diagnoses:  Principal Problem:   Abdominal wall fluid collections Active Problems:   Abdominal pain, RUQ (right upper quadrant) s/p I&D of LUQ and RUQ fluid collections MS   Discharge Medications:   Medication List    STOP taking these medications       BETASERON 0.3 MG Kit injection  Generic drug:  Interferon Beta-1b      TAKE these medications       amoxicillin-clavulanate 875-125 MG per tablet  Commonly known as:  AUGMENTIN  Take 1 tablet by mouth every 12 (twelve) hours.     aspirin-acetaminophen-caffeine 250-250-65 MG per tablet  Commonly known as:  EXCEDRIN MIGRAINE  Take 2 tablets by mouth every 6 (six) hours as needed for pain.     CALCIUM 600 + D PO  Take 1 tablet by mouth daily.     ferrous sulfate 325 (65 FE) MG tablet  Take 325 mg by mouth daily with breakfast.     lisinopril 10 MG tablet  Commonly known as:  PRINIVIL,ZESTRIL  Take 10 mg by mouth daily.     multivitamin tablet  Take 1 tablet by mouth daily.     OSTEO BI-FLEX ADV JOINT SHIELD PO  Take 1 tablet by mouth daily.     oxybutynin 5  MG tablet  Commonly known as:  DITROPAN  Take 5 mg by mouth 2 (two) times daily.        Discharge Instructions: Follow-up Information   Follow up with [facility], [place]. Schedule an appointment as soon as possible for a visit in 2 weeks.   Contact information:   [address] [place] [address] [phone]     Follow up with Dr. Harlen Labs to determine medication regimen for her MS.  We have stopped her Beta Seron injections for now as this seems to be the likely source of these fluid  collections.  Signed: Letha Cape 06/16/2012, 8:37 AM

## 2012-06-16 NOTE — Discharge Summary (Signed)
Agree with above 

## 2012-06-17 ENCOUNTER — Encounter (HOSPITAL_COMMUNITY): Payer: Self-pay | Admitting: *Deleted

## 2012-06-17 ENCOUNTER — Emergency Department (HOSPITAL_COMMUNITY): Payer: Medicare Other | Admitting: *Deleted

## 2012-06-17 ENCOUNTER — Inpatient Hospital Stay (HOSPITAL_COMMUNITY)
Admission: EM | Admit: 2012-06-17 | Discharge: 2012-06-24 | DRG: 330 | Disposition: A | Discharge status: Home / Self Care | Payer: Medicare Other | Attending: General Surgery | Admitting: General Surgery

## 2012-06-17 ENCOUNTER — Emergency Department (HOSPITAL_COMMUNITY): Payer: Medicare Other

## 2012-06-17 ENCOUNTER — Encounter (HOSPITAL_COMMUNITY)
Admission: EM | Disposition: A | Discharge status: Home / Self Care | Payer: Self-pay | Source: Home / Self Care | Attending: Surgery

## 2012-06-17 DIAGNOSIS — K560 Paralytic ileus: Secondary | ICD-10-CM | POA: Diagnosis present

## 2012-06-17 DIAGNOSIS — K929 Disease of digestive system, unspecified: Secondary | ICD-10-CM | POA: Diagnosis present

## 2012-06-17 DIAGNOSIS — K55059 Acute (reversible) ischemia of intestine, part and extent unspecified: Secondary | ICD-10-CM

## 2012-06-17 DIAGNOSIS — K565 Intestinal adhesions [bands], unspecified as to partial versus complete obstruction: Secondary | ICD-10-CM

## 2012-06-17 DIAGNOSIS — R000 Tachycardia, unspecified: Secondary | ICD-10-CM | POA: Diagnosis present

## 2012-06-17 DIAGNOSIS — Y849 Medical procedure, unspecified as the cause of abnormal reaction of the patient, or of later complication, without mention of misadventure at the time of the procedure: Secondary | ICD-10-CM | POA: Diagnosis present

## 2012-06-17 DIAGNOSIS — K56609 Unspecified intestinal obstruction, unspecified as to partial versus complete obstruction: Principal | ICD-10-CM | POA: Diagnosis not present

## 2012-06-17 DIAGNOSIS — R109 Unspecified abdominal pain: Secondary | ICD-10-CM

## 2012-06-17 DIAGNOSIS — R188 Other ascites: Secondary | ICD-10-CM | POA: Diagnosis present

## 2012-06-17 DIAGNOSIS — G35 Multiple sclerosis: Secondary | ICD-10-CM | POA: Diagnosis present

## 2012-06-17 DIAGNOSIS — N390 Urinary tract infection, site not specified: Secondary | ICD-10-CM | POA: Diagnosis present

## 2012-06-17 HISTORY — DX: Unspecified intestinal obstruction, unspecified as to partial versus complete obstruction: K56.609

## 2012-06-17 HISTORY — PX: LAPAROTOMY: SHX154

## 2012-06-17 HISTORY — DX: Myoneural disorder, unspecified: G70.9

## 2012-06-17 LAB — URINE MICROSCOPIC-ADD ON

## 2012-06-17 LAB — POCT I-STAT 3, VENOUS BLOOD GAS (G3P V)
Bicarbonate: 26.4 mEq/L — ABNORMAL HIGH (ref 20.0–24.0)
O2 Saturation: 48 %
Patient temperature: 98.3
TCO2: 28 mmol/L (ref 0–100)
pCO2, Ven: 38.4 mmHg — ABNORMAL LOW (ref 45.0–50.0)
pH, Ven: 7.445 — ABNORMAL HIGH (ref 7.250–7.300)

## 2012-06-17 LAB — CBC WITH DIFFERENTIAL/PLATELET
Basophils Relative: 0 % (ref 0–1)
Eosinophils Absolute: 0 10*3/uL (ref 0.0–0.7)
Eosinophils Relative: 0 % (ref 0–5)
Hemoglobin: 12.3 g/dL (ref 12.0–15.0)
Lymphocytes Relative: 4 % — ABNORMAL LOW (ref 12–46)
Monocytes Absolute: 0.8 10*3/uL (ref 0.1–1.0)
Neutrophils Relative %: 93 % — ABNORMAL HIGH (ref 43–77)
Platelets: 689 10*3/uL — ABNORMAL HIGH (ref 150–400)
RBC: 3.87 MIL/uL (ref 3.87–5.11)

## 2012-06-17 LAB — COMPREHENSIVE METABOLIC PANEL
ALT: 9 U/L (ref 0–35)
AST: 16 U/L (ref 0–37)
Alkaline Phosphatase: 120 U/L — ABNORMAL HIGH (ref 39–117)
CO2: 23 mEq/L (ref 19–32)
Calcium: 10 mg/dL (ref 8.4–10.5)
GFR calc Af Amer: >90 mL/min (ref >90)
Glucose, Bld: 191 mg/dL — ABNORMAL HIGH (ref 70–99)
Potassium: 4.4 mEq/L (ref 3.5–5.1)
Sodium: 136 mEq/L (ref 135–145)
Total Protein: 8.1 g/dL (ref 6.0–8.3)

## 2012-06-17 LAB — URINALYSIS, ROUTINE W REFLEX MICROSCOPIC
Glucose, UA: NEGATIVE mg/dL
Specific Gravity, Urine: 1.029 (ref 1.005–1.030)
pH: 6.5 (ref 5.0–8.0)

## 2012-06-17 SURGERY — LAPAROTOMY, EXPLORATORY
Anesthesia: General | Site: Abdomen | Wound class: Dirty or Infected

## 2012-06-17 MED ORDER — ONDANSETRON HCL 4 MG/2ML IJ SOLN
INTRAMUSCULAR | Status: DC | PRN
  Administered 2012-06-17: 4 mg via INTRAVENOUS

## 2012-06-17 MED ORDER — IOHEXOL 300 MG/ML  SOLN
100.0000 mL | Freq: Once | INTRAMUSCULAR | Status: AC | PRN
  Administered 2012-06-17: 80 mL via INTRAVENOUS

## 2012-06-17 MED ORDER — PROMETHAZINE HCL 25 MG/ML IJ SOLN
6.2500 mg | Freq: Four times a day (QID) | INTRAMUSCULAR | Status: DC | PRN
  Administered 2012-06-19: 6.25 mg via INTRAVENOUS
  Filled 2012-06-17: qty 1

## 2012-06-17 MED ORDER — MIDAZOLAM HCL 5 MG/5ML IJ SOLN
INTRAMUSCULAR | Status: DC | PRN
  Administered 2012-06-17: 2 mg via INTRAVENOUS

## 2012-06-17 MED ORDER — LIDOCAINE HCL (CARDIAC) 20 MG/ML IV SOLN
INTRAVENOUS | Status: DC | PRN
  Administered 2012-06-17: 100 mg via INTRAVENOUS

## 2012-06-17 MED ORDER — SUCCINYLCHOLINE CHLORIDE 20 MG/ML IJ SOLN
INTRAMUSCULAR | Status: DC | PRN
  Administered 2012-06-17: 100 mg via INTRAVENOUS

## 2012-06-17 MED ORDER — DIPHENHYDRAMINE HCL 50 MG/ML IJ SOLN: 12.5000 mg | Freq: Four times a day (QID) | INTRAMUSCULAR | Status: DC | PRN

## 2012-06-17 MED ORDER — ROCURONIUM BROMIDE 100 MG/10ML IV SOLN
INTRAVENOUS | Status: DC | PRN
  Administered 2012-06-17: 10 mg via INTRAVENOUS

## 2012-06-17 MED ORDER — NALOXONE HCL 0.4 MG/ML IJ SOLN: 0.4000 mg | INTRAMUSCULAR | Status: DC | PRN

## 2012-06-17 MED ORDER — ONDANSETRON HCL 4 MG/2ML IJ SOLN: 4.0000 mg | Freq: Four times a day (QID) | INTRAMUSCULAR | Status: DC | PRN

## 2012-06-17 MED ORDER — IOHEXOL 300 MG/ML  SOLN
25.0000 mL | INTRAMUSCULAR | Status: AC
  Administered 2012-06-17 (×2): 25 mL via ORAL

## 2012-06-17 MED ORDER — ASPIRIN 300 MG RE SUPP: 300.0000 mg | Freq: Once | RECTAL | Status: DC

## 2012-06-17 MED ORDER — 0.9 % SODIUM CHLORIDE (POUR BTL) OPTIME
TOPICAL | Status: DC | PRN
  Administered 2012-06-17 (×4): 1000 mL

## 2012-06-17 MED ORDER — SODIUM CHLORIDE 0.9 % IV BOLUS (SEPSIS)
1000.0000 mL | Freq: Once | INTRAVENOUS | Status: AC
  Administered 2012-06-17: 1000 mL via INTRAVENOUS

## 2012-06-17 MED ORDER — MORPHINE SULFATE (PF) 1 MG/ML IV SOLN
INTRAVENOUS | Status: DC
  Administered 2012-06-17: 23 mL via INTRAVENOUS
  Administered 2012-06-18: 3 mg via INTRAVENOUS
  Administered 2012-06-18: 16.5 mg via INTRAVENOUS

## 2012-06-17 MED ORDER — MORPHINE SULFATE 4 MG/ML IJ SOLN
4.0000 mg | INTRAMUSCULAR | Status: DC | PRN
  Administered 2012-06-17 – 2012-06-22 (×4): 4 mg via INTRAVENOUS
  Filled 2012-06-17 (×4): qty 1

## 2012-06-17 MED ORDER — LACTATED RINGERS IV SOLN
INTRAVENOUS | Status: DC | PRN
  Administered 2012-06-17 (×2): via INTRAVENOUS

## 2012-06-17 MED ORDER — SODIUM CHLORIDE 0.9 % IV SOLN: Freq: Once | INTRAVENOUS | Status: DC

## 2012-06-17 MED ORDER — NEOSTIGMINE METHYLSULFATE 1 MG/ML IJ SOLN
INTRAMUSCULAR | Status: DC | PRN
  Administered 2012-06-17: 3 mg via INTRAVENOUS

## 2012-06-17 MED ORDER — SODIUM CHLORIDE 0.9 % IV BOLUS (SEPSIS)
500.0000 mL | Freq: Once | INTRAVENOUS | Status: AC
  Administered 2012-06-17: 500 mL via INTRAVENOUS

## 2012-06-17 MED ORDER — SODIUM CHLORIDE 0.9 % IJ SOLN: 9.0000 mL | INTRAMUSCULAR | Status: DC | PRN

## 2012-06-17 MED ORDER — SODIUM CHLORIDE 0.9 % IV BOLUS (SEPSIS): 1000.0000 mL | Freq: Once | INTRAVENOUS | Status: DC

## 2012-06-17 MED ORDER — DEXTROSE 5 % IV SOLN
2.0000 g | INTRAVENOUS | Status: AC
  Administered 2012-06-17: 2 g via INTRAVENOUS
  Filled 2012-06-17: qty 2

## 2012-06-17 MED ORDER — DIPHENHYDRAMINE HCL 12.5 MG/5ML PO ELIX: 12.5000 mg | ORAL_SOLUTION | Freq: Four times a day (QID) | ORAL | Status: DC | PRN

## 2012-06-17 MED ORDER — PROPOFOL 10 MG/ML IV BOLUS
INTRAVENOUS | Status: DC | PRN
  Administered 2012-06-17: 100 mg via INTRAVENOUS

## 2012-06-17 MED ORDER — GLYCOPYRROLATE 0.2 MG/ML IJ SOLN
INTRAMUSCULAR | Status: DC | PRN
  Administered 2012-06-17: 0.4 mg via INTRAVENOUS

## 2012-06-17 MED ORDER — ONDANSETRON HCL 4 MG/2ML IJ SOLN
INTRAMUSCULAR | Status: AC
  Administered 2012-06-17: 4 mg via INTRAVENOUS
  Filled 2012-06-17: qty 2

## 2012-06-17 MED ORDER — MORPHINE SULFATE (PF) 1 MG/ML IV SOLN
INTRAVENOUS | Status: AC
  Filled 2012-06-17: qty 25

## 2012-06-17 MED ORDER — FENTANYL CITRATE 0.05 MG/ML IJ SOLN
INTRAMUSCULAR | Status: DC | PRN
  Administered 2012-06-17: 50 ug via INTRAVENOUS
  Administered 2012-06-17: 150 ug via INTRAVENOUS
  Administered 2012-06-17: 50 ug via INTRAVENOUS

## 2012-06-17 SURGICAL SUPPLY — 46 items
BLADE SURG ROTATE 9660 (MISCELLANEOUS) IMPLANT
CANISTER SUCTION 2500CC (MISCELLANEOUS) ×4 IMPLANT
CLOTH BEACON ORANGE TIMEOUT ST (SAFETY) ×2 IMPLANT
COVER SURGICAL LIGHT HANDLE (MISCELLANEOUS) ×2 IMPLANT
DRAPE LAPAROSCOPIC ABDOMINAL (DRAPES) ×2 IMPLANT
DRAPE UTILITY 15X26 W/TAPE STR (DRAPE) ×4 IMPLANT
DRAPE WARM FLUID 44X44 (DRAPE) ×2 IMPLANT
ELECT BLADE 6.5 EXT (BLADE) ×2 IMPLANT
ELECT CAUTERY BLADE 6.4 (BLADE) ×2 IMPLANT
ELECT REM PT RETURN 9FT ADLT (ELECTROSURGICAL) ×2
ELECTRODE REM PT RTRN 9FT ADLT (ELECTROSURGICAL) ×1 IMPLANT
GLOVE BIO SURGEON STRL SZ 6.5 (GLOVE) ×2 IMPLANT
GLOVE BIO SURGEON STRL SZ8 (GLOVE) ×2 IMPLANT
GLOVE BIOGEL PI IND STRL 6.5 (GLOVE) ×1 IMPLANT
GLOVE BIOGEL PI IND STRL 8 (GLOVE) ×1 IMPLANT
GLOVE BIOGEL PI INDICATOR 6.5 (GLOVE) ×1
GLOVE BIOGEL PI INDICATOR 8 (GLOVE) ×1
GLOVE SURG SIGNA 7.5 PF LTX (GLOVE) ×2 IMPLANT
GOWN PREVENTION PLUS XLARGE (GOWN DISPOSABLE) IMPLANT
GOWN STRL NON-REIN LRG LVL3 (GOWN DISPOSABLE) IMPLANT
KIT BASIN OR (CUSTOM PROCEDURE TRAY) ×2 IMPLANT
KIT ROOM TURNOVER OR (KITS) ×2 IMPLANT
LIGASURE IMPACT 36 18CM CVD LR (INSTRUMENTS) ×2 IMPLANT
NS IRRIG 1000ML POUR BTL (IV SOLUTION) ×8 IMPLANT
PACK GENERAL/GYN (CUSTOM PROCEDURE TRAY) ×2 IMPLANT
PAD ARMBOARD 7.5X6 YLW CONV (MISCELLANEOUS) ×2 IMPLANT
RELOAD PROXIMATE 75MM BLUE (ENDOMECHANICALS) ×6 IMPLANT
SPECIMEN JAR LARGE (MISCELLANEOUS) ×2 IMPLANT
SPONGE GAUZE 4X4 12PLY (GAUZE/BANDAGES/DRESSINGS) ×2 IMPLANT
SPONGE LAP 18X18 X RAY DECT (DISPOSABLE) ×2 IMPLANT
STAPLER GUN LINEAR PROX 60 (STAPLE) ×2 IMPLANT
STAPLER PROXIMATE 75MM BLUE (STAPLE) ×2 IMPLANT
STAPLER VISISTAT 35W (STAPLE) ×2 IMPLANT
SUCTION POOLE TIP (SUCTIONS) ×2 IMPLANT
SUT PDS AB 1 TP1 96 (SUTURE) ×4 IMPLANT
SUT SILK 2 0 SH CR/8 (SUTURE) ×2 IMPLANT
SUT SILK 2 0 TIES 10X30 (SUTURE) ×2 IMPLANT
SUT SILK 3 0 SH CR/8 (SUTURE) ×2 IMPLANT
SUT SILK 3 0 TIES 10X30 (SUTURE) IMPLANT
SUT VIC AB 3-0 SH 18 (SUTURE) IMPLANT
TAPE CLOTH SURG 6X10 WHT LF (GAUZE/BANDAGES/DRESSINGS) ×2 IMPLANT
TOWEL OR 17X24 6PK STRL BLUE (TOWEL DISPOSABLE) ×2 IMPLANT
TOWEL OR 17X26 10 PK STRL BLUE (TOWEL DISPOSABLE) ×2 IMPLANT
TRAY FOLEY CATH 14FRSI W/METER (CATHETERS) ×2 IMPLANT
WATER STERILE IRR 1000ML POUR (IV SOLUTION) IMPLANT
YANKAUER SUCT BULB TIP NO VENT (SUCTIONS) ×2 IMPLANT

## 2012-06-17 NOTE — ED Notes (Signed)
Pt was just released yesterday from hospital for abdominal abscess and is post surgical.  Pt wounds are to heal from inside out.  Pt had fish last nite and has been vomiting all nite.  Pt took tylenol PM before going to bed.  Pt is pale, tachycardic, and oral mucous membranes dry.  Pt is arousable.  No diabetes.

## 2012-06-17 NOTE — Anesthesia Postprocedure Evaluation (Signed)
Anesthesia Post Note  Patient: [name]  Procedure(s) Performed: Procedure(s) (LRB): EXPLORATORY LAPAROTOMY FOR  SMALL BOWEL RESECTION (N/A)  Anesthesia type: general  Patient location: PACU  Post pain: Pain level controlled  Post assessment: Patient's Cardiovascular Status Stable  Last Vitals:  Filed Vitals:   06/17/12 2145  BP: 152/68  Pulse: 98  Temp: 36.9 C  Resp: 14    Post vital signs: Reviewed and stable  Level of consciousness: sedated  Complications: No apparent anesthesia complications

## 2012-06-17 NOTE — Anesthesia Preprocedure Evaluation (Addendum)
Anesthesia Evaluation  Patient identified by MRN, date of birth, ID band Patient awake    Reviewed: Allergy & Precautions, H&P , NPO status , Patient's Chart, lab work & pertinent test results, reviewed documented beta blocker date and time   Airway Mallampati: I TM Distance: >3 FB Neck ROM: Full    Dental  (+) Teeth Intact and Dental Advisory Given   Pulmonary          Cardiovascular hypertension, Pt. on medications     Neuro/Psych  Headaches, Has MS in remission    GI/Hepatic   Endo/Other    Renal/GU      Musculoskeletal   Abdominal   Peds  Hematology   Anesthesia Other Findings   Reproductive/Obstetrics                          Anesthesia Physical Anesthesia Plan  ASA: III and emergent  Anesthesia Plan: General   Post-op Pain Management:    Induction: Intravenous  Airway Management Planned: Oral ETT  Additional Equipment:   Intra-op Plan:   Post-operative Plan: Extubation in OR  Informed Consent: I have reviewed the patients History and Physical, chart, labs and discussed the procedure including the risks, benefits and alternatives for the proposed anesthesia with the patient or authorized representative who has indicated his/her understanding and acceptance.   Dental advisory given  Plan Discussed with: Surgeon and CRNA  Anesthesia Plan Comments:        Anesthesia Quick Evaluation

## 2012-06-17 NOTE — ED Notes (Signed)
Patient transported to CT 

## 2012-06-17 NOTE — ED Notes (Signed)
CT notified pt finished drinking first glass of contrast & is starting on second

## 2012-06-17 NOTE — ED Notes (Signed)
Telephone report given. Pt transported to OR. Husband remains with pt

## 2012-06-17 NOTE — Progress Notes (Signed)
She had n/v with passage of flatus, still has some nausea and complains of some ab pain.  She is tachycardic.  She does not have peritoneal signs.  Her wounds look fine right now.  I think ct to look at abscesses and remainder of abdomen is best choice and will follow up after that.

## 2012-06-17 NOTE — Preoperative (Signed)
Beta Blockers   Reason not to administer Beta Blockers:Not Applicable. No home beta blockers 

## 2012-06-17 NOTE — ED Notes (Signed)
Drinking oral contrast for CT scan, encouraged to drink. Medicated for nausea prior to starting.  Mouth/oral care done

## 2012-06-17 NOTE — Transfer of Care (Signed)
Immediate Anesthesia Transfer of Care Note  Patient: [name]  Procedure(s) Performed: Procedure(s): EXPLORATORY LAPAROTOMY (N/A)  Patient Location: PACU  Anesthesia Type:General  Level of Consciousness: sedated  Airway & Oxygen Therapy: Patient Spontanous Breathing and Patient connected to nasal cannula oxygen  Post-op Assessment: Report given to PACU RN and Post -op Vital signs reviewed and stable  Post vital signs: Reviewed and stable  Complications: No apparent anesthesia complications

## 2012-06-17 NOTE — Progress Notes (Signed)
Patient ID: [name], female   DOB: 1955-09-16, 57 y.o.   MRN: [id number]    Subjective: This is a patient well known to our service for several recent admissions for abdominal pain with bilateral fluid collections likely secondary to her Beta Seron injections for her MS.  She was taken to the operating room over the weekend where she had these two fluid collections I&D.  Her fluid was clear, and her cultures had no growth.  The right sided culture had some E. Coli in the culture, but this is likely a contaminate as it otherwise had no organisms.  She did well from that and was discharged home yesterday on Augmentin.  She had no complaints at that time.  She went to [facility] last night and ate fish. Her husband also ate this and has had no issues.  After this, she started having profuse nausea and vomiting.  HH came to evaluate the patient this morning and felt she was dehydrated and should be evaluated.  She was brought to [facility] where she has been found to be tachycardic and has a WBC of 26.7K.  She does have a left shift with a neutrophil count of 93.  She denies any fevers.  We have been asked to see her for further evaluation given our recent involvement.  She is unable to tell me right now whether her pain is new or similar to prior pain.  Objective: Vital signs in last 24 hours: Temp:  [98.3 F (36.8 C)] 98.3 F (36.8 C) (05/21 0954) Pulse Rate:  [129-138] 129 (05/21 1305) Resp:  [16-22] 20 (05/21 1305) BP: (143-148)/(66-86) 146/86 mmHg (05/21 1305) SpO2:  [97 %-99 %] 97 % (05/21 1305)    Intake/Output from previous day:   Intake/Output this shift:    PE: Abd: soft, tender in the RLQ and diffusely around her upper abdominal incisions.  Packing is removed from both of her wounds.  These wounds are completely clean.  Fascia is visible on the right side and is white, shiny, and healthy.  The left-sided wound is clean with minimal fibrin near the base.  She is nondistended and has a  few BS.  No peritoneal signs, guarding, or rebounding. Heart: tachycardic Lungs: CTAB   Lab Results:   Recent Labs  06/17/12 0959  WBC 26.7*  HGB 12.3  HCT 35.4*  PLT 689*   BMET  Recent Labs  06/17/12 0959  NA 136  K 4.4  CL 97  CO2 23  GLUCOSE 191*  BUN 23  CREATININE 0.72  CALCIUM 10.0   PT/INR No results found for this basename: LABPROT, INR,  in the last 72 hours CMP     Component Value Date/Time   NA 136 06/17/2012 0959   K 4.4 06/17/2012 0959   CL 97 06/17/2012 0959   CO2 23 06/17/2012 0959   GLUCOSE 191* 06/17/2012 0959   BUN 23 06/17/2012 0959   CREATININE 0.72 06/17/2012 0959   CALCIUM 10.0 06/17/2012 0959   PROT 8.1 06/17/2012 0959   ALBUMIN 3.0* 06/17/2012 0959   AST 16 06/17/2012 0959   ALT 9 06/17/2012 0959   ALKPHOS 120* 06/17/2012 0959   BILITOT 0.3 06/17/2012 0959   GFRNONAA >90 06/17/2012 0959   GFRAA >90 06/17/2012 0959   Lipase     Component Value Date/Time   LIPASE 14 06/17/2012 0959       Studies/Results: Dg Abd Acute W/chest  06/17/2012   *RADIOLOGY REPORT*  Clinical Data: Vomiting.  Abdominal pain.  ACUTE ABDOMEN SERIES (ABDOMEN 2 VIEW & CHEST 1 VIEW)  Comparison: [facility] images for CT scan dated 06/12/2012 and chest x- ray dated 09/07/2010  Findings: The heart and lungs appear normal.  There are multiple slightly distended loops of small bowel in the mid abdomen, new since the prior CT scan.  Contrast from the prior CT scan is in the nondistended colon.  No free air.  Increased density in the pelvis could represent ascites or a distended urinary bladder.  IMPRESSION: New small bowel distention suggesting obstruction.   Original Report Authenticated By: Francene Boyers, M.D.    Anti-infectives: Anti-infectives   None       Assessment/Plan  1. Leukocytosis 2. Abdominal pain 3. S/p I&D of bilateral abdominal wall fluid collections 4. ? UTI 5. MS  Plan: 1. I have evaluated the patient.  Her urine is dark and has some leukocytes and  bacteria in it.  However, given her WBC, left shift, and tachycardia, I think it is reasonable to obtain a CT scan to rule out any other intra-abdominal process.  I think the findings on her abdominal film are likely a reactive ileus from some other process.  We do not have enough information right now to say that her illness is secondary to her prior surgical problem or a new surgical problem.  We will follow along and see what her CT scan shows.  If these findings are all secondary to UTI or a medical problem we would recommend a medical admission and we will follow the patient while she is here.    LOS: 0 days    Mozell Hardacre E 06/17/2012, 1:44 PM Pager: [phone]

## 2012-06-17 NOTE — ED Notes (Signed)
Did in and out cath on patient dark yellow urine in return 

## 2012-06-17 NOTE — Op Note (Signed)
EXPLORATORY LAPAROTOMY  Procedure Note  CINDE EBERT 06/17/2012   Pre-op Diagnosis: Small Bowel Obstruction     Post-op Diagnosis: same  Procedure(s): EXPLORATORY LAPAROTOMY SMALL BOWEL RESECTION  Surgeon(s): Shelly Rubenstein, MD Valarie Merino, MD  Anesthesia: General  Staff:  Circulator: Jolinda Croak, RN Scrub Person: Dorothea Glassman, CST OR Clinical Technician: Cecile Hearing; Waymond L Tuck  Estimated Blood Loss: Minimal               Specimens: SENT TO PATH          Abigail Miyamoto A   Date: 06/17/2012  Time: 8:40 PM

## 2012-06-17 NOTE — ED Notes (Signed)
Pt discharged yesterday after having abd wound debrided. To have drsg changed daily at home. Spouse reports upon leaving hospital ate fried fish & french fries for dinner then n/v started at 2200. Pt was on regular diet prior to discharge, husband states pt would only eat a few bites off tray but last night ate the most food "in a long time".  Drsg to abd dry & intact. Denies any new abd pain, pain due to surgery.  Took zofran 0800 & has not vomited since but continues with nausea

## 2012-06-17 NOTE — ED Notes (Signed)
General Surgeon, Dr. Magnus Ivan at bedside.

## 2012-06-17 NOTE — ED Notes (Signed)
Placed foley cath into patient size 14 french

## 2012-06-17 NOTE — ED Provider Notes (Signed)
CT has come back positive for small bowel obstruction. General surgery has seen the patient and will take her to the operating room.  Results for orders placed during the hospital encounter of 06/17/12  CBC WITH DIFFERENTIAL      Result Value Range   WBC 26.7 (*) 4.0 - 10.5 K/uL   RBC 3.87  3.87 - 5.11 MIL/uL   Hemoglobin 12.3  12.0 - 15.0 g/dL   HCT 98.1 (*) 19.1 - 47.8 %   MCV 91.5  78.0 - 100.0 fL   MCH 31.8  26.0 - 34.0 pg   MCHC 34.7  30.0 - 36.0 g/dL   RDW 29.5  62.1 - 30.8 %   Platelets 689 (*) 150 - 400 K/uL   Neutrophils Relative % 93 (*) 43 - 77 %   Lymphocytes Relative 4 (*) 12 - 46 %   Monocytes Relative 3  3 - 12 %   Eosinophils Relative 0  0 - 5 %   Basophils Relative 0  0 - 1 %   Neutro Abs 24.8 (*) 1.7 - 7.7 K/uL   Lymphs Abs 1.1  0.7 - 4.0 K/uL   Monocytes Absolute 0.8  0.1 - 1.0 K/uL   Eosinophils Absolute 0.0  0.0 - 0.7 K/uL   Basophils Absolute 0.0  0.0 - 0.1 K/uL   RBC Morphology POLYCHROMASIA PRESENT    COMPREHENSIVE METABOLIC PANEL      Result Value Range   Sodium 136  135 - 145 mEq/L   Potassium 4.4  3.5 - 5.1 mEq/L   Chloride 97  96 - 112 mEq/L   CO2 23  19 - 32 mEq/L   Glucose, Bld 191 (*) 70 - 99 mg/dL   BUN 23  6 - 23 mg/dL   Creatinine, Ser 6.57  0.50 - 1.10 mg/dL   Calcium 84.6  8.4 - 96.2 mg/dL   Total Protein 8.1  6.0 - 8.3 g/dL   Albumin 3.0 (*) 3.5 - 5.2 g/dL   AST 16  0 - 37 U/L   ALT 9  0 - 35 U/L   Alkaline Phosphatase 120 (*) 39 - 117 U/L   Total Bilirubin 0.3  0.3 - 1.2 mg/dL   GFR calc non Af Amer >90  >90 mL/min   GFR calc Af Amer >90  >90 mL/min  LIPASE, BLOOD      Result Value Range   Lipase 14  11 - 59 U/L  URINALYSIS, ROUTINE W REFLEX MICROSCOPIC      Result Value Range   Color, Urine YELLOW  YELLOW   APPearance CLEAR  CLEAR   Specific Gravity, Urine 1.029  1.005 - 1.030   pH 6.5  5.0 - 8.0   Glucose, UA NEGATIVE  NEGATIVE mg/dL   Hgb urine dipstick SMALL (*) NEGATIVE   Bilirubin Urine SMALL (*) NEGATIVE   Ketones, ur  15 (*) NEGATIVE mg/dL   Protein, ur 952 (*) NEGATIVE mg/dL   Urobilinogen, UA 0.2  0.0 - 1.0 mg/dL   Nitrite NEGATIVE  NEGATIVE   Leukocytes, UA TRACE (*) NEGATIVE  URINE MICROSCOPIC-ADD ON      Result Value Range   Squamous Epithelial / LPF RARE  RARE   WBC, UA 11-20  <3 WBC/hpf   RBC / HPF 3-6  <3 RBC/hpf   Bacteria, UA FEW (*) RARE  CG4 I-STAT (LACTIC ACID)      Result Value Range   Lactic Acid, Venous 2.18  0.5 - 2.2 mmol/L  POCT I-STAT  3, BLOOD GAS (G3P V)      Result Value Range   pH, Ven 7.445 (*) 7.250 - 7.300   pCO2, Ven 38.4 (*) 45.0 - 50.0 mmHg   pO2, Ven 25.0 (*) 30.0 - 45.0 mmHg   Bicarbonate 26.4 (*) 20.0 - 24.0 mEq/L   TCO2 28  0 - 100 mmol/L   O2 Saturation 48.0     Acid-Base Excess 2.0  0.0 - 2.0 mmol/L   Patient temperature 98.3 F     Collection site RADIAL, ALLEN'S TEST ACCEPTABLE     Drawn by RT     Sample type VENOUS     Comment NOTIFIED PHYSICIAN     Ct Abdomen Pelvis W Contrast  06/17/2012   *RADIOLOGY REPORT*  Clinical Data: Abdominal pain, nausea and vomiting.  CT ABDOMEN AND PELVIS WITH CONTRAST  Technique:  Multidetector CT imaging of the abdomen and pelvis was performed following the standard protocol during bolus administration of intravenous contrast.  Contrast: 80mL OMNIPAQUE IOHEXOL 300 MG/ML  SOLN  Comparison: CT of the abdomen and pelvis 06/12/2012.  Findings:  Lung Bases: A small amount of anterior pericardial fluid and/or thickening, new compared to prior study, but unlikely to be of hemodynamic significance at this time.  Otherwise, unremarkable.  Abdomen/Pelvis:  The appearance of the liver, gallbladder, pancreas, spleen and bilateral kidneys is unremarkable.  There is some mild adreniform thickening of the adrenal glands bilaterally which is unchanged.  Normal appendix.  Compared to the prior examination there are now numerous borderline to mildly dilated loops of small bowel measuring up to approximately 3.1 cm in diameter.  The small bowel in the  low pelvis has a swirling pattern of the mesentery, and there is an apparent transition point best demonstrated on images 63 - 69 of series 2 where there is a transition from dilated loops of bowel to nondilated ileum, concerning for a small bowel obstruction.  These loops of bowel appear inflamed.  The remainder of the distal ileum is completely decompressed.  Although there is oral contrast within the proximal colon, this is likely residual from the prior study. There is a small amount of ascites throughout the peritoneal cavity.  No frank pneumoperitoneum to suggest perforation of the bowel at this time.  A Foley balloon catheters present within the lumen of the urinary bladder which is completely decompressed.  Status post hysterectomy.  Ovaries are not confidently identified and may be surgically absent or atrophic.  Musculoskeletal: Compared to the prior examination there has been interval incision and drainage of the two previously noted anterior abdominal wall fluid collections.  These have been left open with packing material.  No residual fluid collections are noted at this time, although there is some soft tissue thickening and enhancement of the soft tissues adjacent to the resection cavity on the upper left anterior abdominal wall.There are no aggressive appearing lytic or blastic lesions noted in the visualized portions of the skeleton.  IMPRESSION:  1.  Status post incision and drainage of the anterior abdominal wall fluid collections which are now packed and left open.  No significant residual fluid within the anterior abdominal wall at this time. 2.  Findings, as above, concerning for small-bowel obstruction with transition point in the low anatomic pelvis, likely within the proximal ileum, likely from adhesions.  Given the surrounding inflammatory changes and fluid, early bowel ischemia may be developing.  Surgical evaluation is strongly recommended. 3.  Interval development of a trace amount of  anterior pericardial  fluid and/or thickening, unlikely to be of any hemodynamic significance at this time. 4.  Other additional incidental findings, as above.  These results will be called to the ordering clinician or representative by the Radiologist Assistant, and communication documented in the PACS Dashboard.   Original Report Authenticated By: Trudie Reed, M.D.   Ct Abdomen Pelvis W Contrast  06/12/2012   *RADIOLOGY REPORT*  Clinical Data: Right upper quadrant pain, history of multiple sclerosis, prior fluid collections subcutaneously secondary to subcutaneous injections.  Evaluate for abscess  CT ABDOMEN AND PELVIS WITH CONTRAST  Technique:  Multidetector CT imaging of the abdomen and pelvis was performed following the standard protocol during bolus administration of intravenous contrast.  Contrast: OMNIPAQUE IOHEXOL 300 MG/ML  SOLN  Comparison: CT abdomen pelvis of 06/01/2012  Findings: The lung bases are clear.  The liver enhances with no focal abnormality and no ductal dilatation is seen.  No calcified gallstones are noted.  The pancreas is normal in size and the pancreatic duct is not dilated.  The adrenal glands and spleen are stable.  The stomach is moderately fluid distended with no abnormality noted.  The kidneys enhance with no calculus or mass and no hydronephrosis is seen.  The abdominal aorta is normal in caliber.  No adenopathy is noted.  The previously noted fluid collection within the subcutaneous tissues of the left anterior abdomen wall anterior to the abdominal wall musculature is again noted and appears somewhat thick-walled. This collection measures 4.9 x 2.2 x 6.5 cm.  There is however a new collection of fluid located in the right anterior abdominal subcutaneous tissues abutting the anterior abdominal wall musculature measuring 6.5 x 2.0 by 13.7 cm.  These most likely represent abscesses, being thick-walled and somewhat irregular in outline.  Neither of these collections  appears to communicate with the peritoneal cavity, and neither of these collections contains air.  The urinary bladder is decompressed.  The uterus has previously been resected.  No adnexal lesion is seen.  No fluid is noted within the pelvis.  There is feces throughout the colon.  No bony abnormality is seen.  There may be minimal compression deformity of T12 vertebral body which appears to have been present on CT from February 2012.  IMPRESSION:  1.  Little change in anterior abdominal wall subcutaneous fluid collection in the left abdomen as noted above. 2.  New fluid collection within the right anterior abdominal wall abutting the abdominal wall musculature.  No communication with the peritoneal cavity is seen.  These superficial anterior abdominal wall fluid collections most likely represent abscesses.   Original Report Authenticated By: Dwyane Dee, M.D.   Ct Abdomen Pelvis W Contrast  06/01/2012   *RADIOLOGY REPORT*  Clinical Data: abdominal pain and fever for 2 days.  Recent drainage on the left side.  History of prior abscess.  CT ABDOMEN AND PELVIS WITH CONTRAST  Technique:  Multidetector CT imaging of the abdomen and pelvis was performed following the standard protocol during bolus administration of intravenous contrast.  Contrast: 80mL OMNIPAQUE IOHEXOL 300 MG/ML  SOLN  Comparison: None.  Findings: Phlegmon is present in the right anterior abdominal wall soft tissues, which appears similar to the prior exam.  There is recurrent fluid collection in the left anterior abdominal wall that measures 5.6 cm transverse by 3.1 cm AP.  This is not the cavity that was previously drained. Craniocaudal extent is 7.3 cm.  The appearance is compatible with recurrent abscess.  There is no violation of the  peritoneum or intra-abdominal extension.  Solid and hollow abdominal viscera appears similar compared to the recent prior examination without an acute intra- abdominal abnormality.  The bladder is decompressed with  chronic mural thickening and mucosal enhancement.  Compression fractures again noted.  IMPRESSION: Recurrent left anterior abdominal wall abscess measuring 5.6 x 3.1 x 7.3 cm.   Original Report Authenticated By: Andreas Newport, M.D.   Dg Abd Acute W/chest  06/17/2012   *RADIOLOGY REPORT*  Clinical Data: Vomiting.  Abdominal pain.  ACUTE ABDOMEN SERIES (ABDOMEN 2 VIEW & CHEST 1 VIEW)  Comparison: [facility] images for CT scan dated 06/12/2012 and chest x- ray dated 09/07/2010  Findings: The heart and lungs appear normal.  There are multiple slightly distended loops of small bowel in the mid abdomen, new since the prior CT scan.  Contrast from the prior CT scan is in the nondistended colon.  No free air.  Increased density in the pelvis could represent ascites or a distended urinary bladder.  IMPRESSION: New small bowel distention suggesting obstruction.   Original Report Authenticated By: Francene Boyers, M.D.   Images viewed by me.   Dione Booze, MD 06/17/12 [phone]

## 2012-06-17 NOTE — ED Provider Notes (Signed)
History     CSN: [id number]  Arrival date & time 06/17/12  8295   First MD Initiated Contact with Patient 06/17/12 1023      Chief Complaint  Patient presents with  . Emesis    (Consider location/radiation/quality/duration/timing/severity/associated sxs/prior treatment) HPI Comments: Pt with hx of HTN, MS, recent abdominal wall abscess, s/p drainage with packing and discharge yday. Pt reports that she went home last night, had fish for dinner, and soon after started having nausea, emesis - about 20+ episodes of emesis thus far - mostly food. No diarrhea. She has mild abd pain, epigastrium, around the site of the abscess - but it is at baseline. No n/v/f/c. No chest pain, dib. Pt's last BM was on Monday, she thinks she is passing flatus OK.  Patient is a 57 y.o. female presenting with vomiting. The history is provided by the patient and medical records.  Emesis Associated symptoms: abdominal pain   Associated symptoms: no diarrhea     Past Medical History  Diagnosis Date  . Hypertension   . Multiple sclerosis 1972  . Self-catheterizes urinary bladder ~ 2012    "sphincter in bladder doesn't work properly due to my MS" (05/17/2012  . History of recurrent UTIs   . History of blood transfusion ~ 2012    "after multiple UTI's; got bladder infection; passed alot of blood" (05/05/2012)  . Headache     "several/wk" (05/17/2012)  . 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 Abdominal fluid collection 05/07/2012  . Abdominal wall fluid collections 06/15/2012  . Abdominal pain, RUQ (right upper quadrant) 06/15/2012    Past Surgical History  Procedure Laterality Date  . Vaginal hysterectomy  2011  . Wound debridement Bilateral 06/13/2012    Procedure: DEBRIDEMENT ABDOMINAL WOUND;  Surgeon: Almond Lint, MD;  Location: [facility] OR;  Service: General;  Laterality: Bilateral;    Family History  Problem Relation Age of Onset  . Heart disease Mother   . Congestive Heart Failure Mother   . Heart disease Father    . Stroke Father   . Heart disease Brother   . Heart attack Brother     History  Substance Use Topics  . Smoking status: Never Smoker   . Smokeless tobacco: Never Used  . Alcohol Use: No    OB History   Grav Para Term Preterm Abortions TAB SAB Ect Mult Living                  Review of Systems  Constitutional: Negative for activity change.  HENT: Negative for facial swelling and neck pain.   Respiratory: Negative for cough, shortness of breath and wheezing.   Cardiovascular: Negative for chest pain.  Gastrointestinal: Positive for nausea, vomiting and abdominal pain. Negative for diarrhea, constipation, blood in stool and abdominal distention.  Genitourinary: Negative for hematuria and difficulty urinating.  Skin: Negative for color change.  Neurological: Positive for dizziness. Negative for speech difficulty.  Hematological: Does not bruise/bleed easily.  Psychiatric/Behavioral: Negative for confusion.    Allergies  Oxycodone  Home Medications   Current Outpatient Rx  Name  Route  Sig  Dispense  Refill  . amoxicillin-clavulanate (AUGMENTIN) 875-125 MG per tablet   Oral   Take 1 tablet by mouth every 12 (twelve) hours.   14 tablet   0   . aspirin-acetaminophen-caffeine (EXCEDRIN MIGRAINE) 250-250-65 MG per tablet   Oral   Take 2 tablets by mouth every 6 (six) hours as needed for pain.         Marland Kitchen  Calcium Carbonate-Vitamin D (CALCIUM 600 + D PO)   Oral   Take 1 tablet by mouth daily.         . ferrous sulfate 325 (65 FE) MG tablet   Oral   Take 325 mg by mouth daily with breakfast.         . lisinopril (PRINIVIL,ZESTRIL) 10 MG tablet   Oral   Take 10 mg by mouth daily.         . Misc Natural Products (OSTEO BI-FLEX ADV JOINT SHIELD PO)   Oral   Take 1 tablet by mouth daily.         . Multiple Vitamin (MULTIVITAMIN) tablet   Oral   Take 1 tablet by mouth daily.         . ondansetron (ZOFRAN) 4 MG tablet   Oral   Take 4 mg by mouth every 8  (eight) hours as needed for nausea.         Marland Kitchen oxybutynin (DITROPAN) 5 MG tablet   Oral   Take 5 mg by mouth 2 (two) times daily.           BP 148/81  Pulse 129  Temp(Src) 98.3 F (36.8 C) (Oral)  Resp 22  SpO2 99%  Physical Exam  Nursing note and vitals reviewed. Constitutional: She is oriented to person, place, and time. She appears well-developed and well-nourished.  HENT:  Head: Normocephalic and atraumatic.  Eyes: EOM are normal. Pupils are equal, round, and reactive to light.  Neck: Neck supple.  Cardiovascular: Normal rate, regular rhythm and normal heart sounds.   No murmur heard. Pulmonary/Chest: Effort normal. No respiratory distress.  Abdominal: Soft. She exhibits no distension. There is tenderness. There is no rebound and no guarding.  There are 2 large surgical open wounds with packing. There is mild surrounding erythema, but no fluctuance, crepitus and no purulent drainage appreciated.  Neurological: She is alert and oriented to person, place, and time.  Skin: Skin is warm and dry.    ED Course  Procedures (including critical care time)  Labs Reviewed  CBC WITH DIFFERENTIAL - Abnormal; Notable for the following:    WBC 26.7 (*)    HCT 35.4 (*)    Platelets 689 (*)    Neutrophils Relative % 93 (*)    Lymphocytes Relative 4 (*)    Neutro Abs 24.8 (*)    All other components within normal limits  COMPREHENSIVE METABOLIC PANEL - Abnormal; Notable for the following:    Glucose, Bld 191 (*)    Albumin 3.0 (*)    Alkaline Phosphatase 120 (*)    All other components within normal limits  POCT I-STAT 3, BLOOD GAS (G3P V) - Abnormal; Notable for the following:    pH, Ven 7.445 (*)    pCO2, Ven 38.4 (*)    pO2, Ven 25.0 (*)    Bicarbonate 26.4 (*)    All other components within normal limits  URINE CULTURE  CULTURE, BLOOD (ROUTINE X 2)  CULTURE, BLOOD (ROUTINE X 2)  LIPASE, BLOOD  URINALYSIS, ROUTINE W REFLEX MICROSCOPIC  CG4 I-STAT (LACTIC ACID)    Dg Abd Acute W/chest  06/17/2012   *RADIOLOGY REPORT*  Clinical Data: Vomiting.  Abdominal pain.  ACUTE ABDOMEN SERIES (ABDOMEN 2 VIEW & CHEST 1 VIEW)  Comparison: [facility] images for CT scan dated 06/12/2012 and chest x- ray dated 09/07/2010  Findings: The heart and lungs appear normal.  There are multiple slightly distended loops of small bowel in the  mid abdomen, new since the prior CT scan.  Contrast from the prior CT scan is in the nondistended colon.  No free air.  Increased density in the pelvis could represent ascites or a distended urinary bladder.  IMPRESSION: New small bowel distention suggesting obstruction.   Original Report Authenticated By: Francene Boyers, M.D.     No diagnosis found.    MDM  Pt comes in with cc of abd pain. She has MS, and although not currently taking her IM shots -she is immunocompromised. She had a recent abdominal wall abscess related admission, and was just discharged yday. The wound site looks clear at this time. Nausea and emesis could be related to bacteremia, could be related to SBO, could be related to gastroenteritis.  She has significant leukocytosis - so we will get infection screen and sepsis workup (3 sirs criteria). Based on hx and exam, cant think of any source of infection. UA is pending.  1:09 PM AAS - there is possible SBO. Lactate is neg. Gen Surg to see patient and give [place] further recs.  Derwood Kaplan, MD 06/17/12 1310

## 2012-06-17 NOTE — Progress Notes (Signed)
Patient ID: [name], female   DOB: February 13, 1955, 57 y.o.   MRN: [id number]  CT scan shows SBO with dilated bowel, surrounding edema and inflammatory changes and free fluid.  On exam, she is tender with guarding.  Given this, she need emergent exploratory laparotomy.  I discussed the risks with the patient and her family including, but not limited to bleeding, infection, injury to surrounding structures, need for bowel resection, etc.  They agree to proceed.

## 2012-06-17 NOTE — ED Notes (Signed)
Patient transported to X-ray 

## 2012-06-18 ENCOUNTER — Encounter (INDEPENDENT_AMBULATORY_CARE_PROVIDER_SITE_OTHER): Payer: Medicare Other | Admitting: Surgery

## 2012-06-18 ENCOUNTER — Encounter (HOSPITAL_COMMUNITY): Payer: Self-pay | Admitting: General Practice

## 2012-06-18 LAB — ANAEROBIC CULTURE: Gram Stain: NONE SEEN

## 2012-06-18 LAB — URINE CULTURE
Colony Count: NO GROWTH
Culture: NO GROWTH

## 2012-06-18 LAB — CBC
HCT: 26.9 % — ABNORMAL LOW (ref 36.0–46.0)
Hemoglobin: 8.9 g/dL — ABNORMAL LOW (ref 12.0–15.0)
MCH: 30.8 pg (ref 26.0–34.0)
MCV: 93.1 fL (ref 78.0–100.0)
RBC: 2.89 MIL/uL — ABNORMAL LOW (ref 3.87–5.11)

## 2012-06-18 LAB — BASIC METABOLIC PANEL
BUN: 16 mg/dL (ref 6–23)
CO2: 21 mEq/L (ref 19–32)
Calcium: 8.4 mg/dL (ref 8.4–10.5)
Glucose, Bld: 110 mg/dL — ABNORMAL HIGH (ref 70–99)
Sodium: 138 mEq/L (ref 135–145)

## 2012-06-18 MED ORDER — DIPHENHYDRAMINE HCL 50 MG/ML IJ SOLN
12.5000 mg | Freq: Four times a day (QID) | INTRAMUSCULAR | Status: DC | PRN
  Filled 2012-06-18: qty 1

## 2012-06-18 MED ORDER — ENOXAPARIN SODIUM 40 MG/0.4ML ~~LOC~~ SOLN
40.0000 mg | SUBCUTANEOUS | Status: DC
  Administered 2012-06-18 – 2012-06-23 (×6): 40 mg via SUBCUTANEOUS
  Filled 2012-06-18 (×7): qty 0.4

## 2012-06-18 MED ORDER — MORPHINE SULFATE (PF) 1 MG/ML IV SOLN
INTRAVENOUS | Status: DC
  Administered 2012-06-18: 11:00:00 via INTRAVENOUS
  Administered 2012-06-18: 1.5 mg via INTRAVENOUS
  Administered 2012-06-18: 3 mg via INTRAVENOUS
  Administered 2012-06-19: 4.5 mg via INTRAVENOUS
  Administered 2012-06-19: 5.9 mg via INTRAVENOUS
  Administered 2012-06-19: 4.5 mg via INTRAVENOUS
  Administered 2012-06-19: 1.5 mg via INTRAVENOUS
  Administered 2012-06-19: 7.5 mg via INTRAVENOUS
  Administered 2012-06-19 (×2): via INTRAVENOUS
  Administered 2012-06-19: 7.5 mg via INTRAVENOUS
  Administered 2012-06-19: 14.61 mg via INTRAVENOUS
  Administered 2012-06-20: 12:00:00 via INTRAVENOUS
  Administered 2012-06-20: 6 mg via INTRAVENOUS
  Administered 2012-06-20: 11 mg via INTRAVENOUS
  Administered 2012-06-21: 5 mg via INTRAVENOUS
  Administered 2012-06-21: 7.33 mg via INTRAVENOUS
  Administered 2012-06-21: 04:00:00 via INTRAVENOUS
  Filled 2012-06-18 (×6): qty 25

## 2012-06-18 MED ORDER — SODIUM CHLORIDE 0.9 % IJ SOLN: 9.0000 mL | INTRAMUSCULAR | Status: DC | PRN

## 2012-06-18 MED ORDER — ONDANSETRON HCL 4 MG/2ML IJ SOLN
4.0000 mg | Freq: Four times a day (QID) | INTRAMUSCULAR | Status: DC | PRN
  Administered 2012-06-18 – 2012-06-21 (×5): 4 mg via INTRAVENOUS
  Filled 2012-06-18: qty 2

## 2012-06-18 MED ORDER — HYDROMORPHONE HCL PF 1 MG/ML IJ SOLN: 1.0000 mg | INTRAMUSCULAR | Status: DC | PRN

## 2012-06-18 MED ORDER — DIPHENHYDRAMINE HCL 12.5 MG/5ML PO ELIX: 12.5000 mg | ORAL_SOLUTION | Freq: Four times a day (QID) | ORAL | Status: DC | PRN

## 2012-06-18 MED ORDER — NALOXONE HCL 0.4 MG/ML IJ SOLN: 0.4000 mg | INTRAMUSCULAR | Status: DC | PRN

## 2012-06-18 MED ORDER — ONDANSETRON HCL 4 MG/2ML IJ SOLN
4.0000 mg | Freq: Four times a day (QID) | INTRAMUSCULAR | Status: DC | PRN
  Administered 2012-06-19 (×2): 4 mg via INTRAVENOUS
  Filled 2012-06-18 (×6): qty 2

## 2012-06-18 MED ORDER — POTASSIUM CHLORIDE IN NACL 20-0.9 MEQ/L-% IV SOLN
INTRAVENOUS | Status: DC
  Administered 2012-06-18 – 2012-06-24 (×15): via INTRAVENOUS
  Filled 2012-06-18 (×18): qty 1000

## 2012-06-18 MED ORDER — ONDANSETRON HCL 4 MG PO TABS: 4.0000 mg | ORAL_TABLET | Freq: Four times a day (QID) | ORAL | Status: DC | PRN

## 2012-06-18 NOTE — Op Note (Signed)
NAMEANSHIKA, PETHTEL NO.:  [id number]  MEDICAL RECORD NO.:  [id number]  LOCATION:  D30C                         FACILITY:  [facility]  PHYSICIAN:  Abigail Miyamoto, M.D. DATE OF BIRTH:  Oct 31, 1955  DATE OF PROCEDURE:  06/17/2012 DATE OF DISCHARGE:                              OPERATIVE REPORT   PREOPERATIVE DIAGNOSIS:  Small bowel obstruction.  POSTOPERATIVE DIAGNOSIS:  Small bowel obstruction.  PROCEDURES: 1. Exploratory laparotomy. 2. Small bowel resection.  SURGEON:  Abigail Miyamoto, M.D.  ASSISTANT:  Thornton Park. Daphine Deutscher, MD  ANESTHESIA:  General endotracheal anesthesia.  ESTIMATED BLOOD LOSS:  Minimal.  INDICATIONS:  This is a 57 year old female who presents with abdominal pain, nausea, and vomiting.  She has had a CAT scan of the abdomen and pelvis demonstrating a small bowel obstruction with inflammatory changes and free abdominal fluid.  She also has an elevated white blood count and abdominal tenderness.  Decision was made to proceed to the operating room for exploratory laparotomy.  FINDINGS:  The patient was found to have an adhesion causing a closed loop obstruction with ischemic small bowel.  Approximately one foot of small bowel had to be excised.  PROCEDURE IN DETAIL:  The patient was brought to the operating room, identified as [name].  She was placed supine on the operating room table, and general anesthesia was induced.  Her abdomen was then prepped and draped in usual sterile fashion.  I created a lower midline incision below the umbilicus with a scalpel.  I took this down through the fascia with electrocautery.  The peritoneum was then opened to the entire length of the incision.  Upon entering the abdomen, there was a moderate amount of free fluid.  There was small bowel stuck down the pelvis, which I was able to bring up out of the pelvis.  There was approximately foot and a half of ischemic small bowel from a single adhesive  band creating a closed loop obstruction.  Sigmoid colon was also draped to the other side of the pelvis creating another area where obstruction could happen and adhesive band was lysed there as well. Once the adhesion was released, the small bowel still appeared quite ischemic.  Decision was made to proceed with a small bowel resection.  I opened the mesentery adjacent to the small-bowel proximal and distal to the area of obstruction.  I then transected the bowel adhesions with a GIA 75 stapler.  I then took down the mesentery with the ligature.  The small bowel specimen was then sent to Pathology for evaluation.  I then reapproximated the remaining small bowel in a side-to-side fashion with silk sutures.  I created 2 enterotomies and then performed a side-to- side anastomosis with a single firing of the GIA 75 stapler.  I then closed the opening with a TA 60 stapler.  I then repaired the mesenteric defect with interrupted silk sutures.  A wide anastomosis appeared to be achieved.  I milked some of the small-bowel contents proximal to the anastomosis, saw no evidence of leak.  It appeared pink and quite viable.  At this point, I then thoroughly irrigated the abdomen with several liters of normal saline.  No other abnormalities were identified.  I closed the patient's midline fascia with running #1 looped PDS suture.  The skin was then irrigated and closed with skin staples.  The patient had 2 previous lateral wounds from abscess drainage sites, which were packed with gauze.  The patient tolerated the procedure well.  All the counts were correct at the end of procedure. The patient was then extubated in the operating room and taken in a stable condition to the recovery room.     Abigail Miyamoto, M.D.     DB/MEDQ  D:  06/17/2012  T:  06/18/2012  Job:  [id number]

## 2012-06-18 NOTE — Progress Notes (Signed)
Received pt. From PACU nurse Ashok Cordia.  Pt  Is on PCA Morphine.  She is very lethargic but wakes and response to voice command.  She c/o pain in the throat.  [name].

## 2012-06-18 NOTE — Progress Notes (Signed)
Patient ID: [name], female   DOB: 12-24-1955, 57 y.o.   MRN: [id number] 1 Day Post-Op  Subjective: Pt is sleeping.  Husband states each time she uses her PCA, she is knocked out.    Objective: Vital signs in last 24 hours: Temp:  [97.8 F (36.6 C)-99.4 F (37.4 C)] 97.8 F (36.6 C) (05/22 1035) Pulse Rate:  [79-137] 98 (05/22 1035) Resp:  [12-26] 20 (05/22 1040) BP: (118-165)/(64-89) 137/69 mmHg (05/22 1035) SpO2:  [95 %-100 %] 96 % (05/22 1040) Weight:  [125 lb (56.7 kg)] 125 lb (56.7 kg) (05/21 1836)    Intake/Output from previous day: 05/21 0701 - 05/22 0700 In: 3430 [I.V.:3400; NG/GT:30] Out: 1005 [Urine:395; Emesis/NG output:600; Blood:10] Intake/Output this shift:    PE: Abd: soft, incision c/d/i with staples.  Wounds are packed.  Tender, -BS, NGT in place with bilious output.  Lab Results:   Recent Labs  06/17/12 0959 06/18/12 0635  WBC 26.7* 19.5*  HGB 12.3 8.9*  HCT 35.4* 26.9*  PLT 689* 543*   BMET  Recent Labs  06/17/12 0959 06/18/12 0635  NA 136 138  K 4.4 4.3  CL 97 106  CO2 23 21  GLUCOSE 191* 110*  BUN 23 16  CREATININE 0.72 0.56  CALCIUM 10.0 8.4   PT/INR No results found for this basename: LABPROT, INR,  in the last 72 hours CMP     Component Value Date/Time   NA 138 06/18/2012 0635   K 4.3 06/18/2012 0635   CL 106 06/18/2012 0635   CO2 21 06/18/2012 0635   GLUCOSE 110* 06/18/2012 0635   BUN 16 06/18/2012 0635   CREATININE 0.56 06/18/2012 0635   CALCIUM 8.4 06/18/2012 0635   PROT 8.1 06/17/2012 0959   ALBUMIN 3.0* 06/17/2012 0959   AST 16 06/17/2012 0959   ALT 9 06/17/2012 0959   ALKPHOS 120* 06/17/2012 0959   BILITOT 0.3 06/17/2012 0959   GFRNONAA >90 06/18/2012 0635   GFRAA >90 06/18/2012 0635   Lipase     Component Value Date/Time   LIPASE 14 06/17/2012 0959       Studies/Results: Ct Abdomen Pelvis W Contrast  06/17/2012   *RADIOLOGY REPORT*  Clinical Data: Abdominal pain, nausea and vomiting.  CT ABDOMEN AND PELVIS WITH  CONTRAST  Technique:  Multidetector CT imaging of the abdomen and pelvis was performed following the standard protocol during bolus administration of intravenous contrast.  Contrast: 80mL OMNIPAQUE IOHEXOL 300 MG/ML  SOLN  Comparison: CT of the abdomen and pelvis 06/12/2012.  Findings:  Lung Bases: A small amount of anterior pericardial fluid and/or thickening, new compared to prior study, but unlikely to be of hemodynamic significance at this time.  Otherwise, unremarkable.  Abdomen/Pelvis:  The appearance of the liver, gallbladder, pancreas, spleen and bilateral kidneys is unremarkable.  There is some mild adreniform thickening of the adrenal glands bilaterally which is unchanged.  Normal appendix.  Compared to the prior examination there are now numerous borderline to mildly dilated loops of small bowel measuring up to approximately 3.1 cm in diameter.  The small bowel in the low pelvis has a swirling pattern of the mesentery, and there is an apparent transition point best demonstrated on images 63 - 69 of series 2 where there is a transition from dilated loops of bowel to nondilated ileum, concerning for a small bowel obstruction.  These loops of bowel appear inflamed.  The remainder of the distal ileum is completely decompressed.  Although there is oral contrast within  the proximal colon, this is likely residual from the prior study. There is a small amount of ascites throughout the peritoneal cavity.  No frank pneumoperitoneum to suggest perforation of the bowel at this time.  A Foley balloon catheters present within the lumen of the urinary bladder which is completely decompressed.  Status post hysterectomy.  Ovaries are not confidently identified and may be surgically absent or atrophic.  Musculoskeletal: Compared to the prior examination there has been interval incision and drainage of the two previously noted anterior abdominal wall fluid collections.  These have been left open with packing material.  No  residual fluid collections are noted at this time, although there is some soft tissue thickening and enhancement of the soft tissues adjacent to the resection cavity on the upper left anterior abdominal wall.There are no aggressive appearing lytic or blastic lesions noted in the visualized portions of the skeleton.  IMPRESSION:  1.  Status post incision and drainage of the anterior abdominal wall fluid collections which are now packed and left open.  No significant residual fluid within the anterior abdominal wall at this time. 2.  Findings, as above, concerning for small-bowel obstruction with transition point in the low anatomic pelvis, likely within the proximal ileum, likely from adhesions.  Given the surrounding inflammatory changes and fluid, early bowel ischemia may be developing.  Surgical evaluation is strongly recommended. 3.  Interval development of a trace amount of anterior pericardial fluid and/or thickening, unlikely to be of any hemodynamic significance at this time. 4.  Other additional incidental findings, as above.  These results will be called to the ordering clinician or representative by the Radiologist Assistant, and communication documented in the PACS Dashboard.   Original Report Authenticated By: Trudie Reed, M.D.   Dg Abd Acute W/chest  06/17/2012   *RADIOLOGY REPORT*  Clinical Data: Vomiting.  Abdominal pain.  ACUTE ABDOMEN SERIES (ABDOMEN 2 VIEW & CHEST 1 VIEW)  Comparison: [facility] images for CT scan dated 06/12/2012 and chest x- ray dated 09/07/2010  Findings: The heart and lungs appear normal.  There are multiple slightly distended loops of small bowel in the mid abdomen, new since the prior CT scan.  Contrast from the prior CT scan is in the nondistended colon.  No free air.  Increased density in the pelvis could represent ascites or a distended urinary bladder.  IMPRESSION: New small bowel distention suggesting obstruction.   Original Report Authenticated By: Francene Boyers, M.D.     Anti-infectives: Anti-infectives   Start     Dose/Rate Route Frequency Ordered Stop   06/18/12 0600  cefOXitin (MEFOXIN) 2 g in dextrose 5 % 50 mL IVPB     2 g 100 mL/hr over 30 Minutes Intravenous On call to O.R. 06/17/12 1806 06/17/12 1920       Assessment/Plan  1. S/p ex lap with SBR and LOA for SBO with ischemia  2. S/p I&D of bilateral abdominal wall fluid collections 3. MS 4. Post op ileus  Plan: 1. Will change PCA to reduced dose given sedation 2. Leave foley catheter for today 3. Needs to get up to a chair today. 4. Aggressive pulm toilet 5. Await bowel function   LOS: 1 day    Letricia Krinsky E 06/18/2012, 10:55 AM Pager: [phone]

## 2012-06-18 NOTE — Progress Notes (Signed)
[facility]  Patient Status: Active (receiving services up to time of hospitalization)  [facility] is providing the following services: RN  If patient discharges after hours, please call [phone].   [name] 06/18/2012, 5:49 PM

## 2012-06-18 NOTE — Progress Notes (Signed)
UR completed 

## 2012-06-19 LAB — BASIC METABOLIC PANEL
CO2: 24 mEq/L (ref 19–32)
Chloride: 106 mEq/L (ref 96–112)
Glucose, Bld: 87 mg/dL (ref 70–99)
Potassium: 4.1 mEq/L (ref 3.5–5.1)
Sodium: 139 mEq/L (ref 135–145)

## 2012-06-19 LAB — CBC
Hemoglobin: 8.5 g/dL — ABNORMAL LOW (ref 12.0–15.0)
Platelets: 514 10*3/uL — ABNORMAL HIGH (ref 150–400)
RBC: 2.76 MIL/uL — ABNORMAL LOW (ref 3.87–5.11)
WBC: 17.3 10*3/uL — ABNORMAL HIGH (ref 4.0–10.5)

## 2012-06-19 MED ORDER — CHLORHEXIDINE GLUCONATE 0.12 % MT SOLN
15.0000 mL | Freq: Two times a day (BID) | OROMUCOSAL | Status: DC
  Administered 2012-06-20 – 2012-06-24 (×8): 15 mL via OROMUCOSAL
  Filled 2012-06-19 (×8): qty 15

## 2012-06-19 MED ORDER — WHITE PETROLATUM GEL
Status: AC
  Administered 2012-06-19: 0.2
  Filled 2012-06-19: qty 5

## 2012-06-19 MED ORDER — BIOTENE DRY MOUTH MT LIQD
15.0000 mL | Freq: Two times a day (BID) | OROMUCOSAL | Status: DC
  Administered 2012-06-19 – 2012-06-23 (×9): 15 mL via OROMUCOSAL

## 2012-06-19 MED ORDER — CHLORHEXIDINE GLUCONATE 0.12 % MT SOLN
OROMUCOSAL | Status: AC
  Administered 2012-06-19: 15 mL
  Filled 2012-06-19: qty 15

## 2012-06-19 NOTE — Progress Notes (Signed)
Patient ID: [name], female   DOB: 1955-03-16, 57 y.o.   MRN: [id number] 2 Days Post-Op  Subjective: More awake today, walked in room and got up to chair, still a little sick on stomach but no vomiitng, denies flatus  Objective: Vital signs in last 24 hours: Temp:  [97.8 F (36.6 C)-99 F (37.2 C)] 98.4 F (36.9 C) (05/23 0942) Pulse Rate:  [98-123] 102 (05/23 0942) Resp:  [13-20] 18 (05/23 0942) BP: (137-153)/(68-94) 142/72 mmHg (05/23 0942) SpO2:  [95 %-100 %] 96 % (05/23 0942) Last BM Date: 06/15/12  Intake/Output from previous day: 05/22 0701 - 05/23 0700 In: 3847.1 [P.O.:240; I.V.:3427.1; NG/GT:180] Out: 1400 [Urine:650; Emesis/NG output:750] Intake/Output this shift: Total I/O In: -  Out: 150 [Urine:150]  PE: Abd: soft, incision c/d/i with staples.  Wounds are packed.  Tender, no bowel sounds, NGT in place with bilious output.  Lab Results:   Recent Labs  06/18/12 0635 06/19/12 0545  WBC 19.5* 17.3*  HGB 8.9* 8.5*  HCT 26.9* 26.5*  PLT 543* 514*   BMET  Recent Labs  06/18/12 0635 06/19/12 0545  NA 138 139  K 4.3 4.1  CL 106 106  CO2 21 24  GLUCOSE 110* 87  BUN 16 13  CREATININE 0.56 0.52  CALCIUM 8.4 8.7   PT/INR No results found for this basename: LABPROT, INR,  in the last 72 hours CMP     Component Value Date/Time   NA 139 06/19/2012 0545   K 4.1 06/19/2012 0545   CL 106 06/19/2012 0545   CO2 24 06/19/2012 0545   GLUCOSE 87 06/19/2012 0545   BUN 13 06/19/2012 0545   CREATININE 0.52 06/19/2012 0545   CALCIUM 8.7 06/19/2012 0545   PROT 8.1 06/17/2012 0959   ALBUMIN 3.0* 06/17/2012 0959   AST 16 06/17/2012 0959   ALT 9 06/17/2012 0959   ALKPHOS 120* 06/17/2012 0959   BILITOT 0.3 06/17/2012 0959   GFRNONAA >90 06/19/2012 0545   GFRAA >90 06/19/2012 0545   Lipase     Component Value Date/Time   LIPASE 14 06/17/2012 0959       Studies/Results: Ct Abdomen Pelvis W Contrast  06/17/2012   *RADIOLOGY REPORT*  Clinical Data: Abdominal pain,  nausea and vomiting.  CT ABDOMEN AND PELVIS WITH CONTRAST  Technique:  Multidetector CT imaging of the abdomen and pelvis was performed following the standard protocol during bolus administration of intravenous contrast.  Contrast: 80mL OMNIPAQUE IOHEXOL 300 MG/ML  SOLN  Comparison: CT of the abdomen and pelvis 06/12/2012.  Findings:  Lung Bases: A small amount of anterior pericardial fluid and/or thickening, new compared to prior study, but unlikely to be of hemodynamic significance at this time.  Otherwise, unremarkable.  Abdomen/Pelvis:  The appearance of the liver, gallbladder, pancreas, spleen and bilateral kidneys is unremarkable.  There is some mild adreniform thickening of the adrenal glands bilaterally which is unchanged.  Normal appendix.  Compared to the prior examination there are now numerous borderline to mildly dilated loops of small bowel measuring up to approximately 3.1 cm in diameter.  The small bowel in the low pelvis has a swirling pattern of the mesentery, and there is an apparent transition point best demonstrated on images 63 - 69 of series 2 where there is a transition from dilated loops of bowel to nondilated ileum, concerning for a small bowel obstruction.  These loops of bowel appear inflamed.  The remainder of the distal ileum is completely decompressed.  Although there is oral  contrast within the proximal colon, this is likely residual from the prior study. There is a small amount of ascites throughout the peritoneal cavity.  No frank pneumoperitoneum to suggest perforation of the bowel at this time.  A Foley balloon catheters present within the lumen of the urinary bladder which is completely decompressed.  Status post hysterectomy.  Ovaries are not confidently identified and may be surgically absent or atrophic.  Musculoskeletal: Compared to the prior examination there has been interval incision and drainage of the two previously noted anterior abdominal wall fluid collections.  These  have been left open with packing material.  No residual fluid collections are noted at this time, although there is some soft tissue thickening and enhancement of the soft tissues adjacent to the resection cavity on the upper left anterior abdominal wall.There are no aggressive appearing lytic or blastic lesions noted in the visualized portions of the skeleton.  IMPRESSION:  1.  Status post incision and drainage of the anterior abdominal wall fluid collections which are now packed and left open.  No significant residual fluid within the anterior abdominal wall at this time. 2.  Findings, as above, concerning for small-bowel obstruction with transition point in the low anatomic pelvis, likely within the proximal ileum, likely from adhesions.  Given the surrounding inflammatory changes and fluid, early bowel ischemia may be developing.  Surgical evaluation is strongly recommended. 3.  Interval development of a trace amount of anterior pericardial fluid and/or thickening, unlikely to be of any hemodynamic significance at this time. 4.  Other additional incidental findings, as above.  These results will be called to the ordering clinician or representative by the Radiologist Assistant, and communication documented in the PACS Dashboard.   Original Report Authenticated By: Trudie Reed, M.D.   Dg Abd Acute W/chest  06/17/2012   *RADIOLOGY REPORT*  Clinical Data: Vomiting.  Abdominal pain.  ACUTE ABDOMEN SERIES (ABDOMEN 2 VIEW & CHEST 1 VIEW)  Comparison: [facility] images for CT scan dated 06/12/2012 and chest x- ray dated 09/07/2010  Findings: The heart and lungs appear normal.  There are multiple slightly distended loops of small bowel in the mid abdomen, new since the prior CT scan.  Contrast from the prior CT scan is in the nondistended colon.  No free air.  Increased density in the pelvis could represent ascites or a distended urinary bladder.  IMPRESSION: New small bowel distention suggesting obstruction.    Original Report Authenticated By: Francene Boyers, M.D.    Anti-infectives: Anti-infectives   Start     Dose/Rate Route Frequency Ordered Stop   06/18/12 0600  cefOXitin (MEFOXIN) 2 g in dextrose 5 % 50 mL IVPB     2 g 100 mL/hr over 30 Minutes Intravenous On call to O.R. 06/17/12 1806 06/17/12 1920       Assessment/Plan 1. S/p ex lap with SBR and LOA for SBO with ischemia  2. S/p I&D of bilateral abdominal wall fluid collections 3. MS 4. Post op ileus  Plan: 1. PCA for pain control 2. Foley out 3. OOB as tolerated 4. Aggressive pulm toilet 5. Await bowel function 6. Dressing changes for abdominal wounds   LOS: 2 days    Dani Danis 06/19/2012, 9:52 AM

## 2012-06-20 MED ORDER — PANTOPRAZOLE SODIUM 40 MG IV SOLR
40.0000 mg | Freq: Two times a day (BID) | INTRAVENOUS | Status: DC
  Administered 2012-06-20 – 2012-06-23 (×7): 40 mg via INTRAVENOUS
  Filled 2012-06-20 (×12): qty 40

## 2012-06-20 NOTE — Progress Notes (Signed)
3 Days Post-Op  Subjective: Sore needs to I and O cath  Objective: Vital signs in last 24 hours: Temp:  [97.9 F (36.6 C)-98.6 F (37 C)] 97.9 F (36.6 C) (05/24 0622) Pulse Rate:  [95-111] 105 (05/24 0622) Resp:  [11-23] 19 (05/24 0622) BP: (142-165)/(72-84) 154/73 mmHg (05/24 0622) SpO2:  [92 %-100 %] 99 % (05/24 0622) Last BM Date: 06/15/12  Intake/Output from previous day: 05/23 0701 - 05/24 0700 In: 2210 [I.V.:2180; NG/GT:30] Out: 1700 [Urine:1350; Emesis/NG output:350] Intake/Output this shift:    Incision/Wound:C/D/I  Slight distension.  Sore    Lab Results:   Recent Labs  06/18/12 0635 06/19/12 0545  WBC 19.5* 17.3*  HGB 8.9* 8.5*  HCT 26.9* 26.5*  PLT 543* 514*   BMET  Recent Labs  06/18/12 0635 06/19/12 0545  NA 138 139  K 4.3 4.1  CL 106 106  CO2 21 24  GLUCOSE 110* 87  BUN 16 13  CREATININE 0.56 0.52  CALCIUM 8.4 8.7   PT/INR No results found for this basename: LABPROT, INR,  in the last 72 hours ABG  Recent Labs  06/17/12 1202  HCO3 26.4*    Studies/Results: No results found.  Anti-infectives: Anti-infectives   Start     Dose/Rate Route Frequency Ordered Stop   06/18/12 0600  cefOXitin (MEFOXIN) 2 g in dextrose 5 % 50 mL IVPB     2 g 100 mL/hr over 30 Minutes Intravenous On call to O.R. 06/17/12 1806 06/17/12 1920      Assessment/Plan: s/p Procedure(s): EXPLORATORY LAPAROTOMY FOR  SMALL BOWEL RESECTION (N/A) Await return of bowel function.   oob  LOS: 3 days    Hajer Dwyer A. 06/20/2012

## 2012-06-20 NOTE — Progress Notes (Signed)
Dr. Dwain Sarna notified pt's NGT output green this am and now maroon to bright red, 150 cc output for the shift, vital signs stable; NGT clamped, protonix BID and CBC ordered.

## 2012-06-21 LAB — CBC
Hemoglobin: 8.7 g/dL — ABNORMAL LOW (ref 12.0–15.0)
MCH: 30.7 pg (ref 26.0–34.0)
MCV: 91.9 fL (ref 78.0–100.0)
RBC: 2.83 MIL/uL — ABNORMAL LOW (ref 3.87–5.11)
WBC: 12.9 10*3/uL — ABNORMAL HIGH (ref 4.0–10.5)

## 2012-06-21 MED ORDER — MORPHINE SULFATE 2 MG/ML IJ SOLN
2.0000 mg | INTRAMUSCULAR | Status: DC | PRN
  Administered 2012-06-22: 2 mg via INTRAVENOUS
  Filled 2012-06-21: qty 1

## 2012-06-21 MED ORDER — HYDROCODONE-ACETAMINOPHEN 5-325 MG PO TABS: 1.0000 | ORAL_TABLET | ORAL | Status: DC | PRN

## 2012-06-21 NOTE — Progress Notes (Signed)
4 Days Post-Op  Subjective: Feels ok.  No nausea with NGT clamped.  Objective: Vital signs in last 24 hours: Temp:  [97.8 F (36.6 C)-98.6 F (37 C)] 98.1 F (36.7 C) (05/25 0630) Pulse Rate:  [91-101] 98 (05/25 0630) Resp:  [16-18] 17 (05/25 0630) BP: (134-155)/(68-90) 151/90 mmHg (05/25 0630) SpO2:  [94 %-99 %] 99 % (05/25 0630) Last BM Date: 06/17/12 (pt reports morning prior to admission)  Intake/Output from previous day: 05/24 0701 - 05/25 0700 In: 2624.6 [I.V.:2594.6; NG/GT:30] Out: 2975 [Urine:2825; Emesis/NG output:150] Intake/Output this shift:    Incision/Wound:clean dry intact.  Soft good BS  Lab Results:   Recent Labs  06/19/12 0545 06/21/12 0434  WBC 17.3* 12.9*  HGB 8.5* 8.7*  HCT 26.5* 26.0*  PLT 514* 539*   BMET  Recent Labs  06/19/12 0545  NA 139  K 4.1  CL 106  CO2 24  GLUCOSE 87  BUN 13  CREATININE 0.52  CALCIUM 8.7   PT/INR No results found for this basename: LABPROT, INR,  in the last 72 hours ABG No results found for this basename: PHART, PCO2, PO2, HCO3,  in the last 72 hours  Studies/Results: No results found.  Anti-infectives: Anti-infectives   Start     Dose/Rate Route Frequency Ordered Stop   06/18/12 0600  cefOXitin (MEFOXIN) 2 g in dextrose 5 % 50 mL IVPB     2 g 100 mL/hr over 30 Minutes Intravenous On call to O.R. 06/17/12 1806 06/17/12 1920      Assessment/Plan: s/p Procedure(s): EXPLORATORY LAPAROTOMY FOR  SMALL BOWEL RESECTION (N/A) Advance diet Remove NGT D/C PCA  LOS: 4 days    Nanna Ertle A. 06/21/2012

## 2012-06-21 NOTE — Progress Notes (Signed)
NG tube DCd; tube intact. Pt tolerated removal of tube. Vwilliams,rn.

## 2012-06-22 MED ORDER — LISINOPRIL 10 MG PO TABS
10.0000 mg | ORAL_TABLET | Freq: Every day | ORAL | Status: DC
  Administered 2012-06-22 – 2012-06-24 (×3): 10 mg via ORAL
  Filled 2012-06-22 (×3): qty 1

## 2012-06-22 NOTE — Progress Notes (Signed)
5 Days Post-Op  Subjective: Doing ok.  Flatus but no BM   Objective: Vital signs in last 24 hours: Temp:  [98 F (36.7 C)-98.6 F (37 C)] 98.3 F (36.8 C) (05/26 0540) Pulse Rate:  [92-107] 107 (05/26 0540) Resp:  [16-17] 17 (05/26 0540) BP: (157-174)/(78-87) 157/87 mmHg (05/26 0540) SpO2:  [96 %-99 %] 99 % (05/26 0540) Last BM Date: 06/17/12  Intake/Output from previous day: 05/25 0701 - 05/26 0700 In: 1803.7 [I.V.:1803.7] Out: 750 [Urine:750] Intake/Output this shift: Total I/O In: 240 [P.O.:240] Out: -   Incision/Wound:CLEAN DRY INTACT   OPEN AREAS  CLEAN.  Lab Results:   Recent Labs  06/21/12 0434  WBC 12.9*  HGB 8.7*  HCT 26.0*  PLT 539*   BMET No results found for this basename: NA, K, CL, CO2, GLUCOSE, BUN, CREATININE, CALCIUM,  in the last 72 hours PT/INR No results found for this basename: LABPROT, INR,  in the last 72 hours ABG No results found for this basename: PHART, PCO2, PO2, HCO3,  in the last 72 hours  Studies/Results: No results found.  Anti-infectives: Anti-infectives   Start     Dose/Rate Route Frequency Ordered Stop   06/18/12 0600  cefOXitin (MEFOXIN) 2 g in dextrose 5 % 50 mL IVPB     2 g 100 mL/hr over 30 Minutes Intravenous On call to O.R. 06/17/12 1806 06/17/12 1920      Assessment/Plan: s/p Procedure(s): EXPLORATORY LAPAROTOMY FOR  SMALL BOWEL RESECTION (N/A) Adv diet once she has BM. Has density right breast on CT not commented by radiology.  Gets mammos in [place] and had one in [place] of this year.  Will need follow up as outpatient.  Exam normal today.  LOS: 5 days    Tishie Altmann A. 06/22/2012

## 2012-06-23 LAB — CULTURE, BLOOD (ROUTINE X 2): Culture: NO GROWTH

## 2012-06-23 MED ORDER — OXYBUTYNIN CHLORIDE 5 MG PO TABS
5.0000 mg | ORAL_TABLET | Freq: Two times a day (BID) | ORAL | Status: DC
Start: 2012-06-23 — End: 2012-06-24
  Administered 2012-06-23 – 2012-06-24 (×3): 5 mg via ORAL
  Filled 2012-06-23 (×4): qty 1

## 2012-06-23 NOTE — Progress Notes (Signed)
  Patient ID: [name], female   DOB: 1955-05-24, 57 y.o.   MRN: [id number] 6 Days Post-Op  Subjective: Good day today, tolerating clears, +flatus but no BM, pain well controlled, walking halls  Objective: Vital signs in last 24 hours: Temp:  [98.2 F (36.8 C)-98.7 F (37.1 C)] 98.7 F (37.1 C) (05/27 0524) Pulse Rate:  [105-110] 107 (05/27 0524) Resp:  [16] 16 (05/27 0524) BP: (148-152)/(66-84) 148/84 mmHg (05/27 0524) SpO2:  [96 %-99 %] 96 % (05/27 0524) Last BM Date: 06/17/12  Intake/Output from previous day: 05/26 0701 - 05/27 0700 In: 1035 [P.O.:360; I.V.:675] Out: -  Intake/Output this shift: Total I/O In: -  Out: 125 [Urine:125]  PE: Abd: soft, incision c/d/i with staples.  Wounds are packed.  Tender, no bowel sounds Lab Results:   Recent Labs  06/21/12 0434  WBC 12.9*  HGB 8.7*  HCT 26.0*  PLT 539*   BMET No results found for this basename: NA, K, CL, CO2, GLUCOSE, BUN, CREATININE, CALCIUM,  in the last 72 hours PT/INR No results found for this basename: LABPROT, INR,  in the last 72 hours CMP     Component Value Date/Time   NA 139 06/19/2012 0545   K 4.1 06/19/2012 0545   CL 106 06/19/2012 0545   CO2 24 06/19/2012 0545   GLUCOSE 87 06/19/2012 0545   BUN 13 06/19/2012 0545   CREATININE 0.52 06/19/2012 0545   CALCIUM 8.7 06/19/2012 0545   PROT 8.1 06/17/2012 0959   ALBUMIN 3.0* 06/17/2012 0959   AST 16 06/17/2012 0959   ALT 9 06/17/2012 0959   ALKPHOS 120* 06/17/2012 0959   BILITOT 0.3 06/17/2012 0959   GFRNONAA >90 06/19/2012 0545   GFRAA >90 06/19/2012 0545   Lipase     Component Value Date/Time   LIPASE 14 06/17/2012 0959       Studies/Results: No results found.  Anti-infectives: Anti-infectives   Start     Dose/Rate Route Frequency Ordered Stop   06/18/12 0600  cefOXitin (MEFOXIN) 2 g in dextrose 5 % 50 mL IVPB     2 g 100 mL/hr over 30 Minutes Intravenous On call to O.R. 06/17/12 1806 06/17/12 1920       Assessment/Plan 1. S/p ex  lap with SBR and LOA for SBO with ischemia  2. S/p I&D of bilateral abdominal wall fluid collections 3. MS 4. Post op ileus 5. Breast mass  Plan: 1. IV and PO for pain control 2. OOB as tolerated 3. Aggressive pulm toilet 4. Dressing changes for abdominal wounds 5. Outpatient work up for breast mass  LOS: 6 days    Beni Turrell 06/23/2012, 10:07 AM

## 2012-06-23 NOTE — Progress Notes (Signed)
See PA note from today  No c/o. Feels good today No n/v. Tolerated clears. +flatus. No BM  Alert, NAD Soft, nt, nd, incision c/d/i  fulls for lunch Ambulate tid outpt workup for breast mass Hopefully home wed  [name]. Andrey Campanile, MD, FACS General, Bariatric, & Minimally Invasive Surgery [facility], [place]

## 2012-06-24 MED ORDER — PANTOPRAZOLE SODIUM 40 MG PO TBEC
40.0000 mg | DELAYED_RELEASE_TABLET | Freq: Two times a day (BID) | ORAL | Status: DC
  Administered 2012-06-24: 40 mg via ORAL
  Filled 2012-06-24: qty 1

## 2012-06-24 MED ORDER — HYDROCODONE-ACETAMINOPHEN 5-325 MG PO TABS: 1.0000 | ORAL_TABLET | Freq: Four times a day (QID) | ORAL | Status: DC | PRN

## 2012-06-24 NOTE — Care Management Note (Signed)
  Page 1 of 1   06/24/2012     10:18:47 AM   CARE MANAGEMENT NOTE 06/24/2012  Patient:  TEIARA, BARIA   Account Number:  [id number]  Date Initiated:  06/24/2012  Documentation initiated by:  Ronny Flurry  Subjective/Objective Assessment:     Action/Plan:   Anticipated DC Date:  06/24/2012   Anticipated DC Plan:  HOME W HOME HEALTH SERVICES         Choice offered to / List presented to:  C-1 Patient        HH arranged  HH-1 RN      [facility] agency  [facility]   Status of service:  Completed, signed off Medicare Important Message given?   (If response is "NO", the following Medicare IM given date fields will be blank) Date Medicare IM given:   Date Additional Medicare IM given:    Discharge Disposition:    Per UR Regulation:    If discussed at Long Length of Stay Meetings, dates discussed:    Comments:

## 2012-06-24 NOTE — Progress Notes (Signed)
Patient discharged home with instructions, verbalized understanding, demonstrated dressing change while husband observing.

## 2012-06-24 NOTE — Discharge Summary (Signed)
Physician Discharge Summary  Patient ID: [name] MRN: [id number] DOB/AGE: 59-Jan-1957 59 y.o.  Admit date: 06/17/2012 Discharge date: 06/24/2012  Admitting Diagnosis: Abdominal wall fluid collection/abscess Multiple Sclerosis History of recurrent UTI's  Discharge Diagnosis Patient Active Problem List   Diagnosis Date Noted  . Small bowel obstruction 06/17/2012  . Abdominal wall fluid collections 06/15/2012  . Abdominal pain, RUQ (right upper quadrant) 06/15/2012  . UTI (urinary tract infection) 06/03/2012  . Multiple sclerosis 06/03/2012  . Abdominal pain 06/01/2012  . Abdominal fluid collection 05/07/2012  . LUQ abdominal pain 05/05/2012    Consultants None  Imaging: No results found.  Procedures Dr. Magnus Ivan (06/17/12) - Exploratory laparotomy, small bowel resection Dr. Donell Beers (06/13/12) - Incision and drainage complex bilateral abdominal wall abscess  Hospital Course:  57 year old female with a history of MS, well known to our service for several recent admissions for abdominal pain with bilateral fluid collections likely secondary to her Beta Seron injections for her MS. She was taken to the operating room over the weekend where she had these two fluid collections I&D. Her fluid was clear, and her cultures had no growth. The right sided culture had some E. Coli in the culture, but this is likely a contaminate as it otherwise had no organisms. She did well from that and was discharged home yesterday on Augmentin. She had no complaints at that time.   She went out to eat on 06/16/12 and ate fish. Her husband also ate this and has had no issues. After this, she started having profuse nausea and vomiting. HH came to evaluate the patient this morning and felt she was dehydrated and should be evaluated. She was brought to [facility] where she has been found to be tachycardic and has a WBC of 26.7K. She does have a left shift with a neutrophil count of 93. She denies any fevers. We  have been asked to see her for further evaluation given our recent involvement.  She had developed a SBO (per CT scan) with dilated loops of bowel, surrounding edema and inflammatory changes along with free fluid on 06/17/12.  She was taken to the OR for an Ex lap with LOA and SBR.  Tolerated procedure well and was transferred to the floor.  Diet was advanced as tolerated.  On POD #7, the patient was voiding well, tolerating diet, ambulating well, pain well controlled, vital signs stable, abdominal wound c/d and felt stable for discharge home. She will need HH for wound dressing changes which she already has set up.  Patient will follow up in our office in 2 weeks and knows to call with questions or concerns.   Physical Exam: General:  Alert, NAD, pleasant, comfortable Abd:  Soft, ND, mild tenderness, abdominal wall wounds in RUQ/LUQ C/D, midline with staples in place    Medication List    STOP taking these medications       amoxicillin-clavulanate 875-125 MG per tablet  Commonly known as:  AUGMENTIN      TAKE these medications       aspirin-acetaminophen-caffeine 250-250-65 MG per tablet  Commonly known as:  EXCEDRIN MIGRAINE  Take 2 tablets by mouth every 6 (six) hours as needed for pain.     CALCIUM 600 + D PO  Take 1 tablet by mouth daily.     ferrous sulfate 325 (65 FE) MG tablet  Take 325 mg by mouth daily with breakfast.     HYDROcodone-acetaminophen 5-325 MG per tablet  Commonly known as:  NORCO/VICODIN  Take 1-2 tablets by mouth every 6 (six) hours as needed.     lisinopril 10 MG tablet  Commonly known as:  PRINIVIL,ZESTRIL  Take 10 mg by mouth daily.     multivitamin tablet  Take 1 tablet by mouth daily.     ondansetron 4 MG tablet  Commonly known as:  ZOFRAN  Take 4 mg by mouth every 8 (eight) hours as needed for nausea.     OSTEO BI-FLEX ADV JOINT SHIELD PO  Take 1 tablet by mouth daily.     oxybutynin 5 MG tablet  Commonly known as:  DITROPAN  Take 5 mg by  mouth 2 (two) times daily.             Follow-up Information   Follow up with [facility], MD In 2 weeks.   Contact information:   [address] [place] [address] [phone]       Follow up with [facility],MD, MD In 7 days. (NURSE ONLY VISIT FOR YOUR STAPLE REMOVAL)    Contact information:   [address] [place] [address] [phone]       Signed: Candiss Norse [facility] [phone]  06/24/2012, 11:10 AM

## 2012-06-24 NOTE — Discharge Summary (Signed)
I have seen and examined the pt and agree with PA-Dort's progress note.  

## 2012-06-26 ENCOUNTER — Telehealth (INDEPENDENT_AMBULATORY_CARE_PROVIDER_SITE_OTHER): Payer: Self-pay | Admitting: General Surgery

## 2012-06-26 NOTE — Telephone Encounter (Signed)
Brayton Caves, nurse with [facility], called to inform you this pt was seen today for "resumption of care," but she no longer meets the Medicaid criteria for homebound.  Consequently, she will not be receiving home health care.  FYI.

## 2012-06-30 ENCOUNTER — Ambulatory Visit (INDEPENDENT_AMBULATORY_CARE_PROVIDER_SITE_OTHER): Payer: Medicare Other | Admitting: Surgery

## 2012-06-30 ENCOUNTER — Encounter (INDEPENDENT_AMBULATORY_CARE_PROVIDER_SITE_OTHER): Payer: Self-pay | Admitting: Surgery

## 2012-06-30 ENCOUNTER — Encounter (INDEPENDENT_AMBULATORY_CARE_PROVIDER_SITE_OTHER): Payer: Medicare Other

## 2012-06-30 VITALS — BP 120/78 | HR 64 | Temp 98.0°F | Resp 16 | Ht 61.0 in | Wt 118.0 lb

## 2012-06-30 DIAGNOSIS — Z09 Encounter for follow-up examination after completed treatment for conditions other than malignant neoplasm: Secondary | ICD-10-CM

## 2012-06-30 NOTE — H&P (Signed)
[name] 1955-08-24  [id number].   Chief Complaint/Reason for Consult: abdominal pain HPI: This is a patient well known to our service for several recent admissions for abdominal pain with bilateral fluid collections likely secondary to her Beta Seron injections for her MS. She was taken to the operating room over the weekend where she had these two fluid collections I&D. Her fluid was clear, and her cultures had no growth. The right sided culture had some E. Coli in the culture, but this is likely a contaminate as it otherwise had no organisms. She did well from that and was discharged home yesterday on Augmentin. She had no complaints at that time.  She went to [facility] last night and ate fish. Her husband also ate this and has had no issues. After this, she started having profuse nausea and vomiting. HH came to evaluate the patient this morning and felt she was dehydrated and should be evaluated. She was brought to [facility] where she has been found to be tachycardic and has a WBC of 26.7K. She does have a left shift with a neutrophil count of 93. She denies any fevers. We have been asked to see her for further evaluation given our recent involvement. She is unable to tell me right now whether her pain is new or similar to prior pain.   Review of Systems: Please see HPI, otherwise all other systems are negative  Family History  Problem Relation Age of Onset  . Heart disease Mother   . Congestive Heart Failure Mother   . Heart disease Father   . Stroke Father   . Heart disease Brother   . Heart attack Brother     Past Medical History  Diagnosis Date  . Hypertension   . Multiple sclerosis 1972  . Self-catheterizes urinary bladder ~ 2012    "sphincter in bladder doesn't work properly due to my MS" (05/17/2012  . History of recurrent UTIs   . History of blood transfusion ~ 2012    "after multiple UTI's; got bladder infection; passed alot of blood" (05/05/2012)  . VOZDGUYQ(034.7)    "several/wk" (05/17/2012)  . 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 Abdominal fluid collection 05/07/2012  . Abdominal wall fluid collections 06/15/2012  . Abdominal pain, RUQ (right upper quadrant) 06/15/2012  . Small bowel obstruction 06/17/2012  . Neuromuscular disorder     MS    Past Surgical History  Procedure Laterality Date  . Vaginal hysterectomy  2011  . Wound debridement Bilateral 06/13/2012    Procedure: DEBRIDEMENT ABDOMINAL WOUND;  Surgeon: Almond Lint, MD;  Location: [facility] OR;  Service: General;  Laterality: Bilateral;  . Laparotomy N/A 06/17/2012    Procedure: EXPLORATORY LAPAROTOMY FOR  SMALL BOWEL RESECTION;  Surgeon: Shelly Rubenstein, MD;  Location: [facility] OR;  Service: General;  Laterality: N/A;    Social History:  reports that she has never smoked. She has never used smokeless tobacco. She reports that she does not drink alcohol or use illicit drugs.  Allergies:  Allergies  Allergen Reactions  . Oxycodone Nausea And Vomiting    No prescriptions prior to admission    Blood pressure 143/72, pulse 111, temperature 99.1 F (37.3 C), temperature source Oral, resp. rate 20, height 5\' 1"  (1.549 m), weight 125 lb (56.7 kg), SpO2 98.00%. Physical Exam: Abd: soft, tender in the RLQ and diffusely around her upper abdominal incisions. Packing is removed from both of her wounds. These wounds are completely clean. Fascia is visible on the right side and  is white, shiny, and healthy. The left-sided wound is clean with minimal fibrin near the base. She is nondistended and has a few BS. No peritoneal signs, guarding, or rebounding.  Heart: tachycardic  Lungs: CTAB   No results found for this or any previous visit (from the past 48 hour(s)). No results found.     Assessment/Plan 1. Leukocytosis  2. Abdominal pain  3. S/p I&D of bilateral abdominal wall fluid collections  4. ? UTI  5. MS   Plan:  1. I have evaluated the patient. Her urine is dark and has some leukocytes and bacteria in  it. However, given her WBC, left shift, and tachycardia, I think it is reasonable to obtain a CT scan to rule out any other intra-abdominal process. I think the findings on her abdominal film are likely a reactive ileus from some other process. We do not have enough information right now to say that her illness is secondary to her prior surgical problem or a new surgical problem. We will follow along and see what her CT scan shows. If these findings are all secondary to UTI or a medical problem we would recommend a medical admission and we will follow the patient while she is here.   Elizer Bostic E 06/30/2012, 2:44 PM Pager: [phone]

## 2012-06-30 NOTE — Progress Notes (Signed)
Subjective:     Patient ID: [name], female   DOB: 1955/05/09, 57 y.o.   MRN: [id number]  HPI She is here for a postop visit. She is actually status post drainage of abdominal wall abscesses and a week later explore for laparotomy for small bowel obstruction which is unrelated. She is now doing very well. She is eating well moving her bowels well. She is undergoing wet-to-dry dressing changes twice daily on the abdominal wounds.  Review of Systems     Objective:   Physical Exam On exam, the midline wound is healed well and I removed the staples and place Steri-Strips. The 2 open wounds are clean as well. I did remove some fibrinous exudate from the left wound.    Assessment:     Patient stable postop     Plan:     She will continue to refrain from heavy lifting. They will change his dressings now only once a day with wet-to-dry normal saline. I'll see her back in 3-4 weeks.

## 2012-07-08 ENCOUNTER — Ambulatory Visit (INDEPENDENT_AMBULATORY_CARE_PROVIDER_SITE_OTHER): Payer: Medicare Other | Admitting: General Surgery

## 2012-07-08 VITALS — BP 130/70 | Ht 61.0 in

## 2012-07-08 DIAGNOSIS — R188 Other ascites: Secondary | ICD-10-CM

## 2012-07-08 NOTE — Patient Instructions (Signed)
Use dakins to moisten gauze then pack wound twice a day Shower daily and irrigate open wound in shower

## 2012-07-20 ENCOUNTER — Ambulatory Visit (INDEPENDENT_AMBULATORY_CARE_PROVIDER_SITE_OTHER): Payer: Medicare Other | Admitting: Surgery

## 2012-07-20 ENCOUNTER — Encounter (INDEPENDENT_AMBULATORY_CARE_PROVIDER_SITE_OTHER): Payer: Self-pay | Admitting: Surgery

## 2012-07-20 VITALS — BP 124/78 | HR 98 | Temp 99.2°F | Resp 16 | Ht 61.0 in | Wt 118.0 lb

## 2012-07-20 DIAGNOSIS — Z09 Encounter for follow-up examination after completed treatment for conditions other than malignant neoplasm: Secondary | ICD-10-CM

## 2012-07-20 NOTE — Progress Notes (Signed)
Subjective:     Patient ID: [name], female   DOB: 1955-08-06, 57 y.o.   MRN: [id number]  HPI She is here for another wound check. The wound is doing well since switching to [place] solution. She is also eating well  Review of Systems     Objective:   Physical Exam On exam, the wounds are very clean in appearance with no odor or drainage    Assessment:     Patient stable postop     Plan:     She will now go back to the normal saline twice a day wet-to-dry dressing changes. I will see her back on July 2

## 2012-07-24 ENCOUNTER — Encounter (INDEPENDENT_AMBULATORY_CARE_PROVIDER_SITE_OTHER): Payer: Self-pay | Admitting: General Surgery

## 2012-07-24 NOTE — Progress Notes (Signed)
Subjective:     Patient ID: [name], female   DOB: 06-17-1955, 57 y.o.   MRN: [id number]  HPI The patient is a 57 year old female who is about 3 weeks status post drainage of abdominal wall abscess and small bowel resection by Dr. Magnus Ivan. She complains of some discomfort and some drainage from the wound. She denies any fevers or chills. Her appetite is improving and her bowels are moving regularly.  Review of Systems     Objective:   Physical Exam On exam the wound is relatively clean with some greenish exudate.    Assessment:     The patient is 3 weeks status post abdominal wall abscess and small bowel resection     Plan:     At this point I think she needs to get in the shower and irrigate the wound. I would recommend changing the dressing using [place] solution. We will have her follow up with Dr. Magnus Ivan next week for another wound check.

## 2012-07-29 ENCOUNTER — Encounter (INDEPENDENT_AMBULATORY_CARE_PROVIDER_SITE_OTHER): Payer: Self-pay | Admitting: Surgery

## 2012-07-29 ENCOUNTER — Ambulatory Visit (INDEPENDENT_AMBULATORY_CARE_PROVIDER_SITE_OTHER): Payer: Medicare Other | Admitting: Surgery

## 2012-07-29 VITALS — BP 110/60 | HR 62 | Temp 98.0°F | Resp 11 | Ht 61.0 in | Wt 115.8 lb

## 2012-07-29 DIAGNOSIS — Z09 Encounter for follow-up examination after completed treatment for conditions other than malignant neoplasm: Secondary | ICD-10-CM

## 2012-07-29 NOTE — Progress Notes (Signed)
Subjective:     Patient ID: [name], female   DOB: Sep 27, 1955, 57 y.o.   MRN: [id number]  HPI She is here for another postop visit. She has no complaints and is doing well  Review of Systems     Objective:   Physical Exam Both her wounds are continued contract well. I treated with silver nitrate    Assessment:     Patient stable postop    Plan:     She will continue the current wound care and I will see her back as needed

## 2012-09-08 ENCOUNTER — Encounter (INDEPENDENT_AMBULATORY_CARE_PROVIDER_SITE_OTHER): Payer: Self-pay | Admitting: Surgery

## 2012-09-08 ENCOUNTER — Ambulatory Visit (INDEPENDENT_AMBULATORY_CARE_PROVIDER_SITE_OTHER): Payer: Medicare Other | Admitting: Surgery

## 2012-09-08 VITALS — BP 112/77 | HR 66 | Temp 98.6°F | Resp 14 | Ht 61.0 in | Wt 120.2 lb

## 2012-09-08 DIAGNOSIS — T8189XA Other complications of procedures, not elsewhere classified, initial encounter: Secondary | ICD-10-CM

## 2012-09-08 NOTE — Progress Notes (Signed)
Subjective:     Patient ID: [name], female   DOB: [birth year], 57 y.o.   MRN: [id number]  HPI She is here for a wound check. She reports that the left side is no longer draining. She has minimal drainage from the right side  Review of Systems     Objective:   Physical Exam On exam of her abdomen. The  Left-sided wound is now closed. There is a small area of granulation tissue on the right side which are treated with silver nitrate    Assessment:     Nonhealing surgical wound     Plan:     She no longer needs a bandage on the left side. She will cover the right side with a dry gauze. I will see her back the wound continues to drain. If not I will see her as needed

## 2012-09-25 IMAGING — US US RENAL
1 series · 14 of 25 positions shown · non-contrast
Comparison: None

CLINICAL DATA: Urinary tract infections, pelvic pain

RENAL/URINARY TRACT ULTRASOUND COMPLETE

[Series 1: us renal · 0.25mm/px · 27 acquisitions, 14 frames shown]
[im 1/27]
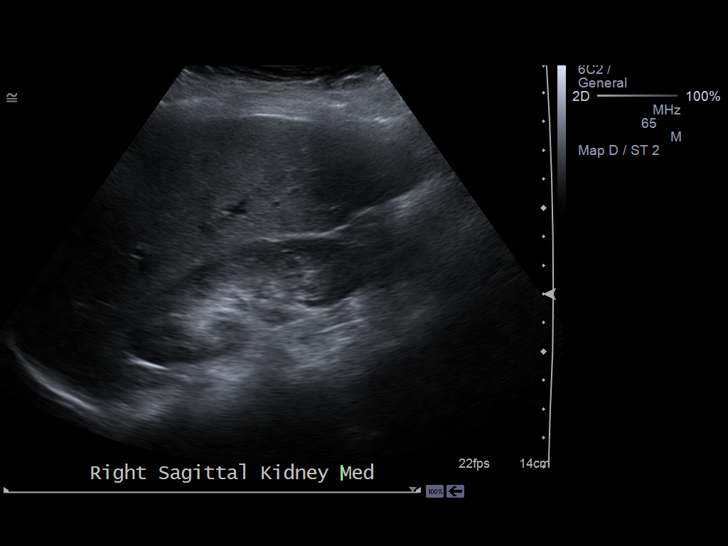
[im 3/27]
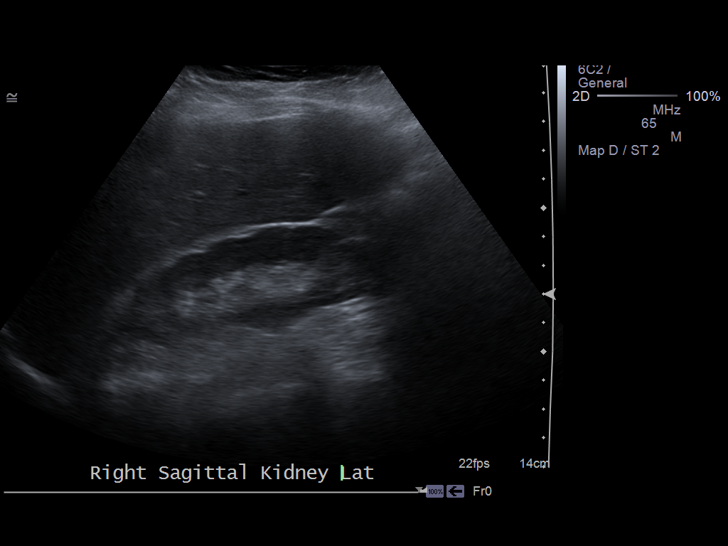
[im 5/27]
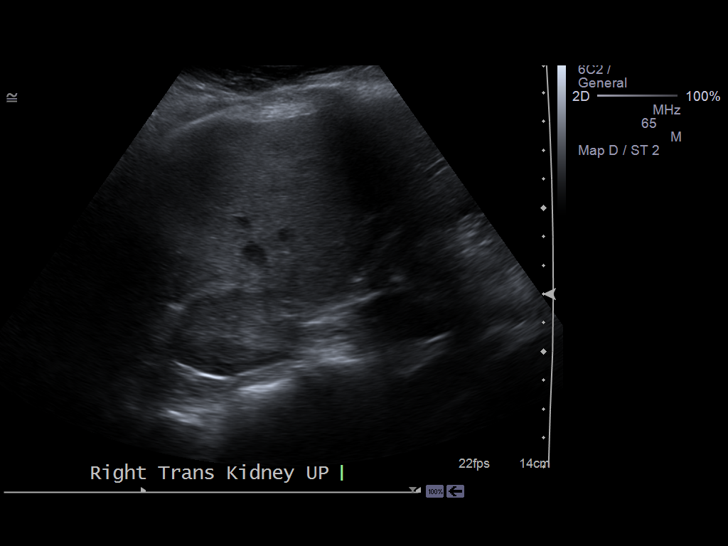
[im 7/27]
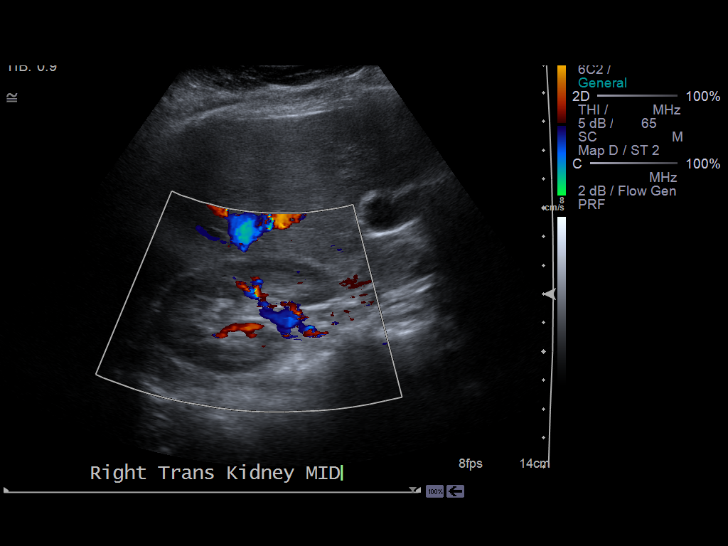
[im 9/27]
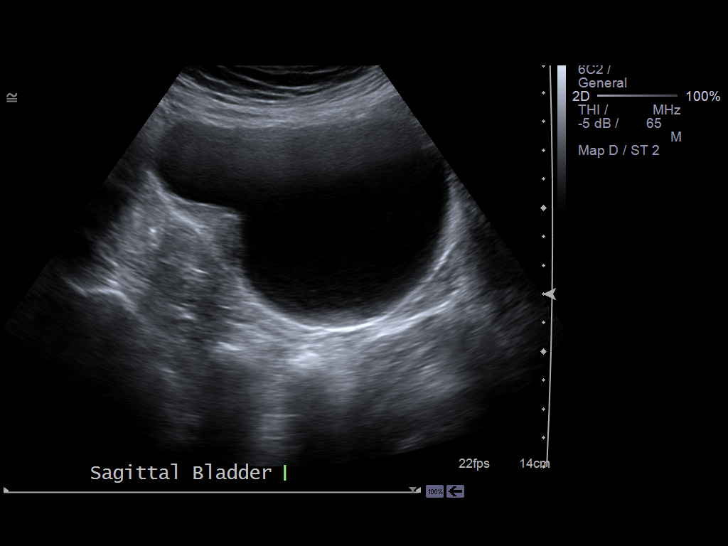
[im 10/27]
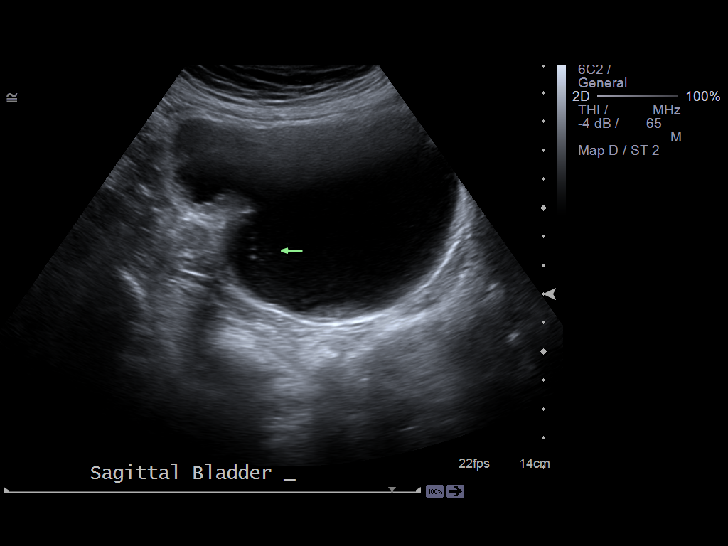
[im 12/27]
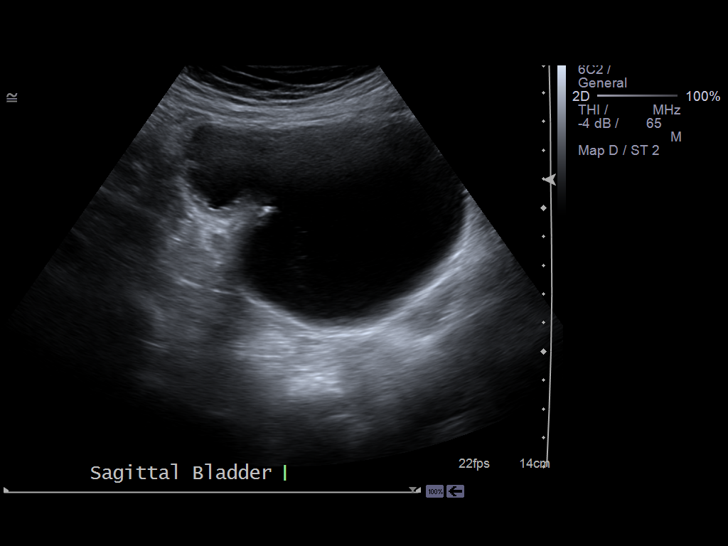
[im 15/27]
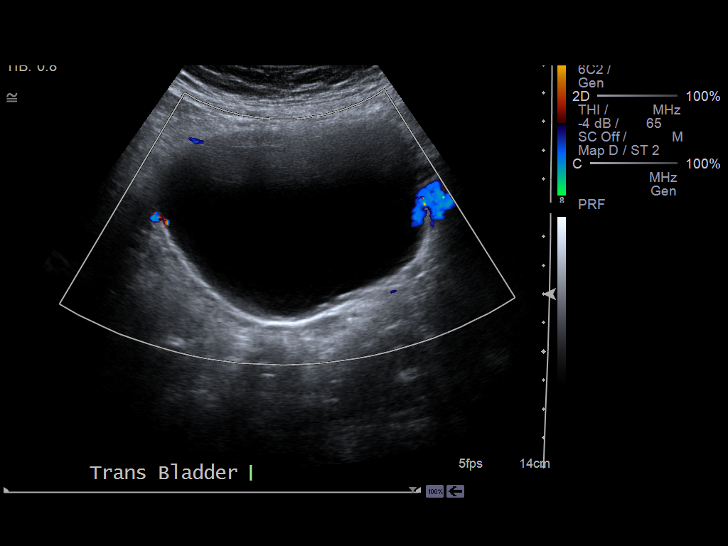
[im 17/27]
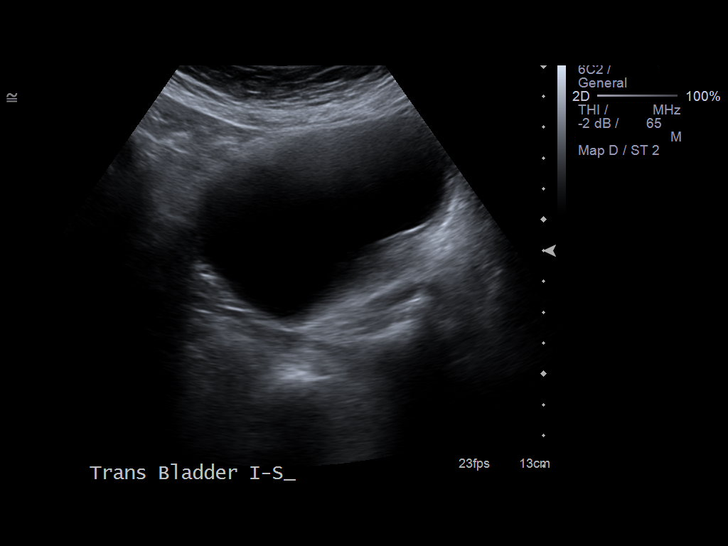
[im 18/27]
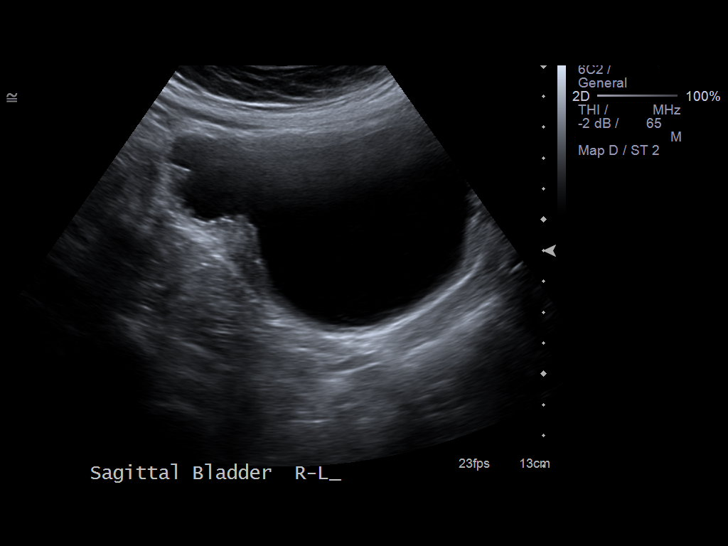
[im 20/27]
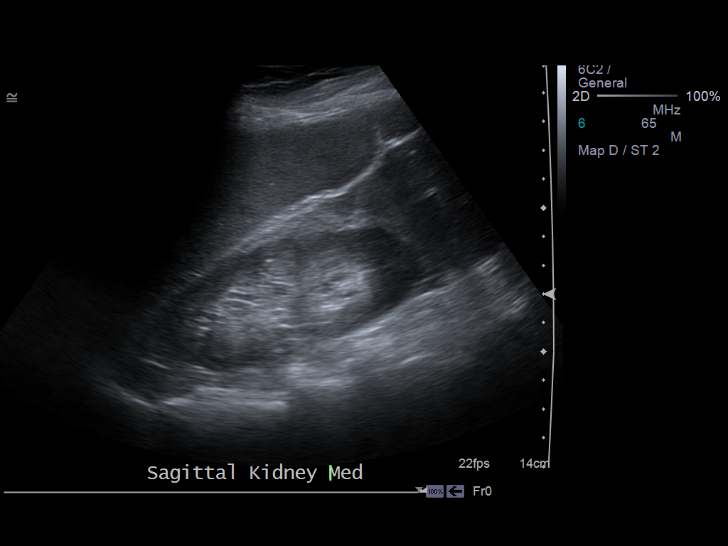
[im 22/27]
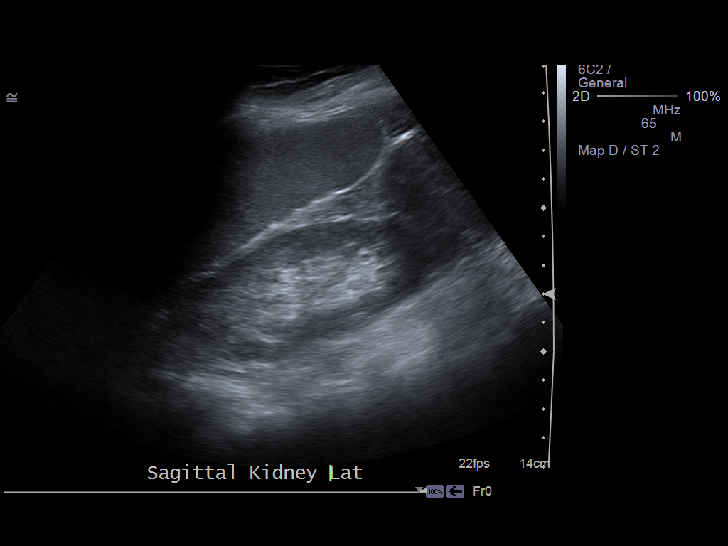
[im 24/27]
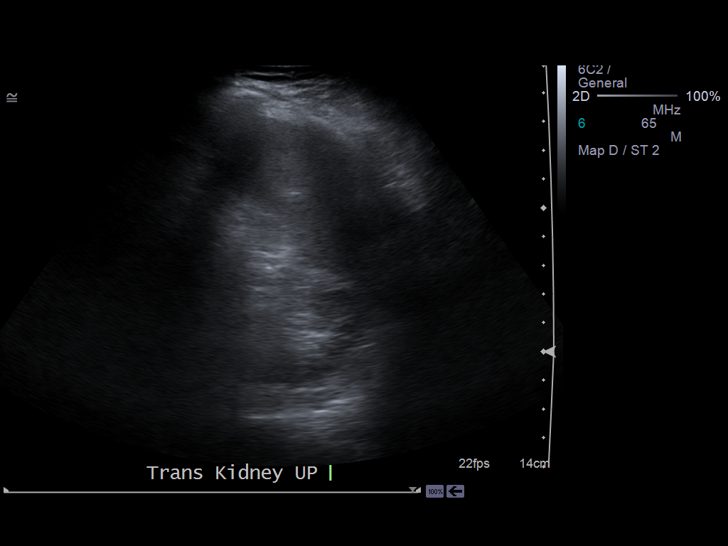
[im 27/27]
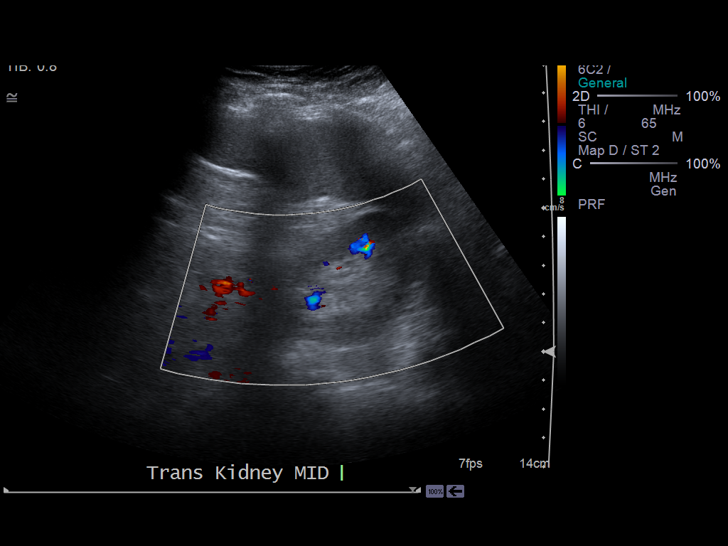

[14 of 25 positions shown; findings below may reference images not displayed]

FINDINGS: Right Kidney:  9.4 cm length.  Normal cortical thickness.  Cortical
medullary differentiation is not well visualized.  No mass,
hydronephrosis, or shadowing calcification.  No perinephric fluid.

Left Kidney:  10.2 cm length.  Minimal cortical thinning versus
right kidney.  No mass, hydronephrosis, or shadowing calcification.
No perinephric fluid.

Bladder:  Diffuse bladder wall thickening.  More focal area of wall
thickening question mass at the superior aspect, 3.3 cm diameter.
Dependent debris.  No definite calculi.
IMPRESSION: No significant renal abnormalities.
Diffuse bladder wall thickening with question focal area of wall
thickening versus mass at superior aspect, tumor not excluded;
recommend cystoscopic evaluation.

## 2012-10-26 ENCOUNTER — Encounter (INDEPENDENT_AMBULATORY_CARE_PROVIDER_SITE_OTHER): Payer: Self-pay

## 2012-10-27 ENCOUNTER — Encounter (INDEPENDENT_AMBULATORY_CARE_PROVIDER_SITE_OTHER): Payer: Self-pay

## 2013-08-04 ENCOUNTER — Inpatient Hospital Stay (HOSPITAL_COMMUNITY)
Admission: EM | Admit: 2013-08-04 | Discharge: 2013-08-06 | DRG: 389 | Disposition: A | Discharge status: Home / Self Care | Payer: Medicare Other | Attending: Internal Medicine | Admitting: Internal Medicine

## 2013-08-04 ENCOUNTER — Emergency Department (HOSPITAL_COMMUNITY)
Admission: EM | Admit: 2013-08-04 | Discharge: 2013-08-04 | Disposition: A | Discharge status: Home / Self Care | Payer: Medicare Other | Source: Home / Self Care | Attending: Emergency Medicine | Admitting: Emergency Medicine

## 2013-08-04 ENCOUNTER — Encounter (HOSPITAL_COMMUNITY): Payer: Self-pay | Admitting: Emergency Medicine

## 2013-08-04 ENCOUNTER — Emergency Department (HOSPITAL_COMMUNITY): Payer: Medicare Other

## 2013-08-04 DIAGNOSIS — K567 Ileus, unspecified: Secondary | ICD-10-CM | POA: Diagnosis present

## 2013-08-04 DIAGNOSIS — I10 Essential (primary) hypertension: Secondary | ICD-10-CM | POA: Insufficient documentation

## 2013-08-04 DIAGNOSIS — R1012 Left upper quadrant pain: Secondary | ICD-10-CM

## 2013-08-04 DIAGNOSIS — Z8669 Personal history of other diseases of the nervous system and sense organs: Secondary | ICD-10-CM | POA: Insufficient documentation

## 2013-08-04 DIAGNOSIS — Z79899 Other long term (current) drug therapy: Secondary | ICD-10-CM | POA: Insufficient documentation

## 2013-08-04 DIAGNOSIS — R1110 Vomiting, unspecified: Secondary | ICD-10-CM

## 2013-08-04 DIAGNOSIS — R109 Unspecified abdominal pain: Secondary | ICD-10-CM

## 2013-08-04 DIAGNOSIS — N390 Urinary tract infection, site not specified: Secondary | ICD-10-CM | POA: Diagnosis present

## 2013-08-04 DIAGNOSIS — K56609 Unspecified intestinal obstruction, unspecified as to partial versus complete obstruction: Secondary | ICD-10-CM

## 2013-08-04 DIAGNOSIS — Z8619 Personal history of other infectious and parasitic diseases: Secondary | ICD-10-CM | POA: Insufficient documentation

## 2013-08-04 DIAGNOSIS — K5289 Other specified noninfective gastroenteritis and colitis: Secondary | ICD-10-CM | POA: Diagnosis present

## 2013-08-04 DIAGNOSIS — R188 Other ascites: Secondary | ICD-10-CM

## 2013-08-04 DIAGNOSIS — K560 Paralytic ileus: Secondary | ICD-10-CM

## 2013-08-04 DIAGNOSIS — Z87440 Personal history of urinary (tract) infections: Secondary | ICD-10-CM | POA: Insufficient documentation

## 2013-08-04 DIAGNOSIS — R1011 Right upper quadrant pain: Secondary | ICD-10-CM

## 2013-08-04 DIAGNOSIS — Z823 Family history of stroke: Secondary | ICD-10-CM

## 2013-08-04 DIAGNOSIS — Z885 Allergy status to narcotic agent status: Secondary | ICD-10-CM

## 2013-08-04 DIAGNOSIS — Z8249 Family history of ischemic heart disease and other diseases of the circulatory system: Secondary | ICD-10-CM

## 2013-08-04 DIAGNOSIS — G35 Multiple sclerosis: Secondary | ICD-10-CM | POA: Diagnosis present

## 2013-08-04 LAB — CBC
HEMATOCRIT: 34.9 % — AB (ref 36.0–46.0)
HEMATOCRIT: 34.5 % — AB (ref 36.0–46.0)
HEMOGLOBIN: 11.6 g/dL — AB (ref 12.0–15.0)
HEMOGLOBIN: 11.5 g/dL — AB (ref 12.0–15.0)
MCH: 32.7 pg (ref 26.0–34.0)
MCH: 32.9 pg (ref 26.0–34.0)
MCHC: 33.2 g/dL (ref 30.0–36.0)
MCHC: 33.3 g/dL (ref 30.0–36.0)
MCV: 98.3 fL (ref 78.0–100.0)
MCV: 98.6 fL (ref 78.0–100.0)
PLATELETS: 261 10*3/uL (ref 150–400)
Platelets: 271 10*3/uL (ref 150–400)
RBC: 3.55 MIL/uL — AB (ref 3.87–5.11)
RBC: 3.5 MIL/uL — ABNORMAL LOW (ref 3.87–5.11)
RDW: 13.1 % (ref 11.5–15.5)
RDW: 13.3 % (ref 11.5–15.5)
WBC: 12.4 10*3/uL — AB (ref 4.0–10.5)
WBC: 11 10*3/uL — ABNORMAL HIGH (ref 4.0–10.5)

## 2013-08-04 LAB — CBC WITH DIFFERENTIAL/PLATELET
BASOS ABS: 0 10*3/uL (ref 0.0–0.1)
BASOS PCT: 0 % (ref 0–1)
EOS ABS: 0.1 10*3/uL (ref 0.0–0.7)
Eosinophils Relative: 1 % (ref 0–5)
HCT: 35.4 % — ABNORMAL LOW (ref 36.0–46.0)
Hemoglobin: 11.7 g/dL — ABNORMAL LOW (ref 12.0–15.0)
Lymphocytes Relative: 25 % (ref 12–46)
Lymphs Abs: 2.6 10*3/uL (ref 0.7–4.0)
MCH: 33.1 pg (ref 26.0–34.0)
MCHC: 33.1 g/dL (ref 30.0–36.0)
MCV: 100 fL (ref 78.0–100.0)
MONO ABS: 0.6 10*3/uL (ref 0.1–1.0)
Monocytes Relative: 6 % (ref 3–12)
NEUTROS ABS: 6.9 10*3/uL (ref 1.7–7.7)
NEUTROS PCT: 68 % (ref 43–77)
Platelets: 279 10*3/uL (ref 150–400)
RBC: 3.54 MIL/uL — ABNORMAL LOW (ref 3.87–5.11)
RDW: 13.1 % (ref 11.5–15.5)
WBC: 10.1 10*3/uL (ref 4.0–10.5)

## 2013-08-04 LAB — URINALYSIS, ROUTINE W REFLEX MICROSCOPIC
BILIRUBIN URINE: NEGATIVE
Glucose, UA: NEGATIVE mg/dL
Ketones, ur: NEGATIVE mg/dL
NITRITE: NEGATIVE
PROTEIN: NEGATIVE mg/dL
SPECIFIC GRAVITY, URINE: 1.02 (ref 1.005–1.030)
UROBILINOGEN UA: 0.2 mg/dL (ref 0.0–1.0)
pH: 7 (ref 5.0–8.0)

## 2013-08-04 LAB — URINE MICROSCOPIC-ADD ON

## 2013-08-04 LAB — I-STAT CHEM 8, ED
BUN: 17 mg/dL (ref 6–23)
CREATININE: 1 mg/dL (ref 0.50–1.10)
Calcium, Ion: 1.25 mmol/L (ref 1.13–1.30)
Chloride: 103 mEq/L (ref 96–112)
GLUCOSE: 128 mg/dL — AB (ref 70–99)
HEMATOCRIT: 37 % (ref 36.0–46.0)
HEMOGLOBIN: 12.6 g/dL (ref 12.0–15.0)
POTASSIUM: 3.8 meq/L (ref 3.7–5.3)
Sodium: 142 mEq/L (ref 137–147)
TCO2: 29 mmol/L (ref 0–100)

## 2013-08-04 LAB — COMPREHENSIVE METABOLIC PANEL
ALK PHOS: 54 U/L (ref 39–117)
ALT: 20 U/L (ref 0–35)
ANION GAP: 15 (ref 5–15)
AST: 27 U/L (ref 0–37)
Albumin: 4.3 g/dL (ref 3.5–5.2)
BUN: 18 mg/dL (ref 6–23)
CHLORIDE: 102 meq/L (ref 96–112)
CO2: 26 meq/L (ref 19–32)
Calcium: 10 mg/dL (ref 8.4–10.5)
Creatinine, Ser: 1.03 mg/dL (ref 0.50–1.10)
GFR, EST AFRICAN AMERICAN: 67 mL/min — AB (ref >90)
GFR, EST NON AFRICAN AMERICAN: 58 mL/min — AB (ref >90)
GLUCOSE: 118 mg/dL — AB (ref 70–99)
POTASSIUM: 4.2 meq/L (ref 3.7–5.3)
Sodium: 143 mEq/L (ref 137–147)
Total Protein: 7.1 g/dL (ref 6.0–8.3)

## 2013-08-04 LAB — BASIC METABOLIC PANEL
ANION GAP: 13 (ref 5–15)
BUN: 17 mg/dL (ref 6–23)
CALCIUM: 9 mg/dL (ref 8.4–10.5)
CHLORIDE: 102 meq/L (ref 96–112)
CO2: 26 meq/L (ref 19–32)
CREATININE: 0.93 mg/dL (ref 0.50–1.10)
GFR calc Af Amer: 76 mL/min — ABNORMAL LOW (ref >90)
GFR calc non Af Amer: 65 mL/min — ABNORMAL LOW (ref >90)
GLUCOSE: 121 mg/dL — AB (ref 70–99)
Potassium: 4.6 mEq/L (ref 3.7–5.3)
Sodium: 141 mEq/L (ref 137–147)

## 2013-08-04 LAB — GLUCOSE, CAPILLARY: Glucose-Capillary: 103 mg/dL — ABNORMAL HIGH (ref 70–99)

## 2013-08-04 LAB — CREATININE, SERUM
Creatinine, Ser: 0.86 mg/dL (ref 0.50–1.10)
GFR calc Af Amer: 83 mL/min — ABNORMAL LOW (ref >90)
GFR, EST NON AFRICAN AMERICAN: 72 mL/min — AB (ref >90)

## 2013-08-04 MED ORDER — KCL IN DEXTROSE-NACL 20-5-0.45 MEQ/L-%-% IV SOLN
INTRAVENOUS | Status: DC
  Administered 2013-08-05: 100 mL/h via INTRAVENOUS
  Filled 2013-08-04 (×3): qty 1000

## 2013-08-04 MED ORDER — PROMETHAZINE HCL 12.5 MG RE SUPP: 12.5000 mg | Freq: Four times a day (QID) | RECTAL | Status: DC | PRN

## 2013-08-04 MED ORDER — KETOROLAC TROMETHAMINE 30 MG/ML IJ SOLN
30.0000 mg | Freq: Once | INTRAMUSCULAR | Status: AC
  Administered 2013-08-04: 30 mg via INTRAVENOUS
  Filled 2013-08-04: qty 1

## 2013-08-04 MED ORDER — MORPHINE SULFATE 4 MG/ML IJ SOLN
2.0000 mg | Freq: Once | INTRAMUSCULAR | Status: AC
  Administered 2013-08-04: 2 mg via INTRAVENOUS
  Filled 2013-08-04: qty 1

## 2013-08-04 MED ORDER — CIPROFLOXACIN IN D5W 400 MG/200ML IV SOLN
400.0000 mg | Freq: Two times a day (BID) | INTRAVENOUS | Status: DC
  Administered 2013-08-04 – 2013-08-06 (×4): 400 mg via INTRAVENOUS
  Filled 2013-08-04 (×6): qty 200

## 2013-08-04 MED ORDER — TRAMADOL HCL 50 MG PO TABS: 50.0000 mg | ORAL_TABLET | Freq: Four times a day (QID) | ORAL | Status: DC | PRN

## 2013-08-04 MED ORDER — NITROFURANTOIN MONOHYD MACRO 100 MG PO CAPS: 100.0000 mg | ORAL_CAPSULE | Freq: Two times a day (BID) | ORAL | Status: DC

## 2013-08-04 MED ORDER — LORAZEPAM 2 MG/ML IJ SOLN
1.0000 mg | Freq: Once | INTRAMUSCULAR | Status: AC
Start: 2013-08-04 — End: 2013-08-04
  Administered 2013-08-04: 1 mg via INTRAVENOUS
  Filled 2013-08-04: qty 1

## 2013-08-04 MED ORDER — MORPHINE SULFATE 2 MG/ML IJ SOLN
2.0000 mg | Freq: Once | INTRAMUSCULAR | Status: AC
  Administered 2013-08-04: 2 mg via INTRAVENOUS
  Filled 2013-08-04: qty 1

## 2013-08-04 MED ORDER — SODIUM CHLORIDE 0.9 % IV BOLUS (SEPSIS)
1000.0000 mL | Freq: Once | INTRAVENOUS | Status: AC
  Administered 2013-08-04: 1000 mL via INTRAVENOUS

## 2013-08-04 MED ORDER — LIDOCAINE VISCOUS 2 % MT SOLN
15.0000 mL | Freq: Once | OROMUCOSAL | Status: AC
  Administered 2013-08-04: 15 mL via OROMUCOSAL
  Filled 2013-08-04: qty 15

## 2013-08-04 MED ORDER — MORPHINE SULFATE 2 MG/ML IJ SOLN: 2.0000 mg | INTRAMUSCULAR | Status: DC | PRN

## 2013-08-04 MED ORDER — METRONIDAZOLE IN NACL 5-0.79 MG/ML-% IV SOLN
500.0000 mg | Freq: Three times a day (TID) | INTRAVENOUS | Status: DC
  Administered 2013-08-04 – 2013-08-06 (×5): 500 mg via INTRAVENOUS
  Filled 2013-08-04 (×7): qty 100

## 2013-08-04 MED ORDER — ONDANSETRON 4 MG PO TBDP
8.0000 mg | ORAL_TABLET | Freq: Once | ORAL | Status: AC
  Administered 2013-08-04: 8 mg via ORAL
  Filled 2013-08-04: qty 2

## 2013-08-04 MED ORDER — ONDANSETRON HCL 4 MG/2ML IJ SOLN
4.0000 mg | Freq: Once | INTRAMUSCULAR | Status: AC
  Administered 2013-08-04: 4 mg via INTRAVENOUS
  Filled 2013-08-04: qty 2

## 2013-08-04 MED ORDER — FENTANYL CITRATE 0.05 MG/ML IJ SOLN
50.0000 ug | Freq: Once | INTRAMUSCULAR | Status: AC
  Administered 2013-08-04: 50 ug via NASAL

## 2013-08-04 MED ORDER — HEPARIN SODIUM (PORCINE) 5000 UNIT/ML IJ SOLN
5000.0000 [IU] | Freq: Three times a day (TID) | INTRAMUSCULAR | Status: DC
  Administered 2013-08-04 – 2013-08-06 (×5): 5000 [IU] via SUBCUTANEOUS
  Filled 2013-08-04 (×9): qty 1

## 2013-08-04 MED ORDER — IOHEXOL 300 MG/ML  SOLN
25.0000 mL | Freq: Once | INTRAMUSCULAR | Status: AC | PRN
  Administered 2013-08-04: 25 mL via ORAL

## 2013-08-04 MED ORDER — ONDANSETRON HCL 4 MG/2ML IJ SOLN
4.0000 mg | Freq: Four times a day (QID) | INTRAMUSCULAR | Status: DC
  Administered 2013-08-05 – 2013-08-06 (×6): 4 mg via INTRAVENOUS
  Filled 2013-08-04 (×6): qty 2

## 2013-08-04 MED ORDER — ONDANSETRON HCL 4 MG PO TABS
4.0000 mg | ORAL_TABLET | Freq: Four times a day (QID) | ORAL | Status: DC
  Filled 2013-08-04 (×8): qty 1

## 2013-08-04 MED ORDER — IOHEXOL 300 MG/ML  SOLN
80.0000 mL | Freq: Once | INTRAMUSCULAR | Status: AC | PRN
  Administered 2013-08-04: 80 mL via INTRAVENOUS

## 2013-08-04 MED ORDER — FENTANYL CITRATE 0.05 MG/ML IJ SOLN
INTRAMUSCULAR | Status: DC
Start: 2013-08-04 — End: 2013-08-04
  Filled 2013-08-04: qty 2

## 2013-08-04 NOTE — Progress Notes (Signed)
ANTIBIOTIC CONSULT NOTE - INITIAL  Pharmacy Consult for cipro Indication: UTI, enteritis  Allergies  Allergen Reactions  . Oxycodone Nausea And Vomiting    Patient Measurements:   Adjusted Body Weight:   Vital Signs: Temp: 98.1 F (36.7 C) (07/08 1809) Temp src: Oral (07/08 1809) BP: 144/61 mmHg (07/08 2115) Pulse Rate: 91 (07/08 2115) Intake/Output from previous day:   Intake/Output from this shift:    Labs:  Recent Labs  08/04/13 0100 08/04/13 0159 08/04/13 2008  WBC 10.1  --  12.4*  HGB 11.7* 12.6 11.6*  PLT 279  --  261  CREATININE 1.03 1.00 0.93   The CrCl is unknown because both a height and weight (above a minimum accepted value) are required for this calculation. No results found for this basename: VANCOTROUGH, VANCOPEAK, VANCORANDOM, GENTTROUGH, GENTPEAK, GENTRANDOM, TOBRATROUGH, TOBRAPEAK, TOBRARND, AMIKACINPEAK, AMIKACINTROU, AMIKACIN,  in the last 72 hours   Microbiology: No results found for this or any previous visit (from the past 720 hour(s)).  Medical History: Past Medical History  Diagnosis Date  . Hypertension   . Multiple sclerosis 1972  . Self-catheterizes urinary bladder ~ 2012    "sphincter in bladder doesn't work properly due to my MS" (05/17/2012  . History of recurrent UTIs   . History of blood transfusion ~ 2012    "after multiple UTI's; got bladder infection; passed alot of blood" (05/05/2012)  . NTZGYFVC(944.9)     "several/wk" (05/17/2012)  . 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 Abdominal fluid collection 05/07/2012  . Abdominal wall fluid collections 06/15/2012  . Abdominal pain, RUQ (right upper quadrant) 06/15/2012  . Small bowel obstruction 06/17/2012  . Neuromuscular disorder     MS    Medications:  Anti-infectives   Start     Dose/Rate Route Frequency Ordered Stop   08/04/13 2200  metroNIDAZOLE (FLAGYL) IVPB 500 mg     500 mg 100 mL/hr over 60 Minutes Intravenous Every 8 hours 08/04/13 2149     08/04/13 2200  ciprofloxacin  (CIPRO) IVPB 400 mg     400 mg 200 mL/hr over 60 Minutes Intravenous Every 12 hours 08/04/13 2157       Assessment: 22 yof presented to the ED with abdominal pain. Was prescribed macrobid for a UTI earlier today. To start cipro + flagyl for coverage of UTI and enteritis. Pt is afebrile but WBC is elevated. Scr is WNL.   Cipro 7/8>> Flagyl 7/8>>  Goal of Therapy:  Eradication of infection  Plan:  1. Cipro 400mg  IV BID 2. F/u renal fxn, C&S, clinical status   Lyne Khurana, Rande Lawman 08/04/2013,9:57 PM

## 2013-08-04 NOTE — ED Notes (Signed)
Patient was discharged with all personal belongings.

## 2013-08-04 NOTE — ED Provider Notes (Signed)
CSN: [id number]     Arrival date & time 08/04/13  0039 History   First MD Initiated Contact with Patient 08/04/13 0126     Chief Complaint  Patient presents with  . Abdominal Pain     (Consider location/radiation/quality/duration/timing/severity/associated sxs/prior Treatment) Patient is a 58 y.o. female presenting with abdominal pain. The history is provided by the patient.  Abdominal Pain Pain location:  Suprapubic Pain quality: aching   Pain radiates to:  Does not radiate Pain severity:  Moderate Onset quality:  Gradual Timing:  Constant Progression:  Unchanged Chronicity:  New Context: not trauma   Relieved by:  Nothing Worsened by:  Nothing tried Ineffective treatments:  None tried Associated symptoms: no anorexia and no vomiting   Risk factors: no alcohol abuse and no recent hospitalization     Past Medical History  Diagnosis Date  . Hypertension   . Multiple sclerosis 1972  . Self-catheterizes urinary bladder ~ 2012    "sphincter in bladder doesn't work properly due to my MS" (05/17/2012  . History of recurrent UTIs   . History of blood transfusion ~ 2012    "after multiple UTI's; got bladder infection; passed alot of blood" (05/05/2012)  . MPNTIRWE(315.4)     "several/wk" (05/17/2012)  . 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 Abdominal fluid collection 05/07/2012  . Abdominal wall fluid collections 06/15/2012  . Abdominal pain, RUQ (right upper quadrant) 06/15/2012  . Small bowel obstruction 06/17/2012  . Neuromuscular disorder     MS   Past Surgical History  Procedure Laterality Date  . Vaginal hysterectomy  2011  . Wound debridement Bilateral 06/13/2012    Procedure: DEBRIDEMENT ABDOMINAL WOUND;  Surgeon: Stark Klein, MD;  Location: [place];  Service: General;  Laterality: Bilateral;  . Laparotomy N/A 06/17/2012    Procedure: EXPLORATORY LAPAROTOMY FOR  SMALL BOWEL RESECTION;  Surgeon: Harl Bowie, MD;  Location: [place];  Service: General;  Laterality: N/A;   Family  History  Problem Relation Age of Onset  . Heart disease Mother   . Congestive Heart Failure Mother   . Heart disease Father   . Stroke Father   . Heart disease Brother   . Heart attack Brother    History  Substance Use Topics  . Smoking status: Never Smoker   . Smokeless tobacco: Never Used  . Alcohol Use: No   OB History   Grav Para Term Preterm Abortions TAB SAB Ect Mult Living                 Review of Systems  Gastrointestinal: Positive for abdominal pain. Negative for vomiting and anorexia.  All other systems reviewed and are negative.     Allergies  Oxycodone  Home Medications   Prior to Admission medications   Medication Sig Start Date End Date Taking? Authorizing Provider  aspirin-acetaminophen-caffeine (EXCEDRIN MIGRAINE) [phone] MG per tablet Take 2 tablets by mouth every 6 (six) hours as needed for pain.   Yes Historical Provider, MD  Calcium Carbonate-Vitamin D (CALCIUM 600 + D PO) Take 1 tablet by mouth daily.   Yes Historical Provider, MD  ferrous sulfate 325 (65 FE) MG tablet Take 325 mg by mouth daily with breakfast.   Yes Historical Provider, MD  lisinopril (PRINIVIL,ZESTRIL) 10 MG tablet Take 10 mg by mouth daily. 06/02/12  Yes Historical Provider, MD  Misc Natural Products (OSTEO BI-FLEX ADV JOINT SHIELD PO) Take 1 tablet by mouth daily.   Yes Historical Provider, MD  Multiple Vitamin (MULTIVITAMIN) tablet Take  1 tablet by mouth daily.   Yes Historical Provider, MD  nitrofurantoin (MACRODANTIN) 50 MG capsule Take 50 mg by mouth at bedtime.   Yes Historical Provider, MD  oxybutynin (DITROPAN) 5 MG tablet Take 5 mg by mouth 3 (three) times daily.  06/02/12  Yes Historical Provider, MD   BP 147/71  Pulse 64  Temp(Src) 97.8 F (36.6 C) (Oral)  Resp 15  SpO2 98% Physical Exam  Constitutional: She is oriented to person, place, and time. She appears well-developed and well-nourished. No distress.  HENT:  Head: Normocephalic and atraumatic.   Mouth/Throat: Oropharynx is clear and moist.  Eyes: Conjunctivae are normal. Pupils are equal, round, and reactive to light.  Neck: Normal range of motion. Neck supple.  Cardiovascular: Normal rate, regular rhythm and intact distal pulses.   Pulmonary/Chest: Effort normal and breath sounds normal. She has no wheezes. She has no rales.  Abdominal: Soft. Bowel sounds are increased. There is no tenderness. There is no rebound and no guarding.  Musculoskeletal: Normal range of motion.  Neurological: She is alert and oriented to person, place, and time.  Skin: Skin is warm and dry.  Psychiatric: She has a normal mood and affect.    ED Course  Procedures (including critical care time) Labs Review Labs Reviewed  COMPREHENSIVE METABOLIC PANEL - Abnormal; Notable for the following:    Glucose, Bld 118 (*)    Total Bilirubin <0.2 (*)    GFR calc non Af Amer 58 (*)    GFR calc Af Amer 67 (*)    All other components within normal limits  CBC WITH DIFFERENTIAL - Abnormal; Notable for the following:    RBC 3.54 (*)    Hemoglobin 11.7 (*)    HCT 35.4 (*)    All other components within normal limits  URINALYSIS, ROUTINE W REFLEX MICROSCOPIC - Abnormal; Notable for the following:    Hgb urine dipstick SMALL (*)    Leukocytes, UA SMALL (*)    All other components within normal limits  URINE MICROSCOPIC-ADD ON - Abnormal; Notable for the following:    Casts HYALINE CASTS (*)    All other components within normal limits  I-STAT CHEM 8, ED - Abnormal; Notable for the following:    Glucose, Bld 128 (*)    All other components within normal limits    Imaging Review Ct Abdomen Pelvis W Contrast  08/04/2013   CLINICAL DATA:  Bilateral lower quadrant abdominal pain radiating to the back. History of multiple abdominal surgeries.  EXAM: CT ABDOMEN AND PELVIS WITH CONTRAST  TECHNIQUE: Multidetector CT imaging of the abdomen and pelvis was performed using the standard protocol following bolus  administration of intravenous contrast.  CONTRAST:  8mL OMNIPAQUE IOHEXOL 300 MG/ML  SOLN  COMPARISON:  06/17/2012  FINDINGS: Mild dependent changes in the lung bases.  The liver, spleen, gallbladder, pancreas, adrenal glands, kidneys, abdominal aorta, inferior vena cava, and retroperitoneal lymph nodes are unremarkable. Stomach, small bowel, and colon are not abnormally distended. Fluid is present in nondistended lower abdominal small bowel without wall thickening. This may be due to enteritis or localized ileus. Partial obstruction not entirely excluded. There appears to be an anastomosis in the terminal ileum. No free air in the abdomen. Scarring in the anterior abdominal wall consistent with postoperative change.  Pelvis: The appendix is normal. Small amount of free fluid in the pelvis. Fluid along the right pericolic gutter. Bladder wall is not thickened. No evidence of diverticulitis. No loculated pelvic fluid collections. No  significant pelvic lymphadenopathy. Compression deformities of lower thoracic and upper lumbar vertebrae with Schmorl's nodes. This is unchanged since prior study in likely indicates osteoporosis. Postoperative changes in the right lower quadrant small bowel. Nonspecific fluid filled nondilated distal small bowel without wall thickening. This could represent enteritis or localized ileus. Small amount of free fluid in the pelvis is likely physiologic or reactive.   Electronically Signed   By: Lucienne Capers M.D.   On: 08/04/2013 05:16   Dg Abd Acute W/chest  08/04/2013   CLINICAL DATA:  Left lower quadrant abdominal pain and nausea.  EXAM: ACUTE ABDOMEN SERIES (ABDOMEN 2 VIEW & CHEST 1 VIEW)  COMPARISON:  06/17/2012  FINDINGS: Normal heart size and pulmonary vascularity. Lungs are clear. No focal airspace disease or consolidation.  Abdomen: Borderline prominence of gas-filled pelvic small bowel. Appearance is nonspecific but could suggest early obstruction or localized ileus.  Stool-filled colon without distention. No free intra-abdominal air. Small air-fluid levels in the pelvic small bowel loops. No radiopaque stones. Mild thoracolumbar scoliosis.  IMPRESSION: No evidence of active pulmonary disease. Nonspecific gas-filled mildly dilated small bowel loops in the pelvis could indicate early obstruction or localized ileus.   Electronically Signed   By: Lucienne Capers M.D.   On: 08/04/2013 02:54     EKG Interpretation None      MDM   Final diagnoses:  None   Ileus/UTI: normal exam, vitals and WBC count.  Tolerating PO well.  Pain free. Will d/c with pain medication.  macrobid and close follow up.  Strict abdominal pain return precautions given.  Patient and husband verbalize understanding and agree to follow up   Adanna Zuckerman Alfonso Patten, MD 08/04/13 [phone]

## 2013-08-04 NOTE — ED Notes (Signed)
Patient is pain free at this time.

## 2013-08-04 NOTE — H&P (Addendum)
Hospitalist Admission History and Physical  Patient name: [name] Medical record number: [id number] Date of birth: 07-06-1955 Age: 58 y.o. Gender: female  Primary Care Provider: Tula Nakayama  Chief Complaint: ileus, UTI History of Present Illness:This is a 58 y.o. year old female with significant past medical history of abd pain s/p exlap and bowel resection 07/2012, HTN, MS  presenting with ileus, UTI. Pt was seen in ER earlier today for abd pain and vomiting. Had CT done concerning for ileus vs. Enteritis. Pt was given PR anti-emetics and discharged home. Also with UTI , prescribed bactrim. Pt states that she went home and sxs progressively worsened. Unable to keep any solids or liquids. Was only able to take 1 bactrim. Denies any fevers or chills. + vomiting. Emesis NBNB.  Re-presented to ER today afebrile, hemodynamically stable. WBC 10-->12.4. Hgb stable around 11.6. Cr around 1. Glu 121. F/u KUB pending. NGT placed at bedside.   Assessment and Plan: [name] is a 58 y.o. year old female presenting with ileus   Ileus: continue NGT. F/u KUB. Scheduled anti-emetics. Hydration. Start on cipro and flagyl given ? Enteritis in setting of UTI. Consider surgery consult if sxs fail to improve despite treatment.   UTI: urine culture. Cipro and flagyl .  HTN: Stable currently. Hold lisinopril in the interim.   FEN/GI: NPO. NGT  Prophylaxis: sub q heparin  Disposition: pending further evaluation  Code Status:Full Code    Patient Active Problem List   Diagnosis Date Noted  . Ileus 08/04/2013  . Small bowel obstruction 06/17/2012  . Abdominal wall fluid collections 06/15/2012  . Abdominal pain, RUQ (right upper quadrant) 06/15/2012  . UTI (urinary tract infection) 06/03/2012  . Multiple sclerosis 06/03/2012  . Abdominal pain 06/01/2012  . Abdominal fluid collection 05/07/2012  . LUQ abdominal pain 05/05/2012   Past Medical History: Past Medical History   Diagnosis Date  . Hypertension   . Multiple sclerosis 1972  . Self-catheterizes urinary bladder ~ 2012    "sphincter in bladder doesn't work properly due to my MS" (05/17/2012  . History of recurrent UTIs   . History of blood transfusion ~ 2012    "after multiple UTI's; got bladder infection; passed alot of blood" (05/05/2012)  . GEXBMWUX(324.4)     "several/wk" (05/17/2012)  . 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 Abdominal fluid collection 05/07/2012  . Abdominal wall fluid collections 06/15/2012  . Abdominal pain, RUQ (right upper quadrant) 06/15/2012  . Small bowel obstruction 06/17/2012  . Neuromuscular disorder     MS    Past Surgical History: Past Surgical History  Procedure Laterality Date  . Vaginal hysterectomy  2011  . Wound debridement Bilateral 06/13/2012    Procedure: DEBRIDEMENT ABDOMINAL WOUND;  Surgeon: Stark Klein, MD;  Location: [place];  Service: General;  Laterality: Bilateral;  . Laparotomy N/A 06/17/2012    Procedure: EXPLORATORY LAPAROTOMY FOR  SMALL BOWEL RESECTION;  Surgeon: Harl Bowie, MD;  Location: [place];  Service: General;  Laterality: N/A;    Social History: History   Social History  . Marital Status: Married    Spouse Name: N/A    Number of Children: N/A  . Years of Education: N/A   Social History Main Topics  . Smoking status: Never Smoker   . Smokeless tobacco: Never Used  . Alcohol Use: No  . Drug Use: No  . Sexual Activity: Yes   Other Topics Concern  . None   Social History Narrative  . None  Family History: Family History  Problem Relation Age of Onset  . Heart disease Mother   . Congestive Heart Failure Mother   . Heart disease Father   . Stroke Father   . Heart disease Brother   . Heart attack Brother     Allergies: Allergies  Allergen Reactions  . Oxycodone Nausea And Vomiting    Current Facility-Administered Medications  Medication Dose Route Frequency Provider Last Rate Last Dose  . dextrose 5 % and 0.45 % NaCl  with KCl 20 mEq/L infusion   Intravenous Continuous Shanda Howells, MD      . heparin injection 5,000 Units  5,000 Units Subcutaneous 3 times per day Shanda Howells, MD      . Derrill Memo ON 08/05/2013] ondansetron [facility]) tablet 4 mg  4 mg Oral 4 times per day Shanda Howells, MD       Or  . Derrill Memo ON 08/05/2013] ondansetron (ZOFRAN) injection 4 mg  4 mg Intravenous 4 times per day Shanda Howells, MD       Current Outpatient Prescriptions  Medication Sig Dispense Refill  . aspirin-acetaminophen-caffeine (EXCEDRIN MIGRAINE) [phone] MG per tablet Take 2 tablets by mouth every 6 (six) hours as needed for pain.      . Calcium Carbonate-Vitamin D (CALCIUM 600 + D PO) Take 1 tablet by mouth daily.      . ferrous sulfate 325 (65 FE) MG tablet Take 325 mg by mouth daily with breakfast.      . lisinopril (PRINIVIL,ZESTRIL) 10 MG tablet Take 10 mg by mouth daily.      . Misc Natural Products (OSTEO BI-FLEX ADV JOINT SHIELD PO) Take 1 tablet by mouth daily.      . Multiple Vitamin (MULTIVITAMIN) tablet Take 1 tablet by mouth daily.      . nitrofurantoin, macrocrystal-monohydrate, (MACROBID) 100 MG capsule Take 1 capsule (100 mg total) by mouth 2 (two) times daily. X 7 days  14 capsule  0  . oxybutynin (DITROPAN) 5 MG tablet Take 5 mg by mouth 3 (three) times daily.       . traMADol (ULTRAM) 50 MG tablet Take 1 tablet (50 mg total) by mouth every 6 (six) hours as needed.  15 tablet  0   Review Of Systems: 12 point ROS negative except as noted above in HPI.  Physical Exam: Filed Vitals:   08/04/13 2000  BP: 125/66  Pulse: 85  Temp:   Resp:     General: alert and cooperative HEENT: PERRLA and extra ocular movement intact Heart: S1, S2 normal, no murmur, rub or gallop, regular rate and rhythm Lungs: clear to auscultation, no wheezes or rales and unlabored breathing Abdomen: + high pitched bowel sounds, mild generalized abd TTP  Extremities: extremities normal, atraumatic, no cyanosis or edema Skin:no  rashes, no ecchymoses Neurology: normal without focal findings  Labs and Imaging: Lab Results  Component Value Date/Time   NA 141 08/04/2013  8:08 PM   K 4.6 08/04/2013  8:08 PM   CL 102 08/04/2013  8:08 PM   CO2 26 08/04/2013  8:08 PM   BUN 17 08/04/2013  8:08 PM   CREATININE 0.93 08/04/2013  8:08 PM   GLUCOSE 121* 08/04/2013  8:08 PM   Lab Results  Component Value Date   WBC 12.4* 08/04/2013   HGB 11.6* 08/04/2013   HCT 34.9* 08/04/2013   MCV 98.3 08/04/2013   PLT 261 08/04/2013   Urinalysis    Component Value Date/Time   COLORURINE YELLOW 08/04/2013 0201  APPEARANCEUR CLEAR 08/04/2013 0201   LABSPEC 1.020 08/04/2013 0201   PHURINE 7.0 08/04/2013 0201   GLUCOSEU NEGATIVE 08/04/2013 0201   HGBUR SMALL* 08/04/2013 0201   BILIRUBINUR NEGATIVE 08/04/2013 0201   KETONESUR NEGATIVE 08/04/2013 0201   PROTEINUR NEGATIVE 08/04/2013 0201   UROBILINOGEN 0.2 08/04/2013 0201   NITRITE NEGATIVE 08/04/2013 0201   LEUKOCYTESUR SMALL* 08/04/2013 0201       Ct Abdomen Pelvis W Contrast  08/04/2013   CLINICAL DATA:  Bilateral lower quadrant abdominal pain radiating to the back. History of multiple abdominal surgeries.  EXAM: CT ABDOMEN AND PELVIS WITH CONTRAST  TECHNIQUE: Multidetector CT imaging of the abdomen and pelvis was performed using the standard protocol following bolus administration of intravenous contrast.  CONTRAST:  18mL OMNIPAQUE IOHEXOL 300 MG/ML  SOLN  COMPARISON:  06/17/2012  FINDINGS: Mild dependent changes in the lung bases.  The liver, spleen, gallbladder, pancreas, adrenal glands, kidneys, abdominal aorta, inferior vena cava, and retroperitoneal lymph nodes are unremarkable. Stomach, small bowel, and colon are not abnormally distended. Fluid is present in nondistended lower abdominal small bowel without wall thickening. This may be due to enteritis or localized ileus. Partial obstruction not entirely excluded. There appears to be an anastomosis in the terminal ileum. No free air in the abdomen. Scarring in the  anterior abdominal wall consistent with postoperative change.  Pelvis: The appendix is normal. Small amount of free fluid in the pelvis. Fluid along the right pericolic gutter. Bladder wall is not thickened. No evidence of diverticulitis. No loculated pelvic fluid collections. No significant pelvic lymphadenopathy. Compression deformities of lower thoracic and upper lumbar vertebrae with Schmorl's nodes. This is unchanged since prior study in likely indicates osteoporosis. Postoperative changes in the right lower quadrant small bowel. Nonspecific fluid filled nondilated distal small bowel without wall thickening. This could represent enteritis or localized ileus. Small amount of free fluid in the pelvis is likely physiologic or reactive.   Electronically Signed   By: Lucienne Capers M.D.   On: 08/04/2013 05:16   Dg Abd Acute W/chest  08/04/2013   CLINICAL DATA:  Left lower quadrant abdominal pain and nausea.  EXAM: ACUTE ABDOMEN SERIES (ABDOMEN 2 VIEW & CHEST 1 VIEW)  COMPARISON:  06/17/2012  FINDINGS: Normal heart size and pulmonary vascularity. Lungs are clear. No focal airspace disease or consolidation.  Abdomen: Borderline prominence of gas-filled pelvic small bowel. Appearance is nonspecific but could suggest early obstruction or localized ileus. Stool-filled colon without distention. No free intra-abdominal air. Small air-fluid levels in the pelvic small bowel loops. No radiopaque stones. Mild thoracolumbar scoliosis.  IMPRESSION: No evidence of active pulmonary disease. Nonspecific gas-filled mildly dilated small bowel loops in the pelvis could indicate early obstruction or localized ileus.   Electronically Signed   By: Lucienne Capers M.D.   On: 08/04/2013 02:54           Shanda Howells MD  Pager: [phone]

## 2013-08-04 NOTE — ED Notes (Signed)
Presents with bilateral lower quadrant abdominal pain with radiation to lower back began at 8:30 pm last night. Pain is described as severe and sharp and constant associated with nausea. Hx of multiple abdominal surgeries. Last BM yesterday normal.  MS patient who self cath's at home, has HX of frequent UTIs.

## 2013-08-04 NOTE — ED Provider Notes (Signed)
CSN: [id number]     Arrival date & time 08/04/13  1803 History   First MD Initiated Contact with Patient 08/04/13 1919     Chief Complaint  Patient presents with  . Abdominal Pain     (Consider location/radiation/quality/duration/timing/severity/associated sxs/prior Treatment) The history is provided by the patient and medical records. No language interpreter was used.    [name] is a 58 y.o. female  with a hx of HTN, MS, self cath with recurrent UTIs, SBO presents to the Emergency Department complaining of gradual, persistent, progressively worsening nausea and vomiting onset last night. Associated symptoms include mid abd pain. Emesis is NBNB.  Pt reports she was seen this AM for the same and dx with an ileus.  Pt with a hx of SOB in May 2014 that required surgical intervention and removal of 12 inches of the small bowel Ninfa Linden).  Pt reports using phenergan suppositories at home without relief.  No aggravating or alleviating factors.  Pt denies fever, headache, neck pain, chest pain, SOB, diarrhea, weakness, dizziness.  Pt reports last BM Tuesday morning (36 hours ago) and she is not passing gas.      Past Medical History  Diagnosis Date  . Hypertension   . Multiple sclerosis 1972  . Self-catheterizes urinary bladder ~ 2012    "sphincter in bladder doesn't work properly due to my MS" (05/17/2012  . History of recurrent UTIs   . History of blood transfusion ~ 2012    "after multiple UTI's; got bladder infection; passed alot of blood" (05/05/2012)  . TFTDDUKG(254.2)     "several/wk" (05/17/2012)  . 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 Abdominal fluid collection 05/07/2012  . Abdominal wall fluid collections 06/15/2012  . Abdominal pain, RUQ (right upper quadrant) 06/15/2012  . Small bowel obstruction 06/17/2012  . Neuromuscular disorder     MS   Past Surgical History  Procedure Laterality Date  . Vaginal hysterectomy  2011  . Wound debridement Bilateral 06/13/2012    Procedure:  DEBRIDEMENT ABDOMINAL WOUND;  Surgeon: Stark Klein, MD;  Location: [place];  Service: General;  Laterality: Bilateral;  . Laparotomy N/A 06/17/2012    Procedure: EXPLORATORY LAPAROTOMY FOR  SMALL BOWEL RESECTION;  Surgeon: Harl Bowie, MD;  Location: [place];  Service: General;  Laterality: N/A;   Family History  Problem Relation Age of Onset  . Heart disease Mother   . Congestive Heart Failure Mother   . Heart disease Father   . Stroke Father   . Heart disease Brother   . Heart attack Brother    History  Substance Use Topics  . Smoking status: Never Smoker   . Smokeless tobacco: Never Used  . Alcohol Use: No   OB History   Grav Para Term Preterm Abortions TAB SAB Ect Mult Living                 Review of Systems  Constitutional: Negative for fever, diaphoresis, appetite change, fatigue and unexpected weight change.  HENT: Negative for mouth sores and trouble swallowing.   Respiratory: Negative for cough, chest tightness, shortness of breath, wheezing and stridor.   Cardiovascular: Negative for chest pain and palpitations.  Gastrointestinal: Positive for nausea, vomiting and abdominal pain. Negative for diarrhea, constipation, blood in stool, abdominal distention and rectal pain.  Genitourinary: Negative for dysuria, urgency, frequency, hematuria, flank pain and difficulty urinating.  Musculoskeletal: Negative for back pain, neck pain and neck stiffness.  Skin: Negative for rash.  Neurological: Negative for weakness.  Hematological: Negative for adenopathy.  Psychiatric/Behavioral: Negative for confusion.  All other systems reviewed and are negative.     Allergies  Oxycodone  Home Medications   Prior to Admission medications   Medication Sig Start Date End Date Taking? Authorizing Provider  aspirin-acetaminophen-caffeine (EXCEDRIN MIGRAINE) [phone] MG per tablet Take 2 tablets by mouth every 6 (six) hours as needed for pain.   Yes Historical Provider, MD   Calcium Carbonate-Vitamin D (CALCIUM 600 + D PO) Take 1 tablet by mouth daily.   Yes Historical Provider, MD  ferrous sulfate 325 (65 FE) MG tablet Take 325 mg by mouth daily with breakfast.   Yes Historical Provider, MD  lisinopril (PRINIVIL,ZESTRIL) 10 MG tablet Take 10 mg by mouth daily. 06/02/12  Yes Historical Provider, MD  Misc Natural Products (OSTEO BI-FLEX ADV JOINT SHIELD PO) Take 1 tablet by mouth daily.   Yes Historical Provider, MD  Multiple Vitamin (MULTIVITAMIN) tablet Take 1 tablet by mouth daily.   Yes Historical Provider, MD  nitrofurantoin, macrocrystal-monohydrate, (MACROBID) 100 MG capsule Take 1 capsule (100 mg total) by mouth 2 (two) times daily. X 7 days 08/04/13  Yes April K Palumbo-Rasch, MD  oxybutynin (DITROPAN) 5 MG tablet Take 5 mg by mouth 3 (three) times daily.  06/02/12  Yes Historical Provider, MD  traMADol (ULTRAM) 50 MG tablet Take 1 tablet (50 mg total) by mouth every 6 (six) hours as needed. 08/04/13  Yes April K Palumbo-Rasch, MD   BP 144/61  Pulse 91  Temp(Src) 98.1 F (36.7 C) (Oral)  Resp 18  SpO2 96% Physical Exam  Nursing note and vitals reviewed. Constitutional: She appears well-developed and well-nourished.  HENT:  Head: Normocephalic and atraumatic.  Mouth/Throat: Oropharynx is clear and moist.  Eyes: Conjunctivae are normal. No scleral icterus.  Cardiovascular: Normal rate, regular rhythm, normal heart sounds and intact distal pulses.   No murmur heard. Pulmonary/Chest: Effort normal and breath sounds normal. No respiratory distress. She has no wheezes.  Abdominal: Soft. Bowel sounds are normal. She exhibits distension. She exhibits no mass. There is tenderness. There is guarding. There is no rebound and no CVA tenderness.  Mildly distended and firm abd without peritoneal signs; mild guarding  Neurological: She is alert.  Skin: Skin is warm and dry.  Psychiatric: She has a normal mood and affect.    ED Course  Procedures (including critical  care time) Labs Review Labs Reviewed  CBC - Abnormal; Notable for the following:    WBC 12.4 (*)    RBC 3.55 (*)    Hemoglobin 11.6 (*)    HCT 34.9 (*)    All other components within normal limits  BASIC METABOLIC PANEL - Abnormal; Notable for the following:    Glucose, Bld 121 (*)    GFR calc non Af Amer 65 (*)    GFR calc Af Amer 76 (*)    All other components within normal limits  URINE CULTURE  CBC  CREATININE, SERUM  COMPREHENSIVE METABOLIC PANEL  CBC WITH DIFFERENTIAL    Imaging Review Ct Abdomen Pelvis W Contrast  08/04/2013   CLINICAL DATA:  Bilateral lower quadrant abdominal pain radiating to the back. History of multiple abdominal surgeries.  EXAM: CT ABDOMEN AND PELVIS WITH CONTRAST  TECHNIQUE: Multidetector CT imaging of the abdomen and pelvis was performed using the standard protocol following bolus administration of intravenous contrast.  CONTRAST:  38mL OMNIPAQUE IOHEXOL 300 MG/ML  SOLN  COMPARISON:  06/17/2012  FINDINGS: Mild dependent changes in the lung bases.  The liver, spleen, gallbladder, pancreas, adrenal glands, kidneys, abdominal aorta, inferior vena cava, and retroperitoneal lymph nodes are unremarkable. Stomach, small bowel, and colon are not abnormally distended. Fluid is present in nondistended lower abdominal small bowel without wall thickening. This may be due to enteritis or localized ileus. Partial obstruction not entirely excluded. There appears to be an anastomosis in the terminal ileum. No free air in the abdomen. Scarring in the anterior abdominal wall consistent with postoperative change.  Pelvis: The appendix is normal. Small amount of free fluid in the pelvis. Fluid along the right pericolic gutter. Bladder wall is not thickened. No evidence of diverticulitis. No loculated pelvic fluid collections. No significant pelvic lymphadenopathy. Compression deformities of lower thoracic and upper lumbar vertebrae with Schmorl's nodes. This is unchanged since prior  study in likely indicates osteoporosis. Postoperative changes in the right lower quadrant small bowel. Nonspecific fluid filled nondilated distal small bowel without wall thickening. This could represent enteritis or localized ileus. Small amount of free fluid in the pelvis is likely physiologic or reactive.   Electronically Signed   By: Lucienne Capers M.D.   On: 08/04/2013 05:16   Dg Abd Acute W/chest  08/04/2013   CLINICAL DATA:  Left lower quadrant abdominal pain and nausea.  EXAM: ACUTE ABDOMEN SERIES (ABDOMEN 2 VIEW & CHEST 1 VIEW)  COMPARISON:  06/17/2012  FINDINGS: Normal heart size and pulmonary vascularity. Lungs are clear. No focal airspace disease or consolidation.  Abdomen: Borderline prominence of gas-filled pelvic small bowel. Appearance is nonspecific but could suggest early obstruction or localized ileus. Stool-filled colon without distention. No free intra-abdominal air. Small air-fluid levels in the pelvic small bowel loops. No radiopaque stones. Mild thoracolumbar scoliosis.  IMPRESSION: No evidence of active pulmonary disease. Nonspecific gas-filled mildly dilated small bowel loops in the pelvis could indicate early obstruction or localized ileus.   Electronically Signed   By: Lucienne Capers M.D.   On: 08/04/2013 02:54     EKG Interpretation None      MDM   Final diagnoses:  Ileus  Intractable vomiting with nausea, vomiting of unspecified type   MONCIA ANNAS presents with persistent N/V after being d/c with ileus this AM.  Patient alert and oriented, nontoxic and nonseptic appearing. Abdomen is slightly distended and tender to palpation without peritoneal signs.  Will place NG tube and proceed with medical admission.  BP 125/66  Pulse 85  Temp(Src) 98.1 F (36.7 C) (Oral)  Resp 18  SpO2 97%  9:45 PM Pt to be admitted by [facility]; Dr. Lucia Gaskins requests plain films to rule out perforation. NG tube placed without difficulty.  VSS.  Pt with increasing leukocytosis, but no  electrolyte abnormalities.  BP 144/61  Pulse 91  Temp(Src) 98.1 F (36.7 C) (Oral)  Resp 18  SpO2 96%   Abigail Butts, PA-C 08/04/13 2148

## 2013-08-04 NOTE — ED Notes (Signed)
Pt seen earlier this am for abdominal pain. Diagnosed with an ileus. Unable to keep fluids down. Suppository not helping. Abdominal pain across middle of abdomen.

## 2013-08-05 ENCOUNTER — Observation Stay (HOSPITAL_COMMUNITY): Payer: Medicare Other

## 2013-08-05 DIAGNOSIS — N390 Urinary tract infection, site not specified: Secondary | ICD-10-CM | POA: Diagnosis present

## 2013-08-05 DIAGNOSIS — K560 Paralytic ileus: Secondary | ICD-10-CM | POA: Diagnosis present

## 2013-08-05 DIAGNOSIS — R112 Nausea with vomiting, unspecified: Secondary | ICD-10-CM

## 2013-08-05 DIAGNOSIS — I10 Essential (primary) hypertension: Secondary | ICD-10-CM | POA: Diagnosis present

## 2013-08-05 DIAGNOSIS — Z823 Family history of stroke: Secondary | ICD-10-CM | POA: Diagnosis not present

## 2013-08-05 DIAGNOSIS — Z885 Allergy status to narcotic agent status: Secondary | ICD-10-CM | POA: Diagnosis not present

## 2013-08-05 DIAGNOSIS — G35 Multiple sclerosis: Secondary | ICD-10-CM | POA: Diagnosis present

## 2013-08-05 DIAGNOSIS — K5289 Other specified noninfective gastroenteritis and colitis: Secondary | ICD-10-CM | POA: Diagnosis present

## 2013-08-05 DIAGNOSIS — Z8249 Family history of ischemic heart disease and other diseases of the circulatory system: Secondary | ICD-10-CM | POA: Diagnosis not present

## 2013-08-05 DIAGNOSIS — R109 Unspecified abdominal pain: Secondary | ICD-10-CM | POA: Diagnosis present

## 2013-08-05 LAB — CBC WITH DIFFERENTIAL/PLATELET
BASOS PCT: 0 % (ref 0–1)
Basophils Absolute: 0 10*3/uL (ref 0.0–0.1)
EOS PCT: 1 % (ref 0–5)
Eosinophils Absolute: 0.1 10*3/uL (ref 0.0–0.7)
HEMATOCRIT: 32.4 % — AB (ref 36.0–46.0)
Hemoglobin: 10.7 g/dL — ABNORMAL LOW (ref 12.0–15.0)
Lymphocytes Relative: 21 % (ref 12–46)
Lymphs Abs: 1.8 10*3/uL (ref 0.7–4.0)
MCH: 33.1 pg (ref 26.0–34.0)
MCHC: 33 g/dL (ref 30.0–36.0)
MCV: 100.3 fL — ABNORMAL HIGH (ref 78.0–100.0)
MONO ABS: 0.8 10*3/uL (ref 0.1–1.0)
Monocytes Relative: 9 % (ref 3–12)
NEUTROS ABS: 6.1 10*3/uL (ref 1.7–7.7)
Neutrophils Relative %: 69 % (ref 43–77)
Platelets: 244 10*3/uL (ref 150–400)
RBC: 3.23 MIL/uL — ABNORMAL LOW (ref 3.87–5.11)
RDW: 13.4 % (ref 11.5–15.5)
WBC: 8.7 10*3/uL (ref 4.0–10.5)

## 2013-08-05 LAB — COMPREHENSIVE METABOLIC PANEL
ALK PHOS: 40 U/L (ref 39–117)
ALT: 13 U/L (ref 0–35)
AST: 17 U/L (ref 0–37)
Albumin: 3.4 g/dL — ABNORMAL LOW (ref 3.5–5.2)
Anion gap: 11 (ref 5–15)
BILIRUBIN TOTAL: 0.2 mg/dL — AB (ref 0.3–1.2)
BUN: 17 mg/dL (ref 6–23)
CHLORIDE: 105 meq/L (ref 96–112)
CO2: 26 meq/L (ref 19–32)
Calcium: 8.5 mg/dL (ref 8.4–10.5)
Creatinine, Ser: 0.87 mg/dL (ref 0.50–1.10)
GFR calc non Af Amer: 71 mL/min — ABNORMAL LOW (ref >90)
GFR, EST AFRICAN AMERICAN: 82 mL/min — AB (ref >90)
GLUCOSE: 126 mg/dL — AB (ref 70–99)
POTASSIUM: 4.6 meq/L (ref 3.7–5.3)
Sodium: 142 mEq/L (ref 137–147)
Total Protein: 5.8 g/dL — ABNORMAL LOW (ref 6.0–8.3)

## 2013-08-05 MED ORDER — KCL IN DEXTROSE-NACL 20-5-0.45 MEQ/L-%-% IV SOLN
INTRAVENOUS | Status: AC
  Administered 2013-08-05 – 2013-08-06 (×2): via INTRAVENOUS
  Filled 2013-08-05 (×2): qty 1000

## 2013-08-05 MED ORDER — DOCUSATE SODIUM 100 MG PO CAPS
200.0000 mg | ORAL_CAPSULE | Freq: Two times a day (BID) | ORAL | Status: DC
  Administered 2013-08-05 – 2013-08-06 (×3): 200 mg via ORAL
  Filled 2013-08-05 (×4): qty 2

## 2013-08-05 MED ORDER — IBUPROFEN 400 MG PO TABS
400.0000 mg | ORAL_TABLET | Freq: Four times a day (QID) | ORAL | Status: DC | PRN
  Administered 2013-08-05 (×3): 400 mg via ORAL
  Filled 2013-08-05 (×3): qty 1

## 2013-08-05 NOTE — Progress Notes (Signed)
Patient Demographics  [name], is a 58 y.o. female, DOB - July 31, 1955, ZDG:[id number]  Admit date - 08/04/2013   Admitting Physician Shanda Howells, MD  Outpatient Primary MD for the patient is Tula Nakayama  LOS - 1   Chief Complaint  Patient presents with  . Abdominal Pain        Subjective:   [name] today has, No headache, No chest pain, minimal to no abdominal pain - No Nausea, No new weakness tingling or numbness, No Cough - SOB. Is now passing flatus    Assessment & Plan    1. Abdominal pain and nausea most likely due to mild antritis with ileus, history of SBO requiring surgery in the past. She is on Cipro Flagyl along with IV fluids and bowel rest, clinically much improved, now passing flatus, pain and nausea almost completely resolved, repeat x-ray appears stable, and she has minimal drainage, we'll clamp NG, challenge with clear liquid diet, requested surgery to evaluate. Continue supportive care for now.    2. Hypertension stable. Holding lisinopril.   3. UTI. On Cipro. Monitor cultures.      Code Status: Full  Family Communication: Husband bedside  Disposition Plan: Home   Procedures CT scan abdomen pelvis   Consults  CCS  Medications  Scheduled Meds: . ciprofloxacin  400 mg Intravenous Q12H  . docusate sodium  200 mg Oral BID  . heparin  5,000 Units Subcutaneous 3 times per day  . metronidazole  500 mg Intravenous Q8H  . ondansetron  4 mg Oral 4 times per day   Or  . ondansetron (ZOFRAN) IV  4 mg Intravenous 4 times per day   Continuous Infusions: . dextrose 5 % and 0.45 % NaCl with KCl 20 mEq/L     PRN Meds:.  DVT Prophylaxis    Heparin    Lab Results  Component Value Date   PLT 244 08/05/2013    Antibiotics      Anti-infectives   Start     Dose/Rate Route Frequency Ordered Stop   08/04/13 2200  metroNIDAZOLE (FLAGYL) IVPB 500 mg     500 mg 100 mL/hr over 60 Minutes Intravenous Every 8 hours 08/04/13 2149     08/04/13 2200  ciprofloxacin (CIPRO) IVPB 400 mg     400 mg 200 mL/hr over 60 Minutes Intravenous Every 12 hours 08/04/13 2157            Objective:   Filed Vitals:   08/04/13 2200 08/04/13 2215 08/04/13 2230 08/05/13 0558  BP: 132/58 140/65 135/69 142/66  Pulse: 84 93 94 78  Temp:   98.3 F (36.8 C) 98.5 F (36.9 C)  TempSrc:   Oral Oral  Resp:   18 18  Height:   5\' 2"  (1.575 m)   Weight:   58.2 kg (128 lb 4.9 oz)   SpO2: 95% 97% 98% 100%    Wt Readings from Last 3 Encounters:  08/04/13 58.2 kg (128 lb 4.9 oz)  09/08/12 54.522 kg (120 lb 3.2 oz)  07/29/12 52.527 kg (115 lb 12.8 oz)     Intake/Output Summary (Last 24 hours) at 08/05/13 1024 Last data filed at 08/05/13 0927  Gross per 24 hour  Intake 796.67 ml  Output  500 ml  Net 296.67 ml     Physical Exam  Awake Alert, Oriented X 3, No new F.N deficits, Normal affect [place].AT,PERRAL Supple Neck,No JVD, No cervical lymphadenopathy appriciated.  Symmetrical Chest wall movement, Good air movement bilaterally, CTAB RRR,No Gallops,Rubs or new Murmurs, No Parasternal Heave +ve B.Sounds, Abd Soft, No tenderness, No organomegaly appriciated, No rebound - guarding or rigidity. NG in place No Cyanosis, Clubbing or edema, No new Rash or bruise      Data Review   Micro Results No results found for this or any previous visit (from the past 240 hour(s)).  Radiology Reports Ct Abdomen Pelvis W Contrast  08/04/2013   CLINICAL DATA:  Bilateral lower quadrant abdominal pain radiating to the back. History of multiple abdominal surgeries.  EXAM: CT ABDOMEN AND PELVIS WITH CONTRAST  TECHNIQUE: Multidetector CT imaging of the abdomen and pelvis was performed using the standard protocol following bolus administration of  intravenous contrast.  CONTRAST:  79mL OMNIPAQUE IOHEXOL 300 MG/ML  SOLN  COMPARISON:  06/17/2012  FINDINGS: Mild dependent changes in the lung bases.  The liver, spleen, gallbladder, pancreas, adrenal glands, kidneys, abdominal aorta, inferior vena cava, and retroperitoneal lymph nodes are unremarkable. Stomach, small bowel, and colon are not abnormally distended. Fluid is present in nondistended lower abdominal small bowel without wall thickening. This may be due to enteritis or localized ileus. Partial obstruction not entirely excluded. There appears to be an anastomosis in the terminal ileum. No free air in the abdomen. Scarring in the anterior abdominal wall consistent with postoperative change.  Pelvis: The appendix is normal. Small amount of free fluid in the pelvis. Fluid along the right pericolic gutter. Bladder wall is not thickened. No evidence of diverticulitis. No loculated pelvic fluid collections. No significant pelvic lymphadenopathy. Compression deformities of lower thoracic and upper lumbar vertebrae with Schmorl's nodes. This is unchanged since prior study in likely indicates osteoporosis. Postoperative changes in the right lower quadrant small bowel. Nonspecific fluid filled nondilated distal small bowel without wall thickening. This could represent enteritis or localized ileus. Small amount of free fluid in the pelvis is likely physiologic or reactive.   Electronically Signed   By: Lucienne Capers M.D.   On: 08/04/2013 05:16      Dg Abd Portable 1v  08/05/2013   CLINICAL DATA:  Abdominal pain, constipation  EXAM: PORTABLE ABDOMEN - 1 VIEW  COMPARISON:  Abdomen films of 08/04/2013  FINDINGS: A portable supine film of the abdomen shows some contrast remaining within the nondistended right colon. No radiographic evidence of constipation is seen. No bowel obstruction is noted. An NG tube is noted with the tip near the expected GE junction.  IMPRESSION: No evidence of constipation  radiographically. No obstruction. NG tube tip near expected GE junction.   Electronically Signed   By: Ivar Drape M.D.   On: 08/05/2013 08:56    CBC  Recent Labs Lab 08/04/13 0100 08/04/13 0159 08/04/13 2008 08/04/13 2154 08/05/13 0307  WBC 10.1  --  12.4* 11.0* 8.7  HGB 11.7* 12.6 11.6* 11.5* 10.7*  HCT 35.4* 37.0 34.9* 34.5* 32.4*  PLT 279  --  261 271 244  MCV 100.0  --  98.3 98.6 100.3*  MCH 33.1  --  32.7 32.9 33.1  MCHC 33.1  --  33.2 33.3 33.0  RDW 13.1  --  13.1 13.3 13.4  LYMPHSABS 2.6  --   --   --  1.8  MONOABS 0.6  --   --   --  0.8  EOSABS 0.1  --   --   --  0.1  BASOSABS 0.0  --   --   --  0.0    Chemistries   Recent Labs Lab 08/04/13 0100 08/04/13 0159 08/04/13 2008 08/04/13 2154 08/05/13 0307  NA 143 142 141  --  142  K 4.2 3.8 4.6  --  4.6  CL 102 103 102  --  105  CO2 26  --  26  --  26  GLUCOSE 118* 128* 121*  --  126*  BUN 18 17 17   --  17  CREATININE 1.03 1.00 0.93 0.86 0.87  CALCIUM 10.0  --  9.0  --  8.5  AST 27  --   --   --  17  ALT 20  --   --   --  13  ALKPHOS 54  --   --   --  40  BILITOT <0.2*  --   --   --  0.2*   ------------------------------------------------------------------------------------------------------------------ estimated creatinine clearance is 54.4 ml/min (by C-G formula based on Cr of 0.87). ------------------------------------------------------------------------------------------------------------------ No results found for this basename: HGBA1C,  in the last 72 hours ------------------------------------------------------------------------------------------------------------------ No results found for this basename: CHOL, HDL, LDLCALC, TRIG, CHOLHDL, LDLDIRECT,  in the last 72 hours ------------------------------------------------------------------------------------------------------------------ No results found for this basename: TSH, T4TOTAL, FREET3, T3FREE, THYROIDAB,  in the last 72  hours ------------------------------------------------------------------------------------------------------------------ No results found for this basename: VITAMINB12, FOLATE, FERRITIN, TIBC, IRON, RETICCTPCT,  in the last 72 hours  Coagulation profile No results found for this basename: INR, PROTIME,  in the last 168 hours  No results found for this basename: DDIMER,  in the last 72 hours  Cardiac Enzymes No results found for this basename: CK, CKMB, TROPONINI, MYOGLOBIN,  in the last 168 hours ------------------------------------------------------------------------------------------------------------------ No components found with this basename: POCBNP,      Time Spent in minutes  35   Juliett Eastburn K M.D on 08/05/2013 at 10:24 AM  Between 7am to 7pm - Pager - [phone]  After 7pm go to [email or web address] - password [email or web address]  And look for the night coverage person covering for me after hours  [facility] Office  [phone]   **Disclaimer: This note may have been dictated with voice recognition software. Similar sounding words can inadvertently be transcribed and this note may contain transcription errors which may not have been corrected upon publication of note.**

## 2013-08-05 NOTE — Consult Note (Signed)
Reason for Consult: ileus Referring Physician: Dr. Lala Lund   HPI: [name] is a 58 year old female with a history of MS, exploratory laparotomy with LOA and small bowel resection by Dr. Ninfa Linden 06/17/12 for recurrent SBOs.  She suddenly developed abdominal pain on Tuesday, which progressively worsened.  Associated with nausea and vomited.  She had a bowel movement on Tuesday daytime after eating a hamburger and fries followed by a salad.  She denies diarrhea, melena or hematochezia.  Denies fever or chills.  She has been in her usual state of health until this past Tuesday.  She was seen in the ED earlier on Wednesday and discharged home only to return with persistent symptoms.  She was unable to keep broth, water or yogurt down despite having been prescribed PR phenergan.  She was also noted to have a UTI and started on bactrim.  Abdominal CT showed a ileus versus enteritis and was therefore admitted for NGT decompression, bowel rest and atbx for the UTI.  She has a history of UTIs, self caths daily and is followed by Dr. Jeffie Pollock.  Today, her symptoms are essentially resolved.  Denies further nausea or vomiting.  Denies abdominal pain.  She is passing flatus.  Her NGT is presently clamped.  She has not had any recorded NGT output.  AXR today showed no evidence of obstruction.   Past Medical History  Diagnosis Date  . Hypertension   . Multiple sclerosis 1972  . Self-catheterizes urinary bladder ~ 2012    "sphincter in bladder doesn't work properly due to my MS" (05/17/2012  . History of recurrent UTIs   . History of blood transfusion ~ 2012    "after multiple UTI's; got bladder infection; passed alot of blood" (05/05/2012)  . DXAJOINO(676.7)     "several/wk" (05/17/2012)  . 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 Abdominal fluid collection 05/07/2012  . Abdominal wall fluid collections 06/15/2012  . Abdominal pain, RUQ (right upper quadrant) 06/15/2012  . Small bowel obstruction 06/17/2012  .  Neuromuscular disorder     MS    Past Surgical History  Procedure Laterality Date  . Vaginal hysterectomy  2011  . Wound debridement Bilateral 06/13/2012    Procedure: DEBRIDEMENT ABDOMINAL WOUND;  Surgeon: Stark Klein, MD;  Location: [facility];  Service: General;  Laterality: Bilateral;  . Laparotomy N/A 06/17/2012    Procedure: EXPLORATORY LAPAROTOMY FOR  SMALL BOWEL RESECTION;  Surgeon: Harl Bowie, MD;  Location: [facility];  Service: General;  Laterality: N/A;    Family History  Problem Relation Age of Onset  . Heart disease Mother   . Congestive Heart Failure Mother   . Heart disease Father   . Stroke Father   . Heart disease Brother   . Heart attack Brother     Social History:  reports that she has never smoked. She has never used smokeless tobacco. She reports that she does not drink alcohol or use illicit drugs.  Allergies:  Allergies  Allergen Reactions  . Oxycodone Nausea And Vomiting    Medications:  Scheduled Meds: . ciprofloxacin  400 mg Intravenous Q12H  . docusate sodium  200 mg Oral BID  . heparin  5,000 Units Subcutaneous 3 times per day  . metronidazole  500 mg Intravenous Q8H  . ondansetron  4 mg Oral 4 times per day   Or  . ondansetron (ZOFRAN) IV  4 mg Intravenous 4 times per day   Continuous Infusions: . dextrose 5 % and 0.45 % NaCl  with KCl 20 mEq/L Stopped (08/05/13 1115)   PRN Meds:.   Results for orders placed during the hospital encounter of 08/04/13 (from the past 48 hour(s))  CBC     Status: Abnormal   Collection Time    08/04/13  8:08 PM      Result Value Ref Range   WBC 12.4 (*) 4.0 - 10.5 K/uL   RBC 3.55 (*) 3.87 - 5.11 MIL/uL   Hemoglobin 11.6 (*) 12.0 - 15.0 g/dL   HCT 34.9 (*) 36.0 - 46.0 %   MCV 98.3  78.0 - 100.0 fL   MCH 32.7  26.0 - 34.0 pg   MCHC 33.2  30.0 - 36.0 g/dL   RDW 13.1  11.5 - 15.5 %   Platelets 261  150 - 400 K/uL  BASIC METABOLIC PANEL     Status: Abnormal   Collection Time    08/04/13  8:08 PM       Result Value Ref Range   Sodium 141  137 - 147 mEq/L   Potassium 4.6  3.7 - 5.3 mEq/L   Comment: DELTA CHECK NOTED   Chloride 102  96 - 112 mEq/L   CO2 26  19 - 32 mEq/L   Glucose, Bld 121 (*) 70 - 99 mg/dL   BUN 17  6 - 23 mg/dL   Creatinine, Ser 0.93  0.50 - 1.10 mg/dL   Calcium 9.0  8.4 - 10.5 mg/dL   GFR calc non Af Amer 65 (*) >90 mL/min   GFR calc Af Amer 76 (*) >90 mL/min   Comment: (NOTE)     The eGFR has been calculated using the CKD EPI equation.     This calculation has not been validated in all clinical situations.     eGFR's persistently <90 mL/min signify possible Chronic Kidney     Disease.   Anion gap 13  5 - 15  CBC     Status: Abnormal   Collection Time    08/04/13  9:54 PM      Result Value Ref Range   WBC 11.0 (*) 4.0 - 10.5 K/uL   RBC 3.50 (*) 3.87 - 5.11 MIL/uL   Hemoglobin 11.5 (*) 12.0 - 15.0 g/dL   HCT 34.5 (*) 36.0 - 46.0 %   MCV 98.6  78.0 - 100.0 fL   MCH 32.9  26.0 - 34.0 pg   MCHC 33.3  30.0 - 36.0 g/dL   RDW 13.3  11.5 - 15.5 %   Platelets 271  150 - 400 K/uL  CREATININE, SERUM     Status: Abnormal   Collection Time    08/04/13  9:54 PM      Result Value Ref Range   Creatinine, Ser 0.86  0.50 - 1.10 mg/dL   GFR calc non Af Amer 72 (*) >90 mL/min   GFR calc Af Amer 83 (*) >90 mL/min   Comment: (NOTE)     The eGFR has been calculated using the CKD EPI equation.     This calculation has not been validated in all clinical situations.     eGFR's persistently <90 mL/min signify possible Chronic Kidney     Disease.  GLUCOSE, CAPILLARY     Status: Abnormal   Collection Time    08/04/13 11:19 PM      Result Value Ref Range   Glucose-Capillary 103 (*) 70 - 99 mg/dL  COMPREHENSIVE METABOLIC PANEL     Status: Abnormal   Collection Time    08/05/13  3:07 AM      Result Value Ref Range   Sodium 142  137 - 147 mEq/L   Potassium 4.6  3.7 - 5.3 mEq/L   Chloride 105  96 - 112 mEq/L   CO2 26  19 - 32 mEq/L   Glucose, Bld 126 (*) 70 - 99 mg/dL   BUN  17  6 - 23 mg/dL   Creatinine, Ser 0.87  0.50 - 1.10 mg/dL   Calcium 8.5  8.4 - 10.5 mg/dL   Total Protein 5.8 (*) 6.0 - 8.3 g/dL   Albumin 3.4 (*) 3.5 - 5.2 g/dL   AST 17  0 - 37 U/L   ALT 13  0 - 35 U/L   Alkaline Phosphatase 40  39 - 117 U/L   Total Bilirubin 0.2 (*) 0.3 - 1.2 mg/dL   GFR calc non Af Amer 71 (*) >90 mL/min   GFR calc Af Amer 82 (*) >90 mL/min   Comment: (NOTE)     The eGFR has been calculated using the CKD EPI equation.     This calculation has not been validated in all clinical situations.     eGFR's persistently <90 mL/min signify possible Chronic Kidney     Disease.   Anion gap 11  5 - 15  CBC WITH DIFFERENTIAL     Status: Abnormal   Collection Time    08/05/13  3:07 AM      Result Value Ref Range   WBC 8.7  4.0 - 10.5 K/uL   RBC 3.23 (*) 3.87 - 5.11 MIL/uL   Hemoglobin 10.7 (*) 12.0 - 15.0 g/dL   HCT 32.4 (*) 36.0 - 46.0 %   MCV 100.3 (*) 78.0 - 100.0 fL   MCH 33.1  26.0 - 34.0 pg   MCHC 33.0  30.0 - 36.0 g/dL   RDW 13.4  11.5 - 15.5 %   Platelets 244  150 - 400 K/uL   Neutrophils Relative % 69  43 - 77 %   Neutro Abs 6.1  1.7 - 7.7 K/uL   Lymphocytes Relative 21  12 - 46 %   Lymphs Abs 1.8  0.7 - 4.0 K/uL   Monocytes Relative 9  3 - 12 %   Monocytes Absolute 0.8  0.1 - 1.0 K/uL   Eosinophils Relative 1  0 - 5 %   Eosinophils Absolute 0.1  0.0 - 0.7 K/uL   Basophils Relative 0  0 - 1 %   Basophils Absolute 0.0  0.0 - 0.1 K/uL    Ct Abdomen Pelvis W Contrast  08/04/2013   CLINICAL DATA:  Bilateral lower quadrant abdominal pain radiating to the back. History of multiple abdominal surgeries.  EXAM: CT ABDOMEN AND PELVIS WITH CONTRAST  TECHNIQUE: Multidetector CT imaging of the abdomen and pelvis was performed using the standard protocol following bolus administration of intravenous contrast.  CONTRAST:  39m OMNIPAQUE IOHEXOL 300 MG/ML  SOLN  COMPARISON:  06/17/2012  FINDINGS: Mild dependent changes in the lung bases.  The liver, spleen, gallbladder,  pancreas, adrenal glands, kidneys, abdominal aorta, inferior vena cava, and retroperitoneal lymph nodes are unremarkable. Stomach, small bowel, and colon are not abnormally distended. Fluid is present in nondistended lower abdominal small bowel without wall thickening. This may be due to enteritis or localized ileus. Partial obstruction not entirely excluded. There appears to be an anastomosis in the terminal ileum. No free air in the abdomen. Scarring in the anterior abdominal wall consistent with postoperative change.  Pelvis:  The appendix is normal. Small amount of free fluid in the pelvis. Fluid along the right pericolic gutter. Bladder wall is not thickened. No evidence of diverticulitis. No loculated pelvic fluid collections. No significant pelvic lymphadenopathy. Compression deformities of lower thoracic and upper lumbar vertebrae with Schmorl's nodes. This is unchanged since prior study in likely indicates osteoporosis. Postoperative changes in the right lower quadrant small bowel. Nonspecific fluid filled nondilated distal small bowel without wall thickening. This could represent enteritis or localized ileus. Small amount of free fluid in the pelvis is likely physiologic or reactive.   Electronically Signed   By: Lucienne Capers M.D.   On: 08/04/2013 05:16   Dg Abd 2 Views  08/04/2013   CLINICAL DATA:  Abdominal pain and distention  EXAM: ABDOMEN - 2 VIEW  COMPARISON:  August 04, 2013  FINDINGS: Supine and upright images were obtained. Nasogastric tube tip and side port are in the stomach. There is mild bowel dilatation without appreciable air-fluid level. Contrast is seen in the colon. No free air seen. There is contrast in the urinary bladder.  IMPRESSION: No free air appreciable. Mild bowel dilatation. Contrast is seen in the large bowel. Question partial resolution of bowel obstruction. Nasogastric tube tip and side port in stomach.   Electronically Signed   By: Lowella Grip M.D.   On: 08/04/2013  21:51   Dg Abd Acute W/chest  08/04/2013   CLINICAL DATA:  Left lower quadrant abdominal pain and nausea.  EXAM: ACUTE ABDOMEN SERIES (ABDOMEN 2 VIEW & CHEST 1 VIEW)  COMPARISON:  06/17/2012  FINDINGS: Normal heart size and pulmonary vascularity. Lungs are clear. No focal airspace disease or consolidation.  Abdomen: Borderline prominence of gas-filled pelvic small bowel. Appearance is nonspecific but could suggest early obstruction or localized ileus. Stool-filled colon without distention. No free intra-abdominal air. Small air-fluid levels in the pelvic small bowel loops. No radiopaque stones. Mild thoracolumbar scoliosis.  IMPRESSION: No evidence of active pulmonary disease. Nonspecific gas-filled mildly dilated small bowel loops in the pelvis could indicate early obstruction or localized ileus.   Electronically Signed   By: Lucienne Capers M.D.   On: 08/04/2013 02:54   Dg Abd Portable 1v  08/05/2013   CLINICAL DATA:  Abdominal pain, constipation  EXAM: PORTABLE ABDOMEN - 1 VIEW  COMPARISON:  Abdomen films of 08/04/2013  FINDINGS: A portable supine film of the abdomen shows some contrast remaining within the nondistended right colon. No radiographic evidence of constipation is seen. No bowel obstruction is noted. An NG tube is noted with the tip near the expected GE junction.  IMPRESSION: No evidence of constipation radiographically. No obstruction. NG tube tip near expected GE junction.   Electronically Signed   By: Ivar Drape M.D.   On: 08/05/2013 08:56    Review of Systems  All other systems reviewed and are negative.  Blood pressure 142/66, pulse 78, temperature 98.5 F (36.9 C), temperature source Oral, resp. rate 18, height 5' 2"  (1.575 m), weight 128 lb 4.9 oz (58.2 kg), SpO2 100.00%. Physical Exam  Constitutional: She appears well-developed and well-nourished. No distress.  Cardiovascular: Normal rate, regular rhythm and intact distal pulses.  Exam reveals no gallop and no friction rub.    No murmur heard. Respiratory: Effort normal and breath sounds normal. No respiratory distress. She has no wheezes. She has no rales. She exhibits no tenderness.  GI: Soft. Bowel sounds are normal. She exhibits no distension and no mass. There is no rebound and no guarding.  Minimal suprapubic tenderness  Musculoskeletal: Normal range of motion. She exhibits no edema and no tenderness.  Neurological: She is alert.  Skin: Skin is warm and dry. She is not diaphoretic.  Psychiatric: She has a normal mood and affect. Her behavior is normal. Judgment and thought content normal.    Assessment/Plan: Abdominal pain, nausea and vomiting Ileus UTI  Non surgical abdomen.  Agree with clamping trial, if able to tolerate starting clears and advancing as tolerated.  She is mobilizing in hallways, passing flatus and pain free.  Will sign off.  Please do not hesitate to contact [facility] with any questions or concerns.  Jakaiya Netherland ANP-BC 08/05/2013, 11:39 AM

## 2013-08-05 NOTE — Progress Notes (Signed)
UR Completed Rayquan Amrhein Graves-Bigelow, RN,BSN [phone]  

## 2013-08-05 NOTE — Progress Notes (Signed)
ANTIBIOTIC CONSULT NOTE - f/u  Pharmacy Consult for cipro Indication: UTI, enteritis  Allergies  Allergen Reactions  . Oxycodone Nausea And Vomiting    Patient Measurements: Height: 5\' 2"  (157.5 cm) Weight: 128 lb 4.9 oz (58.2 kg) IBW/kg (Calculated) : 50.1 Adjusted Body Weight:   Vital Signs: Temp: 98.5 F (36.9 C) (07/09 0558) Temp src: Oral (07/09 0558) BP: 142/66 mmHg (07/09 0558) Pulse Rate: 78 (07/09 0558) Intake/Output from previous day: 07/08 0701 - 07/09 0700 In: 796.7 [I.V.:596.7; IV Piggyback:200] Out: 500 [Urine:500] Intake/Output from this shift:    Labs:  Recent Labs  08/04/13 2008 08/04/13 2154 08/05/13 0307  WBC 12.4* 11.0* 8.7  HGB 11.6* 11.5* 10.7*  PLT 261 271 244  CREATININE 0.93 0.86 0.87   Estimated Creatinine Clearance: 54.4 ml/min (by C-G formula based on Cr of 0.87). No results found for this basename: VANCOTROUGH, VANCOPEAK, VANCORANDOM, GENTTROUGH, GENTPEAK, GENTRANDOM, TOBRATROUGH, TOBRAPEAK, TOBRARND, AMIKACINPEAK, AMIKACINTROU, AMIKACIN,  in the last 72 hours   Microbiology: No results found for this or any previous visit (from the past 720 hour(s)).  Medical History: Past Medical History  Diagnosis Date  . Hypertension   . Multiple sclerosis 1972  . Self-catheterizes urinary bladder ~ 2012    "sphincter in bladder doesn't work properly due to my MS" (05/17/2012  . History of recurrent UTIs   . History of blood transfusion ~ 2012    "after multiple UTI's; got bladder infection; passed alot of blood" (05/05/2012)  . AVWUJWJX(914.7)     "several/wk" (05/17/2012)  . 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 Abdominal fluid collection 05/07/2012  . Abdominal wall fluid collections 06/15/2012  . Abdominal pain, RUQ (right upper quadrant) 06/15/2012  . Small bowel obstruction 06/17/2012  . Neuromuscular disorder     MS    Medications:  Anti-infectives   Start     Dose/Rate Route Frequency Ordered Stop   08/04/13 2200  metroNIDAZOLE (FLAGYL)  IVPB 500 mg     500 mg 100 mL/hr over 60 Minutes Intravenous Every 8 hours 08/04/13 2149     08/04/13 2200  ciprofloxacin (CIPRO) IVPB 400 mg     400 mg 200 mL/hr over 60 Minutes Intravenous Every 12 hours 08/04/13 2157       Assessment: 42 yof presented to the ED with abdominal pain. Was prescribed macrobid for a UTI 7/8. Cipro + flagyl for coverage of UTI and enteritis. Pt is afebrile. WBC down to 8.7. Scr is WNL.   Cipro 7/8>> Flagyl 7/8>>  Goal of Therapy:  Eradication of infection  Plan:  1. Cipro 400mg  IV BID 2. Pharmacy will sign off. No further dosing adjustment needed at this point.  Jeriyah Granlund S. Alford Highland, PharmD, BCPS Clinical Staff Pharmacist Pager [phone]  Eilene Ghazi Stillinger 08/05/2013,10:41 AM

## 2013-08-05 NOTE — Consult Note (Signed)
Patient ambulating in halls Passing flatus, no bowel movements yet Abdomen not distended Ng out - no nausea  No surgical indications.  Advance diet as tolerated.  Imogene Burn. Georgette Dover, MD, [facility]  General/ Trauma Surgery  08/05/2013 4:30 PM

## 2013-08-06 LAB — COMPREHENSIVE METABOLIC PANEL
ALBUMIN: 3.2 g/dL — AB (ref 3.5–5.2)
ALT: 11 U/L (ref 0–35)
ANION GAP: 8 (ref 5–15)
AST: 15 U/L (ref 0–37)
Alkaline Phosphatase: 37 U/L — ABNORMAL LOW (ref 39–117)
BILIRUBIN TOTAL: 0.2 mg/dL — AB (ref 0.3–1.2)
BUN: 9 mg/dL (ref 6–23)
CALCIUM: 8.8 mg/dL (ref 8.4–10.5)
CHLORIDE: 105 meq/L (ref 96–112)
CO2: 29 meq/L (ref 19–32)
Creatinine, Ser: 0.91 mg/dL (ref 0.50–1.10)
GFR calc Af Amer: 78 mL/min — ABNORMAL LOW (ref >90)
GFR, EST NON AFRICAN AMERICAN: 67 mL/min — AB (ref >90)
Glucose, Bld: 99 mg/dL (ref 70–99)
Potassium: 4.8 mEq/L (ref 3.7–5.3)
Sodium: 142 mEq/L (ref 137–147)
Total Protein: 5.8 g/dL — ABNORMAL LOW (ref 6.0–8.3)

## 2013-08-06 LAB — CBC WITH DIFFERENTIAL/PLATELET
BASOS ABS: 0 10*3/uL (ref 0.0–0.1)
Basophils Relative: 0 % (ref 0–1)
Eosinophils Absolute: 0.1 10*3/uL (ref 0.0–0.7)
Eosinophils Relative: 2 % (ref 0–5)
HCT: 31.3 % — ABNORMAL LOW (ref 36.0–46.0)
Hemoglobin: 10.2 g/dL — ABNORMAL LOW (ref 12.0–15.0)
LYMPHS ABS: 2.2 10*3/uL (ref 0.7–4.0)
LYMPHS PCT: 38 % (ref 12–46)
MCH: 32.7 pg (ref 26.0–34.0)
MCHC: 32.6 g/dL (ref 30.0–36.0)
MCV: 100.3 fL — ABNORMAL HIGH (ref 78.0–100.0)
Monocytes Absolute: 0.6 10*3/uL (ref 0.1–1.0)
Monocytes Relative: 10 % (ref 3–12)
NEUTROS ABS: 3 10*3/uL (ref 1.7–7.7)
NEUTROS PCT: 50 % (ref 43–77)
PLATELETS: 221 10*3/uL (ref 150–400)
RBC: 3.12 MIL/uL — AB (ref 3.87–5.11)
RDW: 13.5 % (ref 11.5–15.5)
WBC: 5.9 10*3/uL (ref 4.0–10.5)

## 2013-08-06 LAB — URINE CULTURE: Colony Count: >=100000

## 2013-08-06 MED ORDER — CIPROFLOXACIN HCL 500 MG PO TABS
500.0000 mg | ORAL_TABLET | Freq: Two times a day (BID) | ORAL | Status: DC
Start: 2013-08-06 — End: 2021-02-12

## 2013-08-06 MED ORDER — DSS 100 MG PO CAPS: 100.0000 mg | ORAL_CAPSULE | Freq: Every day | ORAL | Status: DC

## 2013-08-06 MED ORDER — METRONIDAZOLE 500 MG PO TABS: 500.0000 mg | ORAL_TABLET | Freq: Three times a day (TID) | ORAL | Status: DC

## 2013-08-06 NOTE — ED Provider Notes (Signed)
Medical screening examination/treatment/procedure(s) were performed by non-physician practitioner and as supervising physician I was immediately available for consultation/collaboration.    Dot Lanes, MD 08/06/13 [phone]

## 2013-08-06 NOTE — Discharge Instructions (Signed)
Follow with Primary MD KAPLAN,KRISTEN, PA-C in 4 days   Get CBC, CMP, abdominal X ray checked  by Primary MD next visit.    Activity: As tolerated with Full fall precautions use walker/cane & assistance as needed   Disposition Home     Diet: Heart Healthy soft diet. Advance to regular consistency as tolerated over the next 3-4 days.  For Heart failure patients - Check your Weight same time everyday, if you gain over 2 pounds, or you develop in leg swelling, experience more shortness of breath or chest pain, call your Primary MD immediately. Follow Cardiac Low Salt Diet and 1.8 lit/day fluid restriction.   On your next visit with her primary care physician please Get Medicines reviewed and adjusted.  Please request your Prim.MD to go over all Hospital Tests and Procedure/Radiological results at the follow up, please get all Hospital records sent to your Prim MD by signing hospital release before you go home.   If you experience worsening of your admission symptoms, develop shortness of breath, life threatening emergency, suicidal or homicidal thoughts you must seek medical attention immediately by calling 911 or calling your MD immediately  if symptoms less severe.  You Must read complete instructions/literature along with all the possible adverse reactions/side effects for all the Medicines you take and that have been prescribed to you. Take any new Medicines after you have completely understood and accpet all the possible adverse reactions/side effects.   Do not drive, operating heavy machinery, perform activities at heights, swimming or participation in water activities or provide baby sitting services if your were admitted for syncope or siezures until you have seen by Primary MD or a Neurologist and advised to do so again.  Do not drive when taking Pain medications.    Do not take more than prescribed Pain, Sleep and Anxiety Medications  Special Instructions: If you have smoked or  chewed Tobacco  in the last 2 yrs please stop smoking, stop any regular Alcohol  and or any Recreational drug use.  Wear Seat belts while driving.   Please note  You were cared for by a hospitalist during your hospital stay. If you have any questions about your discharge medications or the care you received while you were in the hospital after you are discharged, you can call the unit and asked to speak with the hospitalist on call if the hospitalist that took care of you is not available. Once you are discharged, your primary care physician will handle any further medical issues. Please note that NO REFILLS for any discharge medications will be authorized once you are discharged, as it is imperative that you return to your primary care physician (or establish a relationship with a primary care physician if you do not have one) for your aftercare needs so that they can reassess your need for medications and monitor your lab values.

## 2013-08-06 NOTE — Discharge Summary (Signed)
[name], is a 58 y.o. female  DOB 06-Mar-1955  MRN [id number].  Admission date:  08/04/2013  Admitting Physician  Shanda Howells, MD  Discharge Date:  08/06/2013   Primary MD  Tula Nakayama  Recommendations for primary care physician for things to follow:   Please repeat CBC, BMP and abdominal x-ray in 3-4 days.   Please review CT scan report closely. Appropriate clinical followup.   Admission Diagnosis  Ileus [560.1] Intractable vomiting with nausea, vomiting of unspecified type [536.2]   Discharge Diagnosis  Ileus [560.1] Intractable vomiting with nausea, vomiting of unspecified type [536.2]    Active Problems:   Ileus      Past Medical History  Diagnosis Date  . Hypertension   . Multiple sclerosis 1972  . Self-catheterizes urinary bladder ~ 2012    "sphincter in bladder doesn't work properly due to my MS" (05/17/2012  . History of recurrent UTIs   . History of blood transfusion ~ 2012    "after multiple UTI's; got bladder infection; passed alot of blood" (05/05/2012)  . 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 Abdominal fluid collection 05/07/2012  . Abdominal wall fluid collections 06/15/2012  . Abdominal pain, RUQ (right upper quadrant) 06/15/2012  . Small bowel obstruction 06/17/2012  . Neuromuscular disorder     MS    Past Surgical History  Procedure Laterality Date  . Vaginal hysterectomy  2011  . Wound debridement Bilateral 06/13/2012    Procedure: DEBRIDEMENT ABDOMINAL WOUND;  Surgeon: Stark Klein, MD;  Location: [facility];  Service: General;  Laterality: Bilateral;  . Laparotomy N/A 06/17/2012    Procedure: EXPLORATORY LAPAROTOMY FOR  SMALL BOWEL RESECTION;  Surgeon: Harl Bowie, MD;  Location: [facility];  Service: General;  Laterality: N/A;       History  of present illness and  Hospital Course:     Kindly see H&P for history of present illness and admission details, please review complete Labs, Consult reports and Test reports for all details in brief  HPI  from the history and physical done on the day of admission  History of Present Illness:This is a 58 y.o. year old female with significant past medical history of abd pain s/p exlap and bowel resection 07/2012, HTN, MS presenting with ileus, UTI. Pt was seen in ER earlier today for abd pain and vomiting. Had CT done concerning for ileus vs. Enteritis. Pt was given PR anti-emetics and discharged home. Also with UTI , prescribed bactrim. Pt states that she went home and sxs progressively worsened. Unable to keep any solids or liquids. Was only able to take 1 bactrim. Denies any fevers or chills. + vomiting. Emesis NBNB.   Re-presented to ER today afebrile, hemodynamically stable. WBC 10-->12.4. Hgb stable around 11.6. Cr around 1. Glu 121. F/u KUB pending. NGT placed at [facility]    1. Abdominal pain and nausea most likely due to mild antritis with ileus, history of SBO requiring  surgery in the past. On Cipro Flagyl her, was given bowel rest for 12 hours, repeat x-rays unremarkable, NG tube was removed she tolerated diet well, seen by general surgery cleared for discharge. Likely had gastroenteritis reduced ileus. We'll follow with PCP and general surgeon Dr. Ninfa Linden who had operated on her for small bowel obstruction in the past in a timely fashion. We'll request PCP to kindly review CT scan closely. Appropriate clinical followup   2. Hypertension stable. Home medication.   3. UTI. On Cipro. PCP. Culture is inconclusive her.    Discharge Condition: stable   Follow UP  Follow-up Information   Follow up with KAPLAN,KRISTEN, PA-C. Schedule an appointment as soon as possible for a visit in 4 days.   Specialty:  Family Medicine   Contact information:   [address]  [place] [address] [phone]       Follow up with [facility], MD. Schedule an appointment as soon as possible for a visit in 1 week.   Specialty:  General Surgery   Contact information:   [address] [place] [address] [phone]         Discharge Instructions  and  Discharge Medications     Discharge Instructions   Discharge instructions    Complete by:  As directed   Follow with Primary MD KAPLAN,KRISTEN, PA-C in 4 days   Get CBC, CMP, abdominal X ray checked  by Primary MD next visit.    Activity: As tolerated with Full fall precautions use walker/cane & assistance as needed   Disposition Home     Diet: Heart Healthy soft diet. Advance to regular consistency as tolerated over the next 3-4 days.  For Heart failure patients - Check your Weight same time everyday, if you gain over 2 pounds, or you develop in leg swelling, experience more shortness of breath or chest pain, call your Primary MD immediately. Follow Cardiac Low Salt Diet and 1.8 lit/day fluid restriction.   On your next visit with her primary care physician please Get Medicines reviewed and adjusted.  Please request your Prim.MD to go over all Hospital Tests and Procedure/Radiological results at the follow up, please get all Hospital records sent to your Prim MD by signing hospital release before you go home.   If you experience worsening of your admission symptoms, develop shortness of breath, life threatening emergency, suicidal or homicidal thoughts you must seek medical attention immediately by calling 911 or calling your MD immediately  if symptoms less severe.  You Must read complete instructions/literature along with all the possible adverse reactions/side effects for all the Medicines you take and that have been prescribed to you. Take any new Medicines after you have completely understood and accpet all the possible adverse reactions/side effects.   Do not  drive, operating heavy machinery, perform activities at heights, swimming or participation in water activities or provide baby sitting services if your were admitted for syncope or siezures until you have seen by Primary MD or a Neurologist and advised to do so again.  Do not drive when taking Pain medications.    Do not take more than prescribed Pain, Sleep and Anxiety Medications  Special Instructions: If you have smoked or chewed Tobacco  in the last 2 yrs please stop smoking, stop any regular Alcohol  and or any Recreational drug use.  Wear Seat belts while driving.   Please note  You were cared for by a hospitalist during your hospital stay. If  you have any questions about your discharge medications or the care you received while you were in the hospital after you are discharged, you can call the unit and asked to speak with the hospitalist on call if the hospitalist that took care of you is not available. Once you are discharged, your primary care physician will handle any further medical issues. Please note that NO REFILLS for any discharge medications will be authorized once you are discharged, as it is imperative that you return to your primary care physician (or establish a relationship with a primary care physician if you do not have one) for your aftercare needs so that they can reassess your need for medications and monitor your lab values.  Follow with Primary MD KAPLAN,KRISTEN, PA-C in 4 days   Get CBC, CMP, abdominal X ray checked  by Primary MD next visit.    Activity: As tolerated with Full fall precautions use walker/cane & assistance as needed   Disposition Home     Diet: Heart Healthy soft diet. Advance to regular consistency as tolerated over the next 3-4 days.  For Heart failure patients - Check your Weight same time everyday, if you gain over 2 pounds, or you develop in leg swelling, experience more shortness of breath or chest pain, call your Primary MD  immediately. Follow Cardiac Low Salt Diet and 1.8 lit/day fluid restriction.   On your next visit with her primary care physician please Get Medicines reviewed and adjusted.  Please request your Prim.MD to go over all Hospital Tests and Procedure/Radiological results at the follow up, please get all Hospital records sent to your Prim MD by signing hospital release before you go home.   If you experience worsening of your admission symptoms, develop shortness of breath, life threatening emergency, suicidal or homicidal thoughts you must seek medical attention immediately by calling 911 or calling your MD immediately  if symptoms less severe.  You Must read complete instructions/literature along with all the possible adverse reactions/side effects for all the Medicines you take and that have been prescribed to you. Take any new Medicines after you have completely understood and accpet all the possible adverse reactions/side effects.   Do not drive, operating heavy machinery, perform activities at heights, swimming or participation in water activities or provide baby sitting services if your were admitted for syncope or siezures until you have seen by Primary MD or a Neurologist and advised to do so again.  Do not drive when taking Pain medications.    Do not take more than prescribed Pain, Sleep and Anxiety Medications  Special Instructions: If you have smoked or chewed Tobacco  in the last 2 yrs please stop smoking, stop any regular Alcohol  and or any Recreational drug use.  Wear Seat belts while driving.   Please note  You were cared for by a hospitalist during your hospital stay. If you have any questions about your discharge medications or the care you received while you were in the hospital after you are discharged, you can call the unit and asked to speak with the hospitalist on call if the hospitalist that took care of you is not available. Once you are discharged, your primary care  physician will handle any further medical issues. Please note that NO REFILLS for any discharge medications will be authorized once you are discharged, as it is imperative that you return to your primary care physician (or establish a relationship with a primary care physician if you do not have  one) for your aftercare needs so that they can reassess your need for medications and monitor your lab values.     Increase activity slowly    Complete by:  As directed             Medication List    STOP taking these medications       nitrofurantoin (macrocrystal-monohydrate) 100 MG capsule  Commonly known as:  MACROBID      TAKE these medications       aspirin-acetaminophen-caffeine 250-250-65 MG per tablet  Commonly known as:  EXCEDRIN MIGRAINE  Take 2 tablets by mouth every 6 (six) hours as needed for pain.     CALCIUM 600 + D PO  Take 1 tablet by mouth daily.     ciprofloxacin 500 MG tablet  Commonly known as:  CIPRO  Take 1 tablet (500 mg total) by mouth 2 (two) times daily.     DSS 100 MG Caps  Take 100 mg by mouth daily. Do not take if your having abdominal pain or nausea     ferrous sulfate 325 (65 FE) MG tablet  Take 325 mg by mouth daily with breakfast.     lisinopril 10 MG tablet  Commonly known as:  PRINIVIL,ZESTRIL  Take 10 mg by mouth daily.     metroNIDAZOLE 500 MG tablet  Commonly known as:  FLAGYL  Take 1 tablet (500 mg total) by mouth 3 (three) times daily.     multivitamin tablet  Take 1 tablet by mouth daily.     OSTEO BI-FLEX ADV JOINT SHIELD PO  Take 1 tablet by mouth daily.     oxybutynin 5 MG tablet  Commonly known as:  DITROPAN  Take 5 mg by mouth 3 (three) times daily.     traMADol 50 MG tablet  Commonly known as:  ULTRAM  Take 1 tablet (50 mg total) by mouth every 6 (six) hours as needed.          Diet and Activity recommendation: See Discharge Instructions above   Consults obtained - [facility]   Major procedures and Radiology Reports -  PLEASE review detailed and final reports for all details, in brief -      Ct Abdomen Pelvis W Contrast  08/04/2013   CLINICAL DATA:  Bilateral lower quadrant abdominal pain radiating to the back. History of multiple abdominal surgeries.  EXAM: CT ABDOMEN AND PELVIS WITH CONTRAST  TECHNIQUE: Multidetector CT imaging of the abdomen and pelvis was performed using the standard protocol following bolus administration of intravenous contrast.  CONTRAST:  17mL OMNIPAQUE IOHEXOL 300 MG/ML  SOLN  COMPARISON:  06/17/2012  FINDINGS: Mild dependent changes in the lung bases.  The liver, spleen, gallbladder, pancreas, adrenal glands, kidneys, abdominal aorta, inferior vena cava, and retroperitoneal lymph nodes are unremarkable. Stomach, small bowel, and colon are not abnormally distended. Fluid is present in nondistended lower abdominal small bowel without wall thickening. This may be due to enteritis or localized ileus. Partial obstruction not entirely excluded. There appears to be an anastomosis in the terminal ileum. No free air in the abdomen. Scarring in the anterior abdominal wall consistent with postoperative change.  Pelvis: The appendix is normal. Small amount of free fluid in the pelvis. Fluid along the right pericolic gutter. Bladder wall is not thickened. No evidence of diverticulitis. No loculated pelvic fluid collections. No significant pelvic lymphadenopathy. Compression deformities of lower thoracic and upper lumbar vertebrae with Schmorl's nodes. This is unchanged since prior study in likely indicates osteoporosis.  Postoperative changes in the right lower quadrant small bowel. Nonspecific fluid filled nondilated distal small bowel without wall thickening. This could represent enteritis or localized ileus. Small amount of free fluid in the pelvis is likely physiologic or reactive.   Electronically Signed   By: Lucienne Capers M.D.   On: 08/04/2013 05:16      Dg Abd Portable 1v  08/05/2013   CLINICAL  DATA:  Abdominal pain, constipation  EXAM: PORTABLE ABDOMEN - 1 VIEW  COMPARISON:  Abdomen films of 08/04/2013  FINDINGS: A portable supine film of the abdomen shows some contrast remaining within the nondistended right colon. No radiographic evidence of constipation is seen. No bowel obstruction is noted. An NG tube is noted with the tip near the expected GE junction.  IMPRESSION: No evidence of constipation radiographically. No obstruction. NG tube tip near expected GE junction.   Electronically Signed   By: Ivar Drape M.D.   On: 08/05/2013 08:56    Micro Results      Recent Results (from the past 240 hour(s))  URINE CULTURE     Status: None   Collection Time    08/05/13  5:49 AM      Result Value Ref Range Status   Specimen Description URINE, CATHETERIZED   Final   Special Requests NONE   Final   Culture  Setup Time     Final   Value: 08/05/2013 08:36     Performed at [facility]     Final   Value: >=100,000 COLONIES/ML     Performed at [facility]   Culture     Final   Value: Multiple bacterial morphotypes present, none predominant. Suggest appropriate recollection if clinically indicated.     Performed at [facility]   Report Status 08/06/2013 FINAL   Final       Today   Subjective:   Dusty Wagoner today has no headache,no chest abdominal pain,no new weakness tingling or numbness, feels much better wants to go home today.    Objective:   Blood pressure 127/49, pulse 77, temperature 97.7 F (36.5 C), temperature source Oral, resp. rate 17, height 5\' 2"  (1.575 m), weight 58.2 kg (128 lb 4.9 oz), SpO2 100.00%.   Intake/Output Summary (Last 24 hours) at 08/06/13 1025 Last data filed at 08/06/13 7341  Gross per 24 hour  Intake 3100.5 ml  Output    200 ml  Net 2900.5 ml    Exam Awake Alert, Oriented x 3, No new F.N deficits, Normal affect [place].AT,PERRAL Supple Neck,No JVD, No cervical lymphadenopathy appriciated.    Symmetrical Chest wall movement, Good air movement bilaterally, CTAB RRR,No Gallops,Rubs or new Murmurs, No Parasternal Heave +ve B.Sounds, Abd Soft, Non tender, No organomegaly appriciated, No rebound -guarding or rigidity. No Cyanosis, Clubbing or edema, No new Rash or bruise  Data Review   CBC w Diff: Lab Results  Component Value Date   WBC 5.9 08/06/2013   HGB 10.2* 08/06/2013   HCT 31.3* 08/06/2013   PLT 221 08/06/2013   LYMPHOPCT 38 08/06/2013   MONOPCT 10 08/06/2013   EOSPCT 2 08/06/2013   BASOPCT 0 08/06/2013    CMP: Lab Results  Component Value Date   NA 142 08/06/2013   K 4.8 08/06/2013   CL 105 08/06/2013   CO2 29 08/06/2013   BUN 9 08/06/2013   CREATININE 0.91 08/06/2013   PROT 5.8* 08/06/2013   ALBUMIN 3.2* 08/06/2013   BILITOT 0.2* 08/06/2013  ALKPHOS 37* 08/06/2013   AST 15 08/06/2013   ALT 11 08/06/2013  .   Total Time in preparing paper work, data evaluation and todays exam - 35 minutes  Thurnell Lose M.D on 08/06/2013 at 10:25 AM  [facility] Office  [phone]   **Disclaimer: This note may have been dictated with voice recognition software. Similar sounding words can inadvertently be transcribed and this note may contain transcription errors which may not have been corrected upon publication of note.**

## 2014-05-26 ENCOUNTER — Other Ambulatory Visit: Payer: Self-pay | Admitting: Family Medicine

## 2014-05-26 DIAGNOSIS — R0989 Other specified symptoms and signs involving the circulatory and respiratory systems: Secondary | ICD-10-CM

## 2014-05-30 ENCOUNTER — Ambulatory Visit
Admission: RE | Admit: 2014-05-30 | Discharge: 2014-05-30 | Disposition: A | Discharge status: Home / Self Care | Payer: Medicare Other | Source: Ambulatory Visit | Attending: Family Medicine | Admitting: Family Medicine

## 2014-05-30 DIAGNOSIS — R0989 Other specified symptoms and signs involving the circulatory and respiratory systems: Secondary | ICD-10-CM

## 2014-08-24 IMAGING — US US ABDOMEN LIMITED
1 series · 9 of 9 positions shown · non-contrast
Comparison: CT 05/06/2012

CLINICAL DATA: Follow-up abdominal wall abscess drainage

LIMITED ABDOMINAL ULTRASOUND - RIGHT UPPER QUADRANT

[Series 1: us abdomen limited · 0.09mm/px · 9 of 9 slices shown]
[im 1/9]
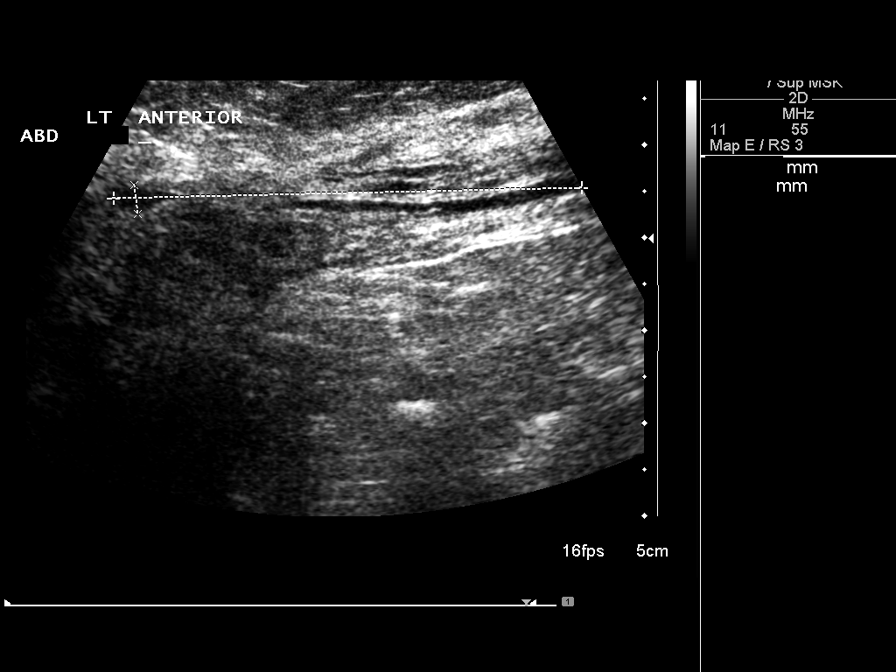
[im 2/9]
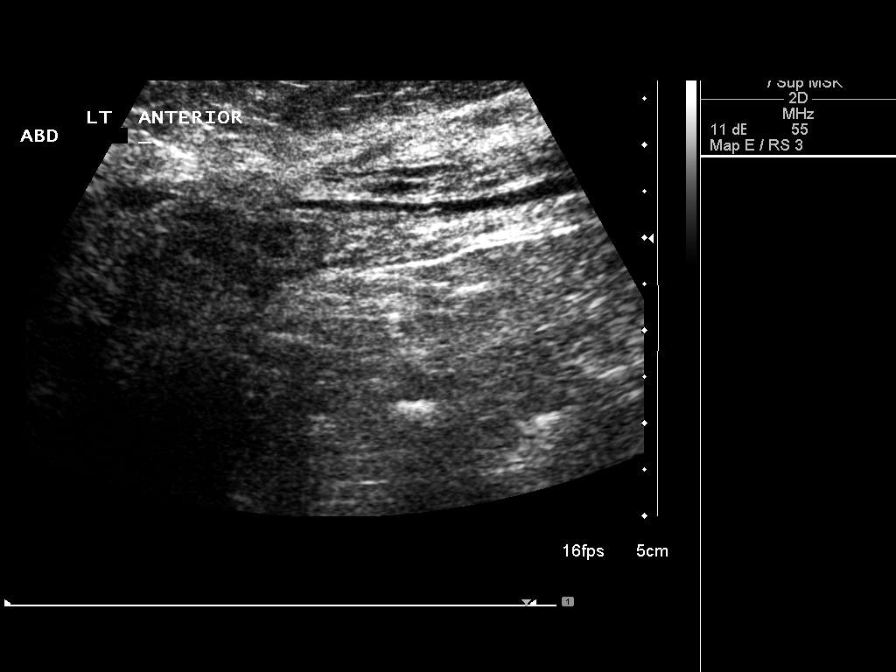
[im 3/9]
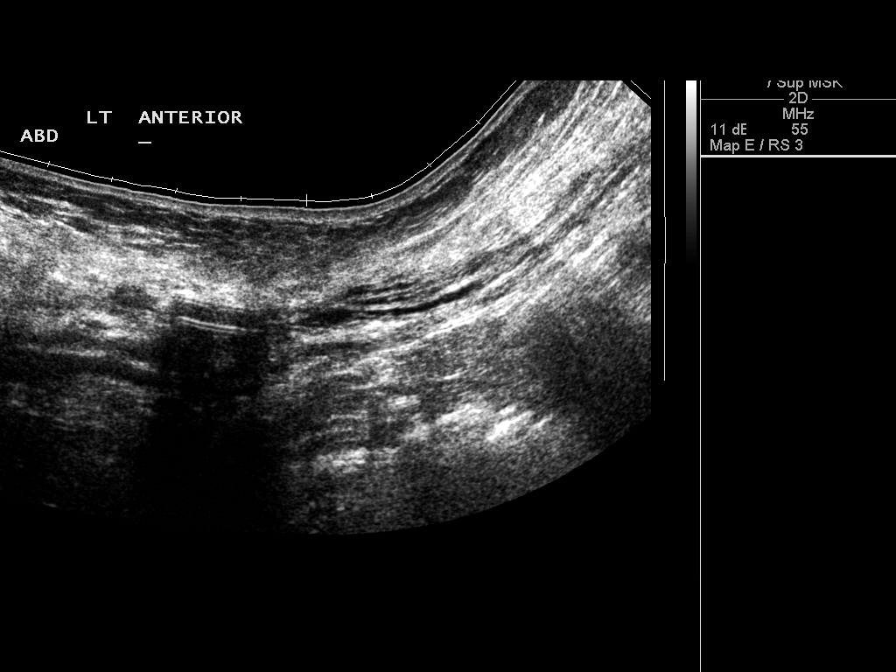
[im 4/9]
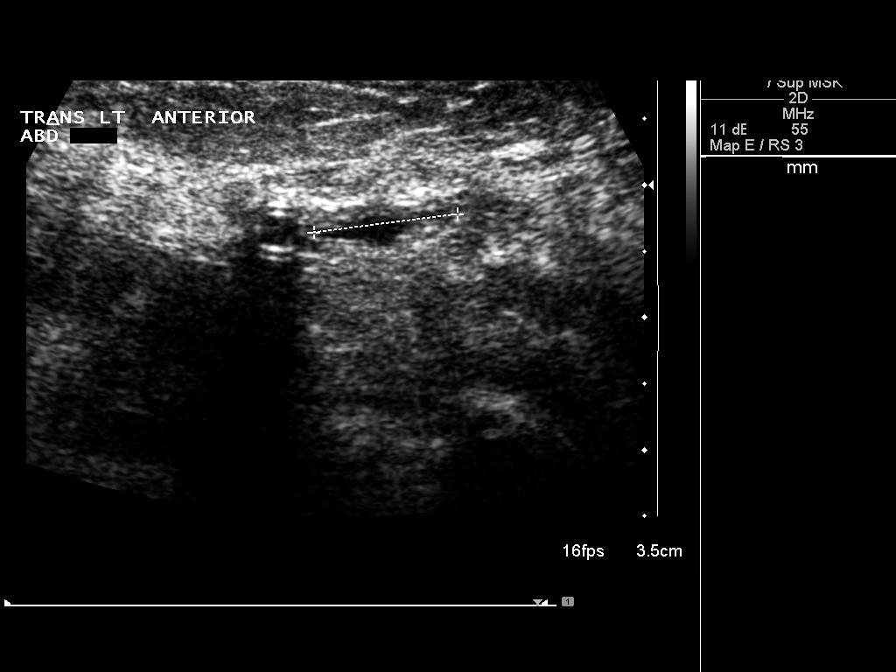
[im 5/9]
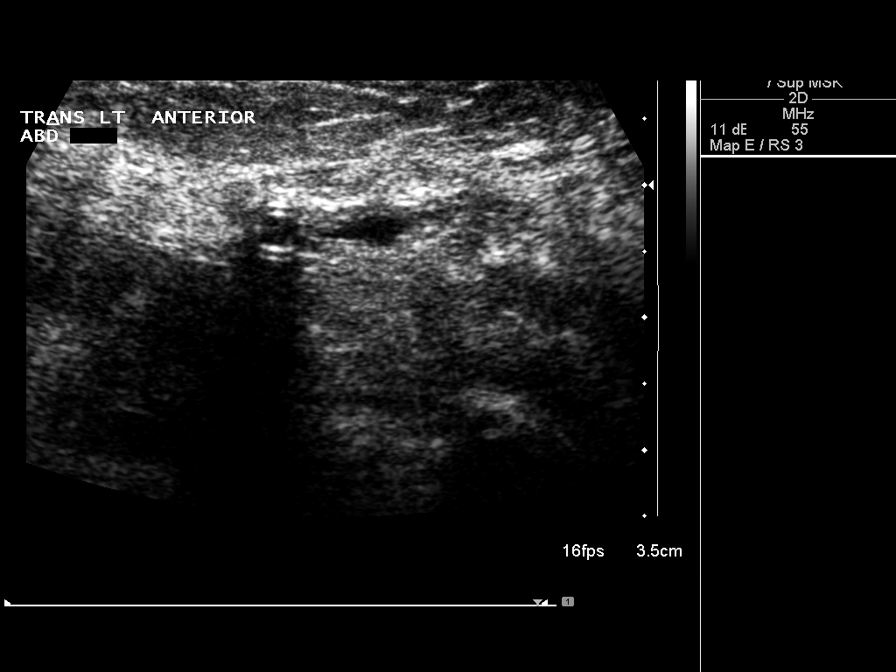
[im 6/9]
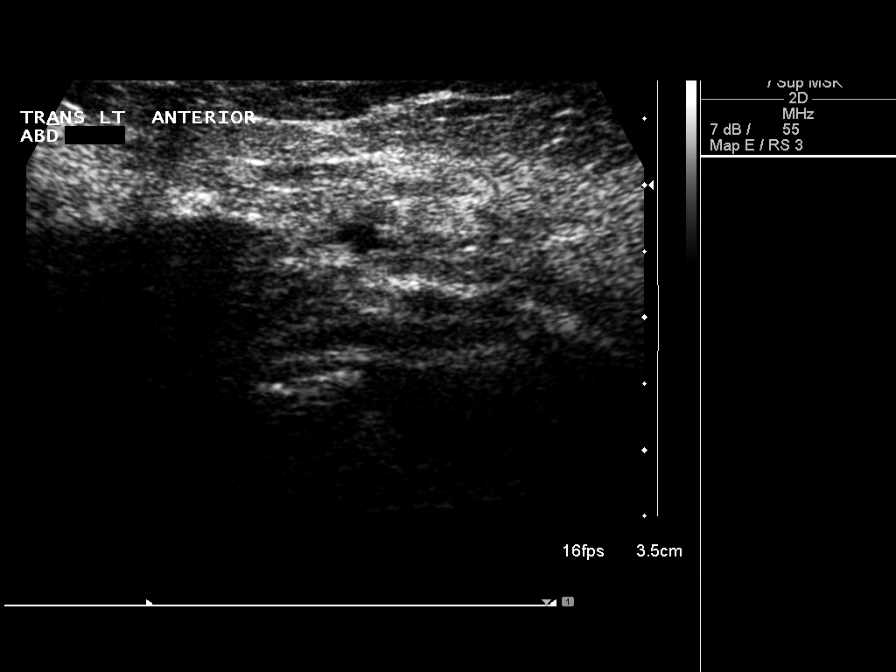
[im 7/9]
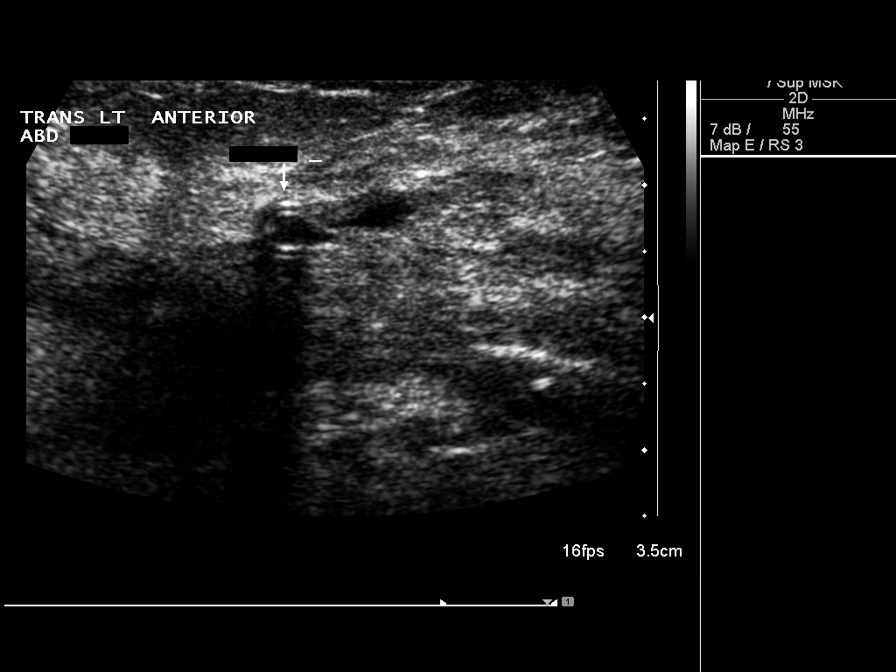
[im 8/9]
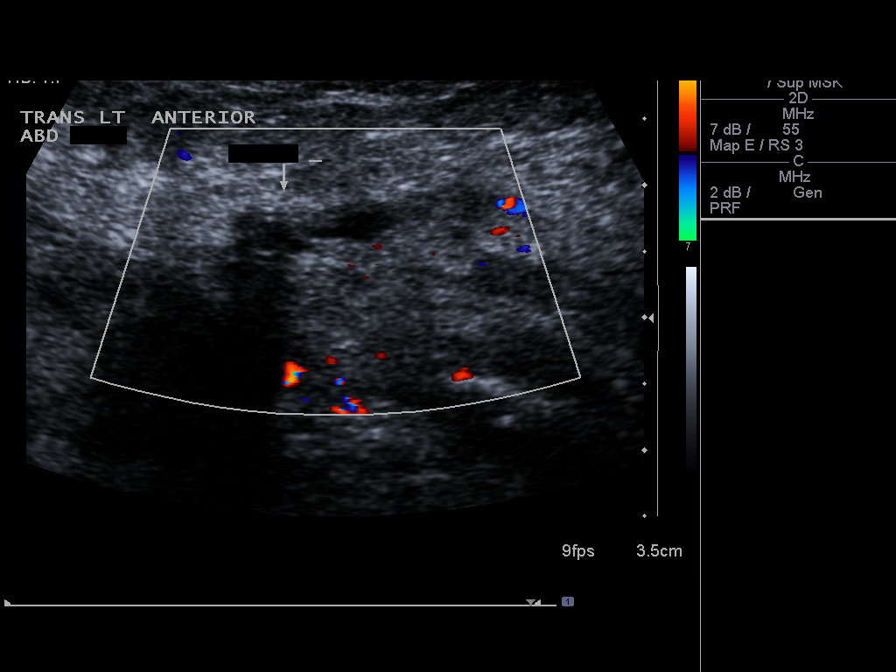
[im 9/9]
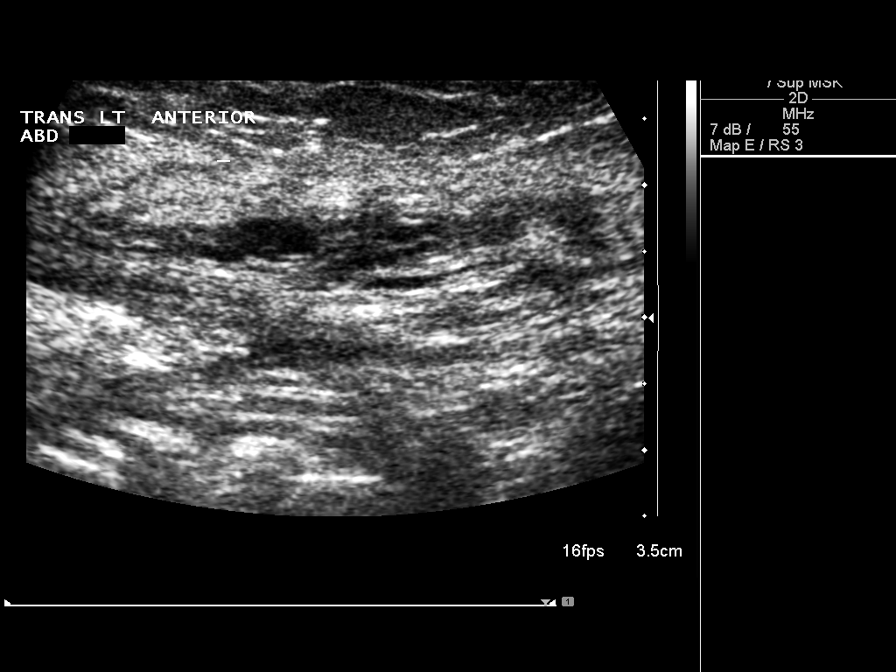

[9 of 9 positions shown; findings below may reference images not displayed]

FINDINGS: Within the subcutaneous tissues of the left anterior abdominal
wall, there is minimal residual fluid measuring 5.1 x 1.1 x 0.3 cm.
A drain is in place adjacent to the fluid collection.
IMPRESSION: Minimal residual curvilinear fluid adjacent to a previously placed
abdominal wall drain.

## 2014-12-07 ENCOUNTER — Other Ambulatory Visit: Payer: Self-pay | Admitting: Urology

## 2014-12-07 DIAGNOSIS — D49519 Neoplasm of unspecified behavior of unspecified kidney: Secondary | ICD-10-CM

## 2014-12-19 ENCOUNTER — Ambulatory Visit (HOSPITAL_COMMUNITY)
Admission: RE | Admit: 2014-12-19 | Discharge: 2014-12-19 | Disposition: A | Discharge status: Home / Self Care | Payer: Medicare Other | Source: Ambulatory Visit | Attending: Urology | Admitting: Urology

## 2014-12-19 DIAGNOSIS — D49519 Neoplasm of unspecified behavior of unspecified kidney: Secondary | ICD-10-CM | POA: Diagnosis present

## 2014-12-19 MED ORDER — GADOBENATE DIMEGLUMINE 529 MG/ML IV SOLN
15.0000 mL | Freq: Once | INTRAVENOUS | Status: AC | PRN
  Administered 2014-12-19: 11 mL via INTRAVENOUS

## 2015-02-03 DIAGNOSIS — R3989 Other symptoms and signs involving the genitourinary system: Secondary | ICD-10-CM | POA: Diagnosis not present

## 2015-02-03 DIAGNOSIS — N390 Urinary tract infection, site not specified: Secondary | ICD-10-CM | POA: Diagnosis not present

## 2015-02-06 DIAGNOSIS — G35 Multiple sclerosis: Secondary | ICD-10-CM | POA: Diagnosis not present

## 2015-02-06 DIAGNOSIS — Z885 Allergy status to narcotic agent status: Secondary | ICD-10-CM | POA: Diagnosis not present

## 2015-03-16 DIAGNOSIS — R350 Frequency of micturition: Secondary | ICD-10-CM | POA: Diagnosis not present

## 2015-03-16 DIAGNOSIS — N390 Urinary tract infection, site not specified: Secondary | ICD-10-CM | POA: Diagnosis not present

## 2015-03-30 DIAGNOSIS — Z1231 Encounter for screening mammogram for malignant neoplasm of breast: Secondary | ICD-10-CM | POA: Diagnosis not present

## 2015-05-30 DIAGNOSIS — R300 Dysuria: Secondary | ICD-10-CM | POA: Diagnosis not present

## 2015-05-30 DIAGNOSIS — N390 Urinary tract infection, site not specified: Secondary | ICD-10-CM | POA: Diagnosis not present

## 2015-09-18 DIAGNOSIS — R350 Frequency of micturition: Secondary | ICD-10-CM | POA: Diagnosis not present

## 2015-09-18 DIAGNOSIS — N3001 Acute cystitis with hematuria: Secondary | ICD-10-CM | POA: Diagnosis not present

## 2015-09-18 DIAGNOSIS — R300 Dysuria: Secondary | ICD-10-CM | POA: Diagnosis not present

## 2015-10-12 DIAGNOSIS — R197 Diarrhea, unspecified: Secondary | ICD-10-CM | POA: Diagnosis not present

## 2015-11-06 DIAGNOSIS — Z23 Encounter for immunization: Secondary | ICD-10-CM | POA: Diagnosis not present

## 2015-11-06 DIAGNOSIS — N3000 Acute cystitis without hematuria: Secondary | ICD-10-CM | POA: Diagnosis not present

## 2015-11-15 DIAGNOSIS — Z885 Allergy status to narcotic agent status: Secondary | ICD-10-CM | POA: Diagnosis not present

## 2015-11-15 DIAGNOSIS — G35 Multiple sclerosis: Secondary | ICD-10-CM | POA: Diagnosis not present

## 2015-11-30 DIAGNOSIS — G35 Multiple sclerosis: Secondary | ICD-10-CM | POA: Diagnosis not present

## 2015-12-13 DIAGNOSIS — N3281 Overactive bladder: Secondary | ICD-10-CM | POA: Diagnosis not present

## 2015-12-13 DIAGNOSIS — N3020 Other chronic cystitis without hematuria: Secondary | ICD-10-CM | POA: Diagnosis not present

## 2015-12-13 DIAGNOSIS — R351 Nocturia: Secondary | ICD-10-CM | POA: Diagnosis not present

## 2015-12-15 DIAGNOSIS — D649 Anemia, unspecified: Secondary | ICD-10-CM | POA: Diagnosis not present

## 2016-01-10 DIAGNOSIS — N3020 Other chronic cystitis without hematuria: Secondary | ICD-10-CM | POA: Diagnosis not present

## 2016-01-10 DIAGNOSIS — R351 Nocturia: Secondary | ICD-10-CM | POA: Diagnosis not present

## 2016-01-10 DIAGNOSIS — N3281 Overactive bladder: Secondary | ICD-10-CM | POA: Diagnosis not present

## 2016-02-15 DIAGNOSIS — N3020 Other chronic cystitis without hematuria: Secondary | ICD-10-CM | POA: Diagnosis not present

## 2016-02-15 DIAGNOSIS — R351 Nocturia: Secondary | ICD-10-CM | POA: Diagnosis not present

## 2016-02-15 DIAGNOSIS — N3281 Overactive bladder: Secondary | ICD-10-CM | POA: Diagnosis not present

## 2016-03-04 DIAGNOSIS — N3281 Overactive bladder: Secondary | ICD-10-CM | POA: Diagnosis not present

## 2016-03-04 DIAGNOSIS — N3020 Other chronic cystitis without hematuria: Secondary | ICD-10-CM | POA: Diagnosis not present

## 2016-04-10 DIAGNOSIS — D49511 Neoplasm of unspecified behavior of right kidney: Secondary | ICD-10-CM | POA: Diagnosis not present

## 2016-04-10 DIAGNOSIS — N3281 Overactive bladder: Secondary | ICD-10-CM | POA: Diagnosis not present

## 2016-04-10 DIAGNOSIS — R351 Nocturia: Secondary | ICD-10-CM | POA: Diagnosis not present

## 2016-04-10 DIAGNOSIS — R350 Frequency of micturition: Secondary | ICD-10-CM | POA: Diagnosis not present

## 2016-04-18 DIAGNOSIS — R300 Dysuria: Secondary | ICD-10-CM | POA: Diagnosis not present

## 2016-04-18 DIAGNOSIS — R350 Frequency of micturition: Secondary | ICD-10-CM | POA: Diagnosis not present

## 2016-04-18 DIAGNOSIS — R351 Nocturia: Secondary | ICD-10-CM | POA: Diagnosis not present

## 2016-05-15 ENCOUNTER — Other Ambulatory Visit: Payer: Self-pay | Admitting: Urology

## 2016-05-15 DIAGNOSIS — N3010 Interstitial cystitis (chronic) without hematuria: Secondary | ICD-10-CM | POA: Diagnosis not present

## 2016-05-15 DIAGNOSIS — N3020 Other chronic cystitis without hematuria: Secondary | ICD-10-CM | POA: Diagnosis not present

## 2016-05-17 DIAGNOSIS — R300 Dysuria: Secondary | ICD-10-CM | POA: Diagnosis not present

## 2016-05-29 DIAGNOSIS — N3001 Acute cystitis with hematuria: Secondary | ICD-10-CM | POA: Diagnosis not present

## 2016-05-29 DIAGNOSIS — R310 Gross hematuria: Secondary | ICD-10-CM | POA: Diagnosis not present

## 2016-06-04 DIAGNOSIS — N3020 Other chronic cystitis without hematuria: Secondary | ICD-10-CM | POA: Diagnosis not present

## 2016-06-04 DIAGNOSIS — R351 Nocturia: Secondary | ICD-10-CM | POA: Diagnosis not present

## 2016-07-03 DIAGNOSIS — R351 Nocturia: Secondary | ICD-10-CM | POA: Diagnosis not present

## 2016-07-03 DIAGNOSIS — N3020 Other chronic cystitis without hematuria: Secondary | ICD-10-CM | POA: Diagnosis not present

## 2016-07-17 DIAGNOSIS — N3281 Overactive bladder: Secondary | ICD-10-CM | POA: Diagnosis not present

## 2016-07-17 DIAGNOSIS — N3020 Other chronic cystitis without hematuria: Secondary | ICD-10-CM | POA: Diagnosis not present

## 2016-07-23 DIAGNOSIS — R351 Nocturia: Secondary | ICD-10-CM | POA: Diagnosis not present

## 2016-07-23 DIAGNOSIS — N3281 Overactive bladder: Secondary | ICD-10-CM | POA: Diagnosis not present

## 2016-07-23 DIAGNOSIS — R350 Frequency of micturition: Secondary | ICD-10-CM | POA: Diagnosis not present

## 2016-08-02 DIAGNOSIS — N3020 Other chronic cystitis without hematuria: Secondary | ICD-10-CM | POA: Diagnosis not present

## 2016-08-02 DIAGNOSIS — N3281 Overactive bladder: Secondary | ICD-10-CM | POA: Diagnosis not present

## 2016-08-02 DIAGNOSIS — R351 Nocturia: Secondary | ICD-10-CM | POA: Diagnosis not present

## 2016-08-02 DIAGNOSIS — N3941 Urge incontinence: Secondary | ICD-10-CM | POA: Diagnosis not present

## 2016-08-13 DIAGNOSIS — N3941 Urge incontinence: Secondary | ICD-10-CM | POA: Diagnosis not present

## 2016-08-21 DIAGNOSIS — N3941 Urge incontinence: Secondary | ICD-10-CM | POA: Diagnosis not present

## 2016-09-03 DIAGNOSIS — Z23 Encounter for immunization: Secondary | ICD-10-CM | POA: Diagnosis not present

## 2016-09-03 DIAGNOSIS — Z0000 Encounter for general adult medical examination without abnormal findings: Secondary | ICD-10-CM | POA: Diagnosis not present

## 2016-09-12 DIAGNOSIS — R350 Frequency of micturition: Secondary | ICD-10-CM | POA: Diagnosis not present

## 2016-09-12 DIAGNOSIS — N289 Disorder of kidney and ureter, unspecified: Secondary | ICD-10-CM | POA: Diagnosis not present

## 2016-09-12 DIAGNOSIS — N3020 Other chronic cystitis without hematuria: Secondary | ICD-10-CM | POA: Diagnosis not present

## 2016-09-12 DIAGNOSIS — R351 Nocturia: Secondary | ICD-10-CM | POA: Diagnosis not present

## 2016-10-01 DIAGNOSIS — Z1231 Encounter for screening mammogram for malignant neoplasm of breast: Secondary | ICD-10-CM | POA: Diagnosis not present

## 2016-10-02 DIAGNOSIS — R300 Dysuria: Secondary | ICD-10-CM | POA: Diagnosis not present

## 2016-10-02 DIAGNOSIS — R351 Nocturia: Secondary | ICD-10-CM | POA: Diagnosis not present

## 2016-10-24 DIAGNOSIS — R351 Nocturia: Secondary | ICD-10-CM | POA: Diagnosis not present

## 2016-10-24 DIAGNOSIS — N3020 Other chronic cystitis without hematuria: Secondary | ICD-10-CM | POA: Diagnosis not present

## 2016-10-24 DIAGNOSIS — R300 Dysuria: Secondary | ICD-10-CM | POA: Diagnosis not present

## 2016-11-12 DIAGNOSIS — R300 Dysuria: Secondary | ICD-10-CM | POA: Diagnosis not present

## 2016-11-12 DIAGNOSIS — N3020 Other chronic cystitis without hematuria: Secondary | ICD-10-CM | POA: Diagnosis not present

## 2016-11-18 DIAGNOSIS — G35 Multiple sclerosis: Secondary | ICD-10-CM | POA: Diagnosis not present

## 2016-11-18 DIAGNOSIS — Z23 Encounter for immunization: Secondary | ICD-10-CM | POA: Diagnosis not present

## 2016-11-27 DIAGNOSIS — N3020 Other chronic cystitis without hematuria: Secondary | ICD-10-CM | POA: Diagnosis not present

## 2016-11-27 DIAGNOSIS — R300 Dysuria: Secondary | ICD-10-CM | POA: Diagnosis not present

## 2016-12-06 DIAGNOSIS — N3020 Other chronic cystitis without hematuria: Secondary | ICD-10-CM | POA: Diagnosis not present

## 2016-12-06 DIAGNOSIS — R300 Dysuria: Secondary | ICD-10-CM | POA: Diagnosis not present

## 2016-12-06 DIAGNOSIS — R351 Nocturia: Secondary | ICD-10-CM | POA: Diagnosis not present

## 2016-12-27 DIAGNOSIS — R351 Nocturia: Secondary | ICD-10-CM | POA: Diagnosis not present

## 2016-12-27 DIAGNOSIS — R300 Dysuria: Secondary | ICD-10-CM | POA: Diagnosis not present

## 2016-12-27 DIAGNOSIS — N3020 Other chronic cystitis without hematuria: Secondary | ICD-10-CM | POA: Diagnosis not present

## 2017-01-13 DIAGNOSIS — R300 Dysuria: Secondary | ICD-10-CM | POA: Diagnosis not present

## 2017-01-24 DIAGNOSIS — R300 Dysuria: Secondary | ICD-10-CM | POA: Diagnosis not present

## 2017-01-24 DIAGNOSIS — N952 Postmenopausal atrophic vaginitis: Secondary | ICD-10-CM | POA: Diagnosis not present

## 2017-01-24 DIAGNOSIS — N3020 Other chronic cystitis without hematuria: Secondary | ICD-10-CM | POA: Diagnosis not present

## 2017-04-02 DIAGNOSIS — R351 Nocturia: Secondary | ICD-10-CM | POA: Diagnosis not present

## 2017-04-02 DIAGNOSIS — N3020 Other chronic cystitis without hematuria: Secondary | ICD-10-CM | POA: Diagnosis not present

## 2017-04-29 DIAGNOSIS — R300 Dysuria: Secondary | ICD-10-CM | POA: Diagnosis not present

## 2017-04-29 DIAGNOSIS — N3020 Other chronic cystitis without hematuria: Secondary | ICD-10-CM | POA: Diagnosis not present

## 2017-07-25 DIAGNOSIS — R351 Nocturia: Secondary | ICD-10-CM | POA: Diagnosis not present

## 2017-07-25 DIAGNOSIS — N3020 Other chronic cystitis without hematuria: Secondary | ICD-10-CM | POA: Diagnosis not present

## 2017-10-31 DIAGNOSIS — M25561 Pain in right knee: Secondary | ICD-10-CM | POA: Diagnosis not present

## 2017-10-31 DIAGNOSIS — M1711 Unilateral primary osteoarthritis, right knee: Secondary | ICD-10-CM | POA: Diagnosis not present

## 2017-11-05 DIAGNOSIS — G35 Multiple sclerosis: Secondary | ICD-10-CM | POA: Diagnosis not present

## 2017-11-05 DIAGNOSIS — Z87898 Personal history of other specified conditions: Secondary | ICD-10-CM | POA: Diagnosis not present

## 2017-11-05 DIAGNOSIS — Z9889 Other specified postprocedural states: Secondary | ICD-10-CM | POA: Diagnosis not present

## 2017-11-05 DIAGNOSIS — T375X5S Adverse effect of antiviral drugs, sequela: Secondary | ICD-10-CM | POA: Diagnosis not present

## 2017-11-11 DIAGNOSIS — Z23 Encounter for immunization: Secondary | ICD-10-CM | POA: Diagnosis not present

## 2017-11-24 DIAGNOSIS — Z1231 Encounter for screening mammogram for malignant neoplasm of breast: Secondary | ICD-10-CM | POA: Diagnosis not present

## 2017-12-01 DIAGNOSIS — M1711 Unilateral primary osteoarthritis, right knee: Secondary | ICD-10-CM | POA: Diagnosis not present

## 2017-12-12 DIAGNOSIS — M1711 Unilateral primary osteoarthritis, right knee: Secondary | ICD-10-CM | POA: Diagnosis not present

## 2017-12-23 DIAGNOSIS — N319 Neuromuscular dysfunction of bladder, unspecified: Secondary | ICD-10-CM | POA: Diagnosis not present

## 2017-12-23 DIAGNOSIS — N3000 Acute cystitis without hematuria: Secondary | ICD-10-CM | POA: Diagnosis not present

## 2017-12-31 DIAGNOSIS — M1711 Unilateral primary osteoarthritis, right knee: Secondary | ICD-10-CM | POA: Diagnosis not present

## 2018-02-02 DIAGNOSIS — N3000 Acute cystitis without hematuria: Secondary | ICD-10-CM | POA: Diagnosis not present

## 2018-02-02 DIAGNOSIS — N319 Neuromuscular dysfunction of bladder, unspecified: Secondary | ICD-10-CM | POA: Diagnosis not present

## 2018-02-02 DIAGNOSIS — N3020 Other chronic cystitis without hematuria: Secondary | ICD-10-CM | POA: Diagnosis not present

## 2018-02-02 DIAGNOSIS — R300 Dysuria: Secondary | ICD-10-CM | POA: Diagnosis not present

## 2018-03-09 DIAGNOSIS — N3020 Other chronic cystitis without hematuria: Secondary | ICD-10-CM | POA: Diagnosis not present

## 2018-03-09 DIAGNOSIS — N312 Flaccid neuropathic bladder, not elsewhere classified: Secondary | ICD-10-CM | POA: Diagnosis not present

## 2018-04-01 DIAGNOSIS — M1711 Unilateral primary osteoarthritis, right knee: Secondary | ICD-10-CM | POA: Diagnosis not present

## 2018-04-01 DIAGNOSIS — M25561 Pain in right knee: Secondary | ICD-10-CM | POA: Diagnosis not present

## 2018-05-05 DIAGNOSIS — R300 Dysuria: Secondary | ICD-10-CM | POA: Diagnosis not present

## 2018-05-05 DIAGNOSIS — N319 Neuromuscular dysfunction of bladder, unspecified: Secondary | ICD-10-CM | POA: Diagnosis not present

## 2018-05-05 DIAGNOSIS — N3020 Other chronic cystitis without hematuria: Secondary | ICD-10-CM | POA: Diagnosis not present

## 2018-05-05 DIAGNOSIS — R351 Nocturia: Secondary | ICD-10-CM | POA: Diagnosis not present

## 2018-05-25 DIAGNOSIS — M25561 Pain in right knee: Secondary | ICD-10-CM | POA: Diagnosis not present

## 2018-05-25 DIAGNOSIS — M1711 Unilateral primary osteoarthritis, right knee: Secondary | ICD-10-CM | POA: Diagnosis not present

## 2018-06-05 DIAGNOSIS — M25561 Pain in right knee: Secondary | ICD-10-CM | POA: Diagnosis not present

## 2018-06-15 DIAGNOSIS — M25561 Pain in right knee: Secondary | ICD-10-CM | POA: Diagnosis not present

## 2018-06-15 DIAGNOSIS — M1711 Unilateral primary osteoarthritis, right knee: Secondary | ICD-10-CM | POA: Diagnosis not present

## 2018-07-01 DIAGNOSIS — S83282A Other tear of lateral meniscus, current injury, left knee, initial encounter: Secondary | ICD-10-CM | POA: Diagnosis not present

## 2018-07-01 DIAGNOSIS — S83242A Other tear of medial meniscus, current injury, left knee, initial encounter: Secondary | ICD-10-CM | POA: Diagnosis not present

## 2018-07-01 DIAGNOSIS — M1712 Unilateral primary osteoarthritis, left knee: Secondary | ICD-10-CM | POA: Diagnosis not present

## 2018-07-23 DIAGNOSIS — M94261 Chondromalacia, right knee: Secondary | ICD-10-CM | POA: Diagnosis not present

## 2018-07-23 DIAGNOSIS — S83271A Complex tear of lateral meniscus, current injury, right knee, initial encounter: Secondary | ICD-10-CM | POA: Diagnosis not present

## 2018-07-23 DIAGNOSIS — S83232A Complex tear of medial meniscus, current injury, left knee, initial encounter: Secondary | ICD-10-CM | POA: Diagnosis not present

## 2018-07-27 DIAGNOSIS — R2689 Other abnormalities of gait and mobility: Secondary | ICD-10-CM | POA: Diagnosis not present

## 2018-07-27 DIAGNOSIS — Z4789 Encounter for other orthopedic aftercare: Secondary | ICD-10-CM | POA: Diagnosis not present

## 2018-07-27 DIAGNOSIS — M6281 Muscle weakness (generalized): Secondary | ICD-10-CM | POA: Diagnosis not present

## 2018-07-30 DIAGNOSIS — R2689 Other abnormalities of gait and mobility: Secondary | ICD-10-CM | POA: Diagnosis not present

## 2018-07-30 DIAGNOSIS — Z4789 Encounter for other orthopedic aftercare: Secondary | ICD-10-CM | POA: Diagnosis not present

## 2018-07-30 DIAGNOSIS — M6281 Muscle weakness (generalized): Secondary | ICD-10-CM | POA: Diagnosis not present

## 2018-08-03 DIAGNOSIS — Z4789 Encounter for other orthopedic aftercare: Secondary | ICD-10-CM | POA: Diagnosis not present

## 2018-08-03 DIAGNOSIS — M6281 Muscle weakness (generalized): Secondary | ICD-10-CM | POA: Diagnosis not present

## 2018-08-03 DIAGNOSIS — R2689 Other abnormalities of gait and mobility: Secondary | ICD-10-CM | POA: Diagnosis not present

## 2018-08-05 DIAGNOSIS — Z4789 Encounter for other orthopedic aftercare: Secondary | ICD-10-CM | POA: Diagnosis not present

## 2018-08-05 DIAGNOSIS — M6281 Muscle weakness (generalized): Secondary | ICD-10-CM | POA: Diagnosis not present

## 2018-08-05 DIAGNOSIS — R2689 Other abnormalities of gait and mobility: Secondary | ICD-10-CM | POA: Diagnosis not present

## 2018-08-07 DIAGNOSIS — R300 Dysuria: Secondary | ICD-10-CM | POA: Diagnosis not present

## 2018-08-07 DIAGNOSIS — M6281 Muscle weakness (generalized): Secondary | ICD-10-CM | POA: Diagnosis not present

## 2018-08-07 DIAGNOSIS — Z4789 Encounter for other orthopedic aftercare: Secondary | ICD-10-CM | POA: Diagnosis not present

## 2018-08-07 DIAGNOSIS — R2689 Other abnormalities of gait and mobility: Secondary | ICD-10-CM | POA: Diagnosis not present

## 2018-08-07 DIAGNOSIS — R310 Gross hematuria: Secondary | ICD-10-CM | POA: Diagnosis not present

## 2018-08-07 DIAGNOSIS — N3000 Acute cystitis without hematuria: Secondary | ICD-10-CM | POA: Diagnosis not present

## 2018-08-10 DIAGNOSIS — R2689 Other abnormalities of gait and mobility: Secondary | ICD-10-CM | POA: Diagnosis not present

## 2018-08-10 DIAGNOSIS — M6281 Muscle weakness (generalized): Secondary | ICD-10-CM | POA: Diagnosis not present

## 2018-08-10 DIAGNOSIS — Z4789 Encounter for other orthopedic aftercare: Secondary | ICD-10-CM | POA: Diagnosis not present

## 2018-08-12 DIAGNOSIS — Z4789 Encounter for other orthopedic aftercare: Secondary | ICD-10-CM | POA: Diagnosis not present

## 2018-08-12 DIAGNOSIS — M6281 Muscle weakness (generalized): Secondary | ICD-10-CM | POA: Diagnosis not present

## 2018-08-12 DIAGNOSIS — R2689 Other abnormalities of gait and mobility: Secondary | ICD-10-CM | POA: Diagnosis not present

## 2018-08-13 DIAGNOSIS — M6281 Muscle weakness (generalized): Secondary | ICD-10-CM | POA: Diagnosis not present

## 2018-08-13 DIAGNOSIS — Z4789 Encounter for other orthopedic aftercare: Secondary | ICD-10-CM | POA: Diagnosis not present

## 2018-08-13 DIAGNOSIS — R2689 Other abnormalities of gait and mobility: Secondary | ICD-10-CM | POA: Diagnosis not present

## 2018-08-18 DIAGNOSIS — R2689 Other abnormalities of gait and mobility: Secondary | ICD-10-CM | POA: Diagnosis not present

## 2018-08-18 DIAGNOSIS — Z4789 Encounter for other orthopedic aftercare: Secondary | ICD-10-CM | POA: Diagnosis not present

## 2018-08-18 DIAGNOSIS — M6281 Muscle weakness (generalized): Secondary | ICD-10-CM | POA: Diagnosis not present

## 2018-08-19 DIAGNOSIS — Z4789 Encounter for other orthopedic aftercare: Secondary | ICD-10-CM | POA: Diagnosis not present

## 2018-08-19 DIAGNOSIS — R2689 Other abnormalities of gait and mobility: Secondary | ICD-10-CM | POA: Diagnosis not present

## 2018-08-19 DIAGNOSIS — N319 Neuromuscular dysfunction of bladder, unspecified: Secondary | ICD-10-CM | POA: Diagnosis not present

## 2018-08-19 DIAGNOSIS — M6281 Muscle weakness (generalized): Secondary | ICD-10-CM | POA: Diagnosis not present

## 2018-08-19 DIAGNOSIS — R310 Gross hematuria: Secondary | ICD-10-CM | POA: Diagnosis not present

## 2018-08-19 DIAGNOSIS — N210 Calculus in bladder: Secondary | ICD-10-CM | POA: Diagnosis not present

## 2018-08-19 DIAGNOSIS — N3000 Acute cystitis without hematuria: Secondary | ICD-10-CM | POA: Diagnosis not present

## 2018-08-21 DIAGNOSIS — M6281 Muscle weakness (generalized): Secondary | ICD-10-CM | POA: Diagnosis not present

## 2018-08-21 DIAGNOSIS — Z4789 Encounter for other orthopedic aftercare: Secondary | ICD-10-CM | POA: Diagnosis not present

## 2018-08-21 DIAGNOSIS — R2689 Other abnormalities of gait and mobility: Secondary | ICD-10-CM | POA: Diagnosis not present

## 2018-08-27 DIAGNOSIS — M6281 Muscle weakness (generalized): Secondary | ICD-10-CM | POA: Diagnosis not present

## 2018-08-27 DIAGNOSIS — R2689 Other abnormalities of gait and mobility: Secondary | ICD-10-CM | POA: Diagnosis not present

## 2018-08-27 DIAGNOSIS — Z4789 Encounter for other orthopedic aftercare: Secondary | ICD-10-CM | POA: Diagnosis not present

## 2018-09-01 DIAGNOSIS — Z4789 Encounter for other orthopedic aftercare: Secondary | ICD-10-CM | POA: Diagnosis not present

## 2018-09-01 DIAGNOSIS — M6281 Muscle weakness (generalized): Secondary | ICD-10-CM | POA: Diagnosis not present

## 2018-09-01 DIAGNOSIS — R2689 Other abnormalities of gait and mobility: Secondary | ICD-10-CM | POA: Diagnosis not present

## 2018-09-03 DIAGNOSIS — M6281 Muscle weakness (generalized): Secondary | ICD-10-CM | POA: Diagnosis not present

## 2018-09-03 DIAGNOSIS — Z4789 Encounter for other orthopedic aftercare: Secondary | ICD-10-CM | POA: Diagnosis not present

## 2018-09-03 DIAGNOSIS — R2689 Other abnormalities of gait and mobility: Secondary | ICD-10-CM | POA: Diagnosis not present

## 2018-09-08 DIAGNOSIS — Z4789 Encounter for other orthopedic aftercare: Secondary | ICD-10-CM | POA: Diagnosis not present

## 2018-09-08 DIAGNOSIS — M6281 Muscle weakness (generalized): Secondary | ICD-10-CM | POA: Diagnosis not present

## 2018-09-08 DIAGNOSIS — R2689 Other abnormalities of gait and mobility: Secondary | ICD-10-CM | POA: Diagnosis not present

## 2018-09-10 DIAGNOSIS — R2689 Other abnormalities of gait and mobility: Secondary | ICD-10-CM | POA: Diagnosis not present

## 2018-09-10 DIAGNOSIS — M6281 Muscle weakness (generalized): Secondary | ICD-10-CM | POA: Diagnosis not present

## 2018-09-10 DIAGNOSIS — Z4789 Encounter for other orthopedic aftercare: Secondary | ICD-10-CM | POA: Diagnosis not present

## 2018-09-15 DIAGNOSIS — Z4789 Encounter for other orthopedic aftercare: Secondary | ICD-10-CM | POA: Diagnosis not present

## 2018-09-15 DIAGNOSIS — M6281 Muscle weakness (generalized): Secondary | ICD-10-CM | POA: Diagnosis not present

## 2018-09-15 DIAGNOSIS — R2689 Other abnormalities of gait and mobility: Secondary | ICD-10-CM | POA: Diagnosis not present

## 2018-09-17 DIAGNOSIS — Z4789 Encounter for other orthopedic aftercare: Secondary | ICD-10-CM | POA: Diagnosis not present

## 2018-09-17 DIAGNOSIS — R2689 Other abnormalities of gait and mobility: Secondary | ICD-10-CM | POA: Diagnosis not present

## 2018-09-17 DIAGNOSIS — M6281 Muscle weakness (generalized): Secondary | ICD-10-CM | POA: Diagnosis not present

## 2018-09-21 DIAGNOSIS — R2689 Other abnormalities of gait and mobility: Secondary | ICD-10-CM | POA: Diagnosis not present

## 2018-09-21 DIAGNOSIS — M6281 Muscle weakness (generalized): Secondary | ICD-10-CM | POA: Diagnosis not present

## 2018-09-21 DIAGNOSIS — Z4789 Encounter for other orthopedic aftercare: Secondary | ICD-10-CM | POA: Diagnosis not present

## 2018-09-24 DIAGNOSIS — Z4789 Encounter for other orthopedic aftercare: Secondary | ICD-10-CM | POA: Diagnosis not present

## 2018-09-24 DIAGNOSIS — R2689 Other abnormalities of gait and mobility: Secondary | ICD-10-CM | POA: Diagnosis not present

## 2018-09-24 DIAGNOSIS — R350 Frequency of micturition: Secondary | ICD-10-CM | POA: Diagnosis not present

## 2018-09-24 DIAGNOSIS — M6281 Muscle weakness (generalized): Secondary | ICD-10-CM | POA: Diagnosis not present

## 2018-09-24 DIAGNOSIS — R300 Dysuria: Secondary | ICD-10-CM | POA: Diagnosis not present

## 2018-09-24 DIAGNOSIS — N3000 Acute cystitis without hematuria: Secondary | ICD-10-CM | POA: Diagnosis not present

## 2018-10-19 DIAGNOSIS — M1711 Unilateral primary osteoarthritis, right knee: Secondary | ICD-10-CM | POA: Diagnosis not present

## 2018-11-09 DIAGNOSIS — G35 Multiple sclerosis: Secondary | ICD-10-CM | POA: Diagnosis not present

## 2018-11-09 DIAGNOSIS — N319 Neuromuscular dysfunction of bladder, unspecified: Secondary | ICD-10-CM | POA: Diagnosis not present

## 2018-11-24 DIAGNOSIS — Z23 Encounter for immunization: Secondary | ICD-10-CM | POA: Diagnosis not present

## 2018-11-25 DIAGNOSIS — R82998 Other abnormal findings in urine: Secondary | ICD-10-CM | POA: Diagnosis not present

## 2018-11-25 DIAGNOSIS — N319 Neuromuscular dysfunction of bladder, unspecified: Secondary | ICD-10-CM | POA: Diagnosis not present

## 2018-12-03 ENCOUNTER — Other Ambulatory Visit: Payer: Self-pay | Admitting: *Deleted

## 2018-12-03 DIAGNOSIS — Z20822 Contact with and (suspected) exposure to covid-19: Secondary | ICD-10-CM

## 2018-12-04 LAB — NOVEL CORONAVIRUS, NAA: SARS-CoV-2, NAA: DETECTED — AB

## 2020-09-28 ENCOUNTER — Other Ambulatory Visit: Payer: Self-pay | Admitting: Family Medicine

## 2020-09-28 DIAGNOSIS — S22000D Wedge compression fracture of unspecified thoracic vertebra, subsequent encounter for fracture with routine healing: Secondary | ICD-10-CM

## 2020-09-28 DIAGNOSIS — M546 Pain in thoracic spine: Secondary | ICD-10-CM

## 2020-10-14 ENCOUNTER — Ambulatory Visit
Admission: RE | Admit: 2020-10-14 | Discharge: 2020-10-14 | Disposition: A | Discharge status: Home / Self Care | Payer: Medicare HMO | Source: Ambulatory Visit | Attending: Family Medicine | Admitting: Family Medicine

## 2020-10-14 DIAGNOSIS — M546 Pain in thoracic spine: Secondary | ICD-10-CM

## 2020-10-14 DIAGNOSIS — S22000D Wedge compression fracture of unspecified thoracic vertebra, subsequent encounter for fracture with routine healing: Secondary | ICD-10-CM

## 2021-02-08 ENCOUNTER — Encounter: Payer: Self-pay | Admitting: Emergency Medicine

## 2021-02-08 ENCOUNTER — Inpatient Hospital Stay (HOSPITAL_COMMUNITY)
Admission: EM | Admit: 2021-02-08 | Discharge: 2021-02-12 | DRG: 698 | Disposition: A | Discharge status: Home / Self Care | Payer: Medicare HMO | Source: Ambulatory Visit | Attending: Family Medicine | Admitting: Family Medicine

## 2021-02-08 ENCOUNTER — Emergency Department (HOSPITAL_COMMUNITY): Payer: Medicare HMO

## 2021-02-08 ENCOUNTER — Ambulatory Visit
Admission: EM | Admit: 2021-02-08 | Discharge: 2021-02-08 | Payer: Medicare HMO | Attending: Urgent Care | Admitting: Urgent Care

## 2021-02-08 ENCOUNTER — Other Ambulatory Visit: Payer: Self-pay

## 2021-02-08 DIAGNOSIS — E876 Hypokalemia: Secondary | ICD-10-CM | POA: Diagnosis present

## 2021-02-08 DIAGNOSIS — N3001 Acute cystitis with hematuria: Secondary | ICD-10-CM | POA: Diagnosis not present

## 2021-02-08 DIAGNOSIS — T83518A Infection and inflammatory reaction due to other urinary catheter, initial encounter: Principal | ICD-10-CM | POA: Diagnosis present

## 2021-02-08 DIAGNOSIS — R000 Tachycardia, unspecified: Secondary | ICD-10-CM | POA: Diagnosis not present

## 2021-02-08 DIAGNOSIS — Z79899 Other long term (current) drug therapy: Secondary | ICD-10-CM

## 2021-02-08 DIAGNOSIS — Z789 Other specified health status: Secondary | ICD-10-CM

## 2021-02-08 DIAGNOSIS — N3000 Acute cystitis without hematuria: Secondary | ICD-10-CM | POA: Diagnosis not present

## 2021-02-08 DIAGNOSIS — N390 Urinary tract infection, site not specified: Secondary | ICD-10-CM | POA: Diagnosis present

## 2021-02-08 DIAGNOSIS — N319 Neuromuscular dysfunction of bladder, unspecified: Secondary | ICD-10-CM | POA: Diagnosis present

## 2021-02-08 DIAGNOSIS — Y846 Urinary catheterization as the cause of abnormal reaction of the patient, or of later complication, without mention of misadventure at the time of the procedure: Secondary | ICD-10-CM | POA: Diagnosis present

## 2021-02-08 DIAGNOSIS — Z20822 Contact with and (suspected) exposure to covid-19: Secondary | ICD-10-CM | POA: Diagnosis present

## 2021-02-08 DIAGNOSIS — Z90710 Acquired absence of both cervix and uterus: Secondary | ICD-10-CM | POA: Diagnosis not present

## 2021-02-08 DIAGNOSIS — E871 Hypo-osmolality and hyponatremia: Secondary | ICD-10-CM | POA: Diagnosis present

## 2021-02-08 DIAGNOSIS — I10 Essential (primary) hypertension: Secondary | ICD-10-CM | POA: Diagnosis present

## 2021-02-08 DIAGNOSIS — R102 Pelvic and perineal pain: Secondary | ICD-10-CM | POA: Diagnosis not present

## 2021-02-08 DIAGNOSIS — A4159 Other Gram-negative sepsis: Secondary | ICD-10-CM | POA: Diagnosis present

## 2021-02-08 DIAGNOSIS — Z8249 Family history of ischemic heart disease and other diseases of the circulatory system: Secondary | ICD-10-CM | POA: Diagnosis not present

## 2021-02-08 DIAGNOSIS — G35 Multiple sclerosis: Secondary | ICD-10-CM | POA: Diagnosis present

## 2021-02-08 DIAGNOSIS — M419 Scoliosis, unspecified: Secondary | ICD-10-CM | POA: Diagnosis present

## 2021-02-08 DIAGNOSIS — Z823 Family history of stroke: Secondary | ICD-10-CM | POA: Diagnosis not present

## 2021-02-08 DIAGNOSIS — Z87440 Personal history of urinary (tract) infections: Secondary | ICD-10-CM

## 2021-02-08 DIAGNOSIS — A419 Sepsis, unspecified organism: Secondary | ICD-10-CM | POA: Diagnosis not present

## 2021-02-08 HISTORY — DX: Scoliosis, unspecified: M41.9

## 2021-02-08 LAB — POCT URINALYSIS DIP (MANUAL ENTRY)
Bilirubin, UA: NEGATIVE
Glucose, UA: NEGATIVE mg/dL
Nitrite, UA: NEGATIVE
Protein Ur, POC: >=300 mg/dL — AB
Spec Grav, UA: 1.015 (ref 1.010–1.025)
Urobilinogen, UA: 0.2 E.U./dL
pH, UA: 8.5 — AB (ref 5.0–8.0)

## 2021-02-08 LAB — COMPREHENSIVE METABOLIC PANEL
ALT: 19 U/L (ref 0–44)
AST: 24 U/L (ref 15–41)
Albumin: 4 g/dL (ref 3.5–5.0)
Alkaline Phosphatase: 54 U/L (ref 38–126)
Anion gap: 10 (ref 5–15)
BUN: 21 mg/dL (ref 8–23)
CO2: 25 mmol/L (ref 22–32)
Calcium: 9.1 mg/dL (ref 8.9–10.3)
Chloride: 99 mmol/L (ref 98–111)
Creatinine, Ser: 0.79 mg/dL (ref 0.44–1.00)
GFR, Estimated: >60 mL/min (ref >60)
Glucose, Bld: 116 mg/dL — ABNORMAL HIGH (ref 70–99)
Potassium: 3.6 mmol/L (ref 3.5–5.1)
Sodium: 134 mmol/L — ABNORMAL LOW (ref 135–145)
Total Bilirubin: 0.4 mg/dL (ref 0.3–1.2)
Total Protein: 6.9 g/dL (ref 6.5–8.1)

## 2021-02-08 LAB — CBC WITH DIFFERENTIAL/PLATELET
Abs Immature Granulocytes: 0.09 10*3/uL — ABNORMAL HIGH (ref 0.00–0.07)
Basophils Absolute: 0 10*3/uL (ref 0.0–0.1)
Basophils Relative: 0 %
Eosinophils Absolute: 0 10*3/uL (ref 0.0–0.5)
Eosinophils Relative: 0 %
HCT: 37 % (ref 36.0–46.0)
Hemoglobin: 12.3 g/dL (ref 12.0–15.0)
Immature Granulocytes: 1 %
Lymphocytes Relative: 5 %
Lymphs Abs: 0.9 10*3/uL (ref 0.7–4.0)
MCH: 34.2 pg — ABNORMAL HIGH (ref 26.0–34.0)
MCHC: 33.2 g/dL (ref 30.0–36.0)
MCV: 102.8 fL — ABNORMAL HIGH (ref 80.0–100.0)
Monocytes Absolute: 1.2 10*3/uL — ABNORMAL HIGH (ref 0.1–1.0)
Monocytes Relative: 6 %
Neutro Abs: 16.5 10*3/uL — ABNORMAL HIGH (ref 1.7–7.7)
Neutrophils Relative %: 88 %
Platelets: 272 10*3/uL (ref 150–400)
RBC: 3.6 MIL/uL — ABNORMAL LOW (ref 3.87–5.11)
RDW: 12 % (ref 11.5–15.5)
WBC: 18.7 10*3/uL — ABNORMAL HIGH (ref 4.0–10.5)
nRBC: 0 % (ref 0.0–0.2)

## 2021-02-08 LAB — URINALYSIS, ROUTINE W REFLEX MICROSCOPIC
Bilirubin Urine: NEGATIVE
Glucose, UA: NEGATIVE mg/dL
Ketones, ur: 5 mg/dL — AB
Nitrite: NEGATIVE
Protein, ur: 100 mg/dL — AB
RBC / HPF: >50 RBC/hpf — ABNORMAL HIGH (ref 0–5)
Specific Gravity, Urine: 1.017 (ref 1.005–1.030)
WBC, UA: >50 WBC/hpf — ABNORMAL HIGH (ref 0–5)
pH: 6 (ref 5.0–8.0)

## 2021-02-08 LAB — PROTIME-INR
INR: 1.1 (ref 0.8–1.2)
Prothrombin Time: 14 seconds (ref 11.4–15.2)

## 2021-02-08 LAB — RESP PANEL BY RT-PCR (FLU A&B, COVID) ARPGX2
Influenza A by PCR: NEGATIVE
Influenza B by PCR: NEGATIVE
SARS Coronavirus 2 by RT PCR: NEGATIVE

## 2021-02-08 LAB — LACTIC ACID, PLASMA: Lactic Acid, Venous: 0.8 mmol/L (ref 0.5–1.9)

## 2021-02-08 LAB — APTT: aPTT: 36 seconds (ref 24–36)

## 2021-02-08 MED ORDER — ACETAMINOPHEN 325 MG PO TABS
650.0000 mg | ORAL_TABLET | Freq: Four times a day (QID) | ORAL | Status: DC | PRN
  Administered 2021-02-08 – 2021-02-10 (×4): 650 mg via ORAL
  Filled 2021-02-08 (×4): qty 2

## 2021-02-08 MED ORDER — LACTATED RINGERS IV BOLUS (SEPSIS)
1000.0000 mL | Freq: Once | INTRAVENOUS | Status: AC
  Administered 2021-02-08: 1000 mL via INTRAVENOUS

## 2021-02-08 MED ORDER — LACTATED RINGERS IV BOLUS
500.0000 mL | Freq: Once | INTRAVENOUS | Status: AC
  Administered 2021-02-08: 500 mL via INTRAVENOUS

## 2021-02-08 MED ORDER — POLYETHYLENE GLYCOL 3350 17 G PO PACK: 17.0000 g | PACK | Freq: Every day | ORAL | Status: DC | PRN

## 2021-02-08 MED ORDER — POTASSIUM CHLORIDE IN NACL 20-0.9 MEQ/L-% IV SOLN: INTRAVENOUS | Status: AC

## 2021-02-08 MED ORDER — ONDANSETRON HCL 4 MG PO TABS: 4.0000 mg | ORAL_TABLET | Freq: Four times a day (QID) | ORAL | Status: DC | PRN

## 2021-02-08 MED ORDER — LACTATED RINGERS IV SOLN: INTRAVENOUS | Status: DC

## 2021-02-08 MED ORDER — SODIUM CHLORIDE 0.9 % IV SOLN
2.0000 g | INTRAVENOUS | Status: DC
  Administered 2021-02-08: 2 g via INTRAVENOUS
  Filled 2021-02-08: qty 20

## 2021-02-08 MED ORDER — IBUPROFEN 600 MG PO TABS
600.0000 mg | ORAL_TABLET | Freq: Once | ORAL | Status: AC
  Administered 2021-02-08: 600 mg via ORAL
  Filled 2021-02-08: qty 1

## 2021-02-08 MED ORDER — ONDANSETRON HCL 4 MG/2ML IJ SOLN: 4.0000 mg | Freq: Four times a day (QID) | INTRAMUSCULAR | Status: DC | PRN

## 2021-02-08 MED ORDER — ENOXAPARIN SODIUM 40 MG/0.4ML IJ SOSY
40.0000 mg | PREFILLED_SYRINGE | INTRAMUSCULAR | Status: DC
  Administered 2021-02-08 – 2021-02-11 (×4): 40 mg via SUBCUTANEOUS
  Filled 2021-02-08 (×4): qty 0.4

## 2021-02-08 MED ORDER — SODIUM CHLORIDE 0.9 % IV SOLN
1.0000 g | Freq: Three times a day (TID) | INTRAVENOUS | Status: DC
  Administered 2021-02-08 – 2021-02-12 (×11): 1 g via INTRAVENOUS
  Filled 2021-02-08 (×11): qty 1

## 2021-02-08 MED ORDER — ACETAMINOPHEN 650 MG RE SUPP
650.0000 mg | Freq: Four times a day (QID) | RECTAL | Status: DC | PRN
  Administered 2021-02-10: 650 mg via RECTAL
  Filled 2021-02-08: qty 1

## 2021-02-08 MED ORDER — ACETAMINOPHEN 325 MG PO TABS
650.0000 mg | ORAL_TABLET | Freq: Once | ORAL | Status: AC
  Administered 2021-02-08: 650 mg via ORAL
  Filled 2021-02-08: qty 2

## 2021-02-08 MED ORDER — OXYBUTYNIN CHLORIDE 5 MG PO TABS
5.0000 mg | ORAL_TABLET | Freq: Three times a day (TID) | ORAL | Status: DC
  Administered 2021-02-08 – 2021-02-12 (×11): 5 mg via ORAL
  Filled 2021-02-08 (×11): qty 1

## 2021-02-08 NOTE — ED Triage Notes (Signed)
States she  self catherizes

## 2021-02-08 NOTE — H&P (Addendum)
History and Physical    [name] DGL:[id number] DOB: [birth year] DOA: 02/08/2021  PCP: Aletha Halim., PA-C   Patient coming from: Home  I have personally briefly reviewed patient's old medical records in [place]  Chief Complaint: Pain with urination, , fever   HPI: [name] is a 66 y.o. female with medical history significant for multiple sclerosis, small bowel obstruction, scoliosis. Patient presented to ED with complaints of urinary frequency and pain with urination that started last night.  She also reports chills rigors and fevers.  1 episode of vomiting.  Patient self caths secondary to her multiple sclerosis, she reports seeing small amounts of blood occasionally.  Patient has been self cathing herself for about 8 years now.  She gets frequent UTIs.  For this she was placed on low-dose Keflex daily and she has been on this for years also.  She reports compliance with this.  She is usually prescribed Levaquin for her UTIs.  Last UTI was about 4 months ago, diagnosed and treated as outpatient.  She did better after her course of Levaquin. She denies difficulty breathing, no cough.  No chest pain.  No abdominal pain no loose stools.  ED Course: Patient 102.7.  Heart rate 103-142.  Respiratory rate 19-25.  Blood pressure systolic 518A to 416S.  Sats greater than 95%.  WBC 18.7.  Chest x-ray clear.  UA suggestive of UTI.  Lactic acid 0.8.  IV ceftriaxone 2 g daily started.  Hospitalist to admit.  Review of Systems: As per HPI all other systems reviewed and negative.  Past Medical History:  Diagnosis Date   Abdominal fluid collection 05/07/2012   Abdominal pain, RUQ (right upper quadrant) 06/15/2012   Abdominal wall fluid collections 06/15/2012   Headache(784.0)    "several/wk" (05/17/2012)   History of blood transfusion ~ 2012   "after multiple UTI's; got bladder infection; passed alot of blood" (05/05/2012)   History of recurrent UTIs    Hypertension     Multiple sclerosis ([place]) 01/28/1970   Neuromuscular disorder ([place])    MS   Scoliosis    Self-catheterizes urinary bladder ~ 2012   "sphincter in bladder doesn't work properly due to my MS" (05/17/2012   Shingles outbreak 08/29/2011   Small bowel obstruction ([place]) 06/17/2012    Past Surgical History:  Procedure Laterality Date   LAPAROTOMY N/A 06/17/2012   Procedure: EXPLORATORY LAPAROTOMY FOR  SMALL BOWEL RESECTION;  Surgeon: Harl Bowie, MD;  Location: [facility];  Service: General;  Laterality: N/A;   VAGINAL HYSTERECTOMY  2011   WOUND DEBRIDEMENT Bilateral 06/13/2012   Procedure: DEBRIDEMENT ABDOMINAL WOUND;  Surgeon: Stark Klein, MD;  Location: [facility];  Service: General;  Laterality: Bilateral;     reports that she has never smoked. She has never used smokeless tobacco. She reports that she does not drink alcohol and does not use drugs.  Allergies  Allergen Reactions   Oxycodone Nausea And Vomiting    Family History  Problem Relation Age of Onset   Heart disease Mother    Congestive Heart Failure Mother    Heart disease Father    Stroke Father    Heart disease Brother    Heart attack Brother     Prior to Admission medications   Medication Sig Start Date End Date Taking? Authorizing Provider  Aspirin-Acetaminophen-Caffeine (EXCEDRIN PO) Take by mouth.   Yes [provider]  Calcium Carbonate-Vitamin D (CALCIUM 600 + D PO) Take 1 tablet by mouth daily.  Yes [provider]  cephALEXin (KEFLEX) 250 MG capsule Take 250 mg by mouth daily.   Yes [provider]  Cholecalciferol 50 MCG (2000 UT) TABS D3 DOTS 50 mcg (2,000 unit) tablet   Yes [provider]  docusate sodium 100 MG CAPS Take 100 mg by mouth daily. Do not take if your having abdominal pain or nausea 08/06/13  Yes Thurnell Lose, MD  ferrous sulfate 325 (65 FE) MG tablet Take 325 mg by mouth daily with breakfast.   Yes [provider]  ibuprofen (ADVIL) 200 MG tablet  Take 400 mg by mouth every 6 (six) hours as needed.   Yes [provider]  Misc Natural Products (OSTEO BI-FLEX ADV JOINT SHIELD PO) Take 1 tablet by mouth daily.   Yes [provider]  Multiple Vitamin (MULTIVITAMIN) tablet Take 1 tablet by mouth daily.   Yes [provider]  oxybutynin (DITROPAN) 5 MG tablet Take 5 mg by mouth 3 (three) times daily.  06/02/12  Yes [provider]  ciprofloxacin (CIPRO) 500 MG tablet Take 1 tablet (500 mg total) by mouth 2 (two) times daily. Patient not taking: Reported on 02/08/2021 08/06/13   Thurnell Lose, MD  metroNIDAZOLE (FLAGYL) 500 MG tablet Take 1 tablet (500 mg total) by mouth 3 (three) times daily. Patient not taking: Reported on 02/08/2021 08/06/13   Thurnell Lose, MD  traMADol (ULTRAM) 50 MG tablet Take 1 tablet (50 mg total) by mouth every 6 (six) hours as needed. Patient not taking: Reported on 02/08/2021 08/04/13   Randal Buba, April, MD    Physical Exam: Vitals:   02/08/21 1700 02/08/21 1730 02/08/21 1830 02/08/21 1946  BP: 122/70 131/66 140/68 (!) 152/67  Pulse: (!) 106 (!) 103 (!) 106 (!) 103  Resp: 20 19 (!) 22 20  Temp:  98.2 F (36.8 C)  97.8 F (36.6 C)  TempSrc:  Oral  Oral  SpO2: 94% 96% 96% 98%  Weight:    51.2 kg  Height:    5\' 2"  (1.575 m)    Constitutional: NAD, calm, comfortable Vitals:   02/08/21 1700 02/08/21 1730 02/08/21 1830 02/08/21 1946  BP: 122/70 131/66 140/68 (!) 152/67  Pulse: (!) 106 (!) 103 (!) 106 (!) 103  Resp: 20 19 (!) 22 20  Temp:  98.2 F (36.8 C)  97.8 F (36.6 C)  TempSrc:  Oral  Oral  SpO2: 94% 96% 96% 98%  Weight:    51.2 kg  Height:    5\' 2"  (1.575 m)   Eyes: PERRL, lids and conjunctivae normal ENMT: Mucous membranes are dry  Neck: normal, supple, no masses, no thyromegaly Respiratory: clear to auscultation bilaterally, no wheezing, no crackles. Normal respiratory effort. No accessory muscle use.  Cardiovascular: Tachycardic, regular rate and rhythm, no  murmurs / rubs / gallops.  No edema.  Lower extremities warm. Abdomen: no tenderness, no masses palpated. No hepatosplenomegaly. Bowel sounds positive.  Musculoskeletal: no clubbing / cyanosis. No joint deformity upper and lower extremities. Good ROM, no contractures. Normal muscle tone.  Skin: no rashes, lesions, ulcers. No induration Neurologic: No apparent cranial nerve abnormality, 4+/5 strength in all extremities Psychiatric: Normal judgment and insight. Alert and oriented x 3. Normal mood.   Labs on Admission: I have personally reviewed following labs and imaging studies  CBC: Recent Labs  Lab 02/08/21 1510  WBC 18.7*  NEUTROABS 16.5*  HGB 12.3  HCT 37.0  MCV 102.8*  PLT 791   Basic Metabolic Panel: Recent  Labs  Lab 02/08/21 1510  NA 134*  K 3.6  CL 99  CO2 25  GLUCOSE 116*  BUN 21  CREATININE 0.79  CALCIUM 9.1   GFR: Estimated Creatinine Clearance: 54 mL/min (by C-G formula based on SCr of 0.79 mg/dL). Liver Function Tests: Recent Labs  Lab 02/08/21 1510  AST 24  ALT 19  ALKPHOS 54  BILITOT 0.4  PROT 6.9  ALBUMIN 4.0   Coagulation Profile: Recent Labs  Lab 02/08/21 1510  INR 1.1   Urine analysis:    Component Value Date/Time   COLORURINE YELLOW 02/08/2021 1543   APPEARANCEUR CLOUDY (A) 02/08/2021 1543   LABSPEC 1.017 02/08/2021 1543   PHURINE 6.0 02/08/2021 1543   GLUCOSEU NEGATIVE 02/08/2021 1543   HGBUR LARGE (A) 02/08/2021 1543   BILIRUBINUR NEGATIVE 02/08/2021 1543   BILIRUBINUR negative 02/08/2021 1231   KETONESUR 5 (A) 02/08/2021 1543   KETONESUR trace (5) (A) 02/08/2021 1231   PROTEINUR 100 (A) 02/08/2021 1543   PROTEINUR >=300 (A) 02/08/2021 1231   UROBILINOGEN 0.2 02/08/2021 1231   UROBILINOGEN 0.2 08/04/2013 0201   NITRITE NEGATIVE 02/08/2021 1543   NITRITE Negative 02/08/2021 1231   LEUKOCYTESUR LARGE (A) 02/08/2021 1543   LEUKOCYTESUR Small (1+) (A) 02/08/2021 1231    Radiological Exams on Admission: DG Chest Port 1  View  Result Date: 02/08/2021 CLINICAL DATA:  Fever.  Tachycardia.  UTI.  Question sepsis. EXAM: PORTABLE CHEST 1 VIEW COMPARISON:  09/07/2010 FINDINGS: Artifact overlies the chest. Heart size is normal. There is aortic atherosclerosis. The lungs are clear. The vascularity is normal. No effusions. There is curvature of the spine which could be positional. IMPRESSION: No active disease. Electronically Signed   By: Nelson Chimes M.D.   On: 02/08/2021 14:44    EKG: Pending.   Assessment/Plan Principal Problem:   Sepsis ([place]) Active Problems:   UTI (urinary tract infection)   Multiple sclerosis ([facility])   Self-catheterizes urinary bladder  Sepsis secondary to complicated urinary tract infection-meets criteria for sepsis with tachycardia heart rate 103-142.  Febrile to 102.7.  Leukocytosis of 18.7.  Normal lactic acid 0.8.  UA suggestive of UTI with large leukocytes greater than 50 WBC.  Few bacteria.  Chest x-ray clear. -Per Care Everywhere last urine culture 6 months ago revealed 50-100,000 CFU of E. Coli, resistant to multiple drugs.  Including resistance to cefazolin Levaquin, Unasyn.  Intermediate-ceftriaxone.  Sensitive to meropenem, Zosyn. -On chronic cephalexin 250mg  daily,  -Continue IV antibiotic coverage with IV meropenem - 1.5 L given, continue N/s + 20 kcl 100cc/hr x 20hrs -Follow-up blood and urine cultures -Resume oxybutynin - obtain ekg  Multiple sclerosis- not on medications.  Mostly affecting her bladder.  Most times needs a cane for assistance with ambulation.  DVT prophylaxis: Lovenox  Code Status: Full code Family Communication: Spouse at bedside Disposition Plan:  > 2 days, pending blood and urine culture results, resolution of SIRS physiology, Consults called: None Admission status: Inpt, tele I certify that at the point of admission it is my clinical judgment that the patient will require inpatient hospital care spanning beyond 2 midnights from the point of admission due  to high intensity of service, high risk for further deterioration and high frequency of surveillance required.    Bethena Roys MD [facility]  02/08/2021, 8:44 PM

## 2021-02-08 NOTE — Discharge Instructions (Signed)
You will need a higher level of care than we can provide in the urgent care setting. I am concerned that you have urosepsis given your fever, significant tachycardia at 148bpm and urinary symptoms with a history of UTIs. Please go straight to the hospital.

## 2021-02-08 NOTE — Progress Notes (Signed)
EKG done and given to nurse 

## 2021-02-08 NOTE — ED Triage Notes (Signed)
Referral from urgent care, patient has a fever

## 2021-02-08 NOTE — Progress Notes (Signed)
Notified bedside nurse of need to draw blood cultures.  

## 2021-02-08 NOTE — Progress Notes (Signed)
Elink following code sepsis °

## 2021-02-08 NOTE — Progress Notes (Signed)
Pharmacy Antibiotic Note  [name] is a 66 y.o. female admitted on 02/08/2021 with UTI.  Pharmacy has been consulted for merrem dosing.  Plan: Merrem 1 gram iv q8h  Height: 5\' 2"  (157.5 cm) Weight: 51.2 kg (112 lb 14 oz) IBW/kg (Calculated) : 50.1  Temp (24hrs), Avg:99.6 F (37.6 C), Min:97.8 F (36.6 C), Max:102.7 F (39.3 C)  Recent Labs  Lab 02/08/21 1510  WBC 18.7*  CREATININE 0.79  LATICACIDVEN 0.8    Estimated Creatinine Clearance: 54 mL/min (by C-G formula based on SCr of 0.79 mg/dL).    Allergies  Allergen Reactions   Oxycodone Nausea And Vomiting    Antimicrobials this admission: 1/12 CTX >> 1/12 1/12 merrem >>    Microbiology results: 1/12 BCx: in progress 1/12 UCx: in progess    Thank you for allowing pharmacy to be a part of this patients care.  Vaughan Basta BS, PharmD, BCPS Clinical Pharmacist 02/08/2021 8:37 PM

## 2021-02-08 NOTE — ED Provider Notes (Signed)
[facility] EMERGENCY DEPARTMENT Provider Note   CSN: [id number] Arrival date & time: 02/08/21  1324     History  Chief Complaint  Patient presents with   Fever    [name] is a 66 y.o. female.  HPI   Pt is a 66 y/o female with a h/o MS, recurrent UTIs, who presents to the ED today for eval of urinary frequency that started last night. She further c/o dysuria, hematuria, rigors, chills, fevers, nausea, and one episode of vomiting.  Denies abd pain, diarrhea, constipation, uri sxs, chest pain, sob.   She is on chronic keflex for UTI.   Home Medications Prior to Admission medications   Medication Sig Start Date End Date Taking? Authorizing Provider  Aspirin-Acetaminophen-Caffeine (EXCEDRIN PO) Take by mouth.   Yes [provider]  Calcium Carbonate-Vitamin D (CALCIUM 600 + D PO) Take 1 tablet by mouth daily.   Yes [provider]  cephALEXin (KEFLEX) 250 MG capsule Take 250 mg by mouth daily.   Yes [provider]  Cholecalciferol 50 MCG (2000 UT) TABS D3 DOTS 50 mcg (2,000 unit) tablet   Yes [provider]  docusate sodium 100 MG CAPS Take 100 mg by mouth daily. Do not take if your having abdominal pain or nausea 08/06/13  Yes Thurnell Lose, MD  ferrous sulfate 325 (65 FE) MG tablet Take 325 mg by mouth daily with breakfast.   Yes [provider]  ibuprofen (ADVIL) 200 MG tablet Take 400 mg by mouth every 6 (six) hours as needed.   Yes [provider]  Misc Natural Products (OSTEO BI-FLEX ADV JOINT SHIELD PO) Take 1 tablet by mouth daily.   Yes [provider]  Multiple Vitamin (MULTIVITAMIN) tablet Take 1 tablet by mouth daily.   Yes [provider]  oxybutynin (DITROPAN) 5 MG tablet Take 5 mg by mouth 3 (three) times daily.  06/02/12  Yes [provider]  ciprofloxacin (CIPRO) 500 MG tablet Take 1 tablet (500 mg total) by mouth 2 (two) times daily. Patient not taking: Reported on  02/08/2021 08/06/13   Thurnell Lose, MD  metroNIDAZOLE (FLAGYL) 500 MG tablet Take 1 tablet (500 mg total) by mouth 3 (three) times daily. Patient not taking: Reported on 02/08/2021 08/06/13   Thurnell Lose, MD  traMADol (ULTRAM) 50 MG tablet Take 1 tablet (50 mg total) by mouth every 6 (six) hours as needed. Patient not taking: Reported on 02/08/2021 08/04/13   Randal Buba, April, MD      Allergies    Oxycodone    Review of Systems   Review of Systems  Constitutional:  Positive for chills.  HENT:  Negative for ear pain and sore throat.   Eyes:  Negative for pain and visual disturbance.  Respiratory:  Negative for cough and shortness of breath.   Cardiovascular:  Negative for chest pain.  Gastrointestinal:  Positive for nausea. Negative for abdominal pain, constipation, diarrhea and vomiting.  Genitourinary:  Positive for dysuria, frequency, hematuria and urgency.  Musculoskeletal:  Negative for back pain.  Skin:  Negative for rash.  Neurological:  Negative for headaches.  All other systems reviewed and are negative.  Physical Exam Updated Vital Signs BP 131/66    Pulse (!) 103    Temp 98.2 F (36.8 C) (Oral)    Resp 19    SpO2 96%  Physical Exam Vitals and nursing note reviewed.  Constitutional:      General: She is not in acute distress.  Appearance: She is well-developed.  HENT:     Head: Normocephalic and atraumatic.  Eyes:     Conjunctiva/sclera: Conjunctivae normal.  Cardiovascular:     Rate and Rhythm: Regular rhythm. Tachycardia present.     Heart sounds: Normal heart sounds. No murmur heard. Pulmonary:     Effort: No respiratory distress.     Breath sounds: Normal breath sounds. No wheezing, rhonchi or rales.     Comments: tachypneic Abdominal:     General: Bowel sounds are normal.     Palpations: Abdomen is soft.     Tenderness: There is no abdominal tenderness. There is no guarding or rebound.  Musculoskeletal:        General: No swelling.     Cervical  back: Neck supple.  Skin:    General: Skin is warm and dry.     Capillary Refill: Capillary refill takes less than 2 seconds.  Neurological:     Mental Status: She is alert.  Psychiatric:        Mood and Affect: Mood normal.    ED Results / Procedures / Treatments   Labs (all labs ordered are listed, but only abnormal results are displayed) Labs Reviewed  COMPREHENSIVE METABOLIC PANEL - Abnormal; Notable for the following components:      Result Value   Sodium 134 (*)    Glucose, Bld 116 (*)    All other components within normal limits  CBC WITH DIFFERENTIAL/PLATELET - Abnormal; Notable for the following components:   WBC 18.7 (*)    RBC 3.60 (*)    MCV 102.8 (*)    MCH 34.2 (*)    Neutro Abs 16.5 (*)    Monocytes Absolute 1.2 (*)    Abs Immature Granulocytes 0.09 (*)    All other components within normal limits  URINALYSIS, ROUTINE W REFLEX MICROSCOPIC - Abnormal; Notable for the following components:   APPearance CLOUDY (*)    Hgb urine dipstick LARGE (*)    Ketones, ur 5 (*)    Protein, ur 100 (*)    Leukocytes,Ua LARGE (*)    RBC / HPF >50 (*)    WBC, UA >50 (*)    Bacteria, UA FEW (*)    All other components within normal limits  RESP PANEL BY RT-PCR (FLU A&B, COVID) ARPGX2  CULTURE, BLOOD (ROUTINE X 2)  CULTURE, BLOOD (ROUTINE X 2)  URINE CULTURE  LACTIC ACID, PLASMA  PROTIME-INR  APTT  LACTIC ACID, PLASMA    EKG None  Radiology DG Chest Port 1 View  Result Date: 02/08/2021 CLINICAL DATA:  Fever.  Tachycardia.  UTI.  Question sepsis. EXAM: PORTABLE CHEST 1 VIEW COMPARISON:  09/07/2010 FINDINGS: Artifact overlies the chest. Heart size is normal. There is aortic atherosclerosis. The lungs are clear. The vascularity is normal. No effusions. There is curvature of the spine which could be positional. IMPRESSION: No active disease. Electronically Signed   By: Nelson Chimes M.D.   On: 02/08/2021 14:44    Procedures Procedures   6:13 PM Cardiac monitoring  reveals sinus tach, HR 130s (Rate & rhythm), as reviewed and interpreted by me. Cardiac monitoring was ordered due to tachycardia and to monitor patient for dysrhythmia.   Medications Ordered in ED Medications  lactated ringers infusion ( Intravenous New Bag/Given 02/08/21 1515)  cefTRIAXone (ROCEPHIN) 2 g in sodium chloride 0.9 % 100 mL IVPB (0 g Intravenous Stopped 02/08/21 1544)  lactated ringers bolus 1,000 mL (0 mLs Intravenous Stopped 02/08/21 1616)  acetaminophen (TYLENOL) tablet  650 mg (650 mg Oral Given 02/08/21 1520)    ED Course/ Medical Decision Making/ A&P                           Medical Decision Making  66 y/o female presenting for eval of urinary sxs, fevers and tachycardia  Reviewed/interpreted labs CBC with leukocytosis of 18k, no anemia CMP with mild hyponatremia, otherwise reassuring Coags wnl COVID/flu neg UA consistent with UTI. Urine culture sent.  - pt attempted self cath in the dept and was noted to not be doing this sterilely  Lactic negative Blood cultures obtained  EKG - pending on admission  Reviewed/interpreted imaging CXR - No active disease  Pt with tachycardia, tachypnea, fever and leukocytosis with evidence of UTI. Meets sepsis criteria. She was started on IVF, abx, and antipyretics. On reassessment she reports some improvement of sxs. We discussed plan for admission for iv abx and further monitoring. She is agreeable  5:29 PM CONSULT with Dr. Denton Brick who accepts patient for admission  Final Clinical Impression(s) / ED Diagnoses Final diagnoses:  Sepsis, due to unspecified organism, unspecified whether acute organ dysfunction present [facility])  Acute cystitis without hematuria    Rx / DC Orders ED Discharge Orders     None         Bishop Dublin 02/08/21 1813    Isla Pence, MD 02/10/21 [phone]

## 2021-02-08 NOTE — ED Triage Notes (Signed)
Chills, nauseated, hip  pain, when using catheter to urinate since yesterday.

## 2021-02-08 NOTE — ED Notes (Signed)
Patient is being discharged from the Urgent Care and sent to the Emergency Department via private vehicle . Per PA, patient is in need of higher level of care due to possible urosepsis. Patient is aware and verbalizes understanding of plan of care.  Vitals:   02/08/21 1228  BP: (!) 176/103  Pulse: (!) 136  Resp: 18  Temp: 99.8 F (37.7 C)  SpO2: 95%

## 2021-02-08 NOTE — ED Provider Notes (Signed)
[place]   MRN: [id number] DOB: 06/09/1955  Subjective:   [name] is a 66 y.o. female presenting for 1 day history of acute onset vaginal pain, nausea, fever, chills, shaking.  She has had a history of scoliosis and multiple sclerosis, has been having hip pain from this which is not new for her but is worsening.  No history of kidney stones.  Patient does do self cathing at home.  No current facility-administered medications for this encounter.  Current Outpatient Medications:    cephALEXin (KEFLEX) 250 MG capsule, Take by mouth once., Disp: , Rfl:    aspirin-acetaminophen-caffeine (EXCEDRIN MIGRAINE) 211-941-74 MG per tablet, Take 2 tablets by mouth every 6 (six) hours as needed for pain., Disp: , Rfl:    Calcium Carbonate-Vitamin D (CALCIUM 600 + D PO), Take 1 tablet by mouth daily., Disp: , Rfl:    ciprofloxacin (CIPRO) 500 MG tablet, Take 1 tablet (500 mg total) by mouth 2 (two) times daily., Disp: 14 tablet, Rfl: 0   docusate sodium 100 MG CAPS, Take 100 mg by mouth daily. Do not take if your having abdominal pain or nausea, Disp: 10 capsule, Rfl: 0   ferrous sulfate 325 (65 FE) MG tablet, Take 325 mg by mouth daily with breakfast., Disp: , Rfl:    lisinopril (PRINIVIL,ZESTRIL) 10 MG tablet, Take 10 mg by mouth daily., Disp: , Rfl:    metroNIDAZOLE (FLAGYL) 500 MG tablet, Take 1 tablet (500 mg total) by mouth 3 (three) times daily., Disp: 21 tablet, Rfl: 0   Misc Natural Products (OSTEO BI-FLEX ADV JOINT SHIELD PO), Take 1 tablet by mouth daily., Disp: , Rfl:    Multiple Vitamin (MULTIVITAMIN) tablet, Take 1 tablet by mouth daily., Disp: , Rfl:    oxybutynin (DITROPAN) 5 MG tablet, Take 5 mg by mouth 3 (three) times daily. , Disp: , Rfl:    traMADol (ULTRAM) 50 MG tablet, Take 1 tablet (50 mg total) by mouth every 6 (six) hours as needed., Disp: 15 tablet, Rfl: 0   Allergies  Allergen Reactions   Oxycodone Nausea And Vomiting    Past Medical History:   Diagnosis Date   Abdominal fluid collection 05/07/2012   Abdominal pain, RUQ (right upper quadrant) 06/15/2012   Abdominal wall fluid collections 06/15/2012   Headache(784.0)    "several/wk" (05/17/2012)   History of blood transfusion ~ 2012   "after multiple UTI's; got bladder infection; passed alot of blood" (05/05/2012)   History of recurrent UTIs    Hypertension    Multiple sclerosis ([place]) 01/28/1970   Neuromuscular disorder ([place])    MS   Scoliosis    Self-catheterizes urinary bladder ~ 2012   "sphincter in bladder doesn't work properly due to my MS" (05/17/2012   Shingles outbreak 08/29/2011   Small bowel obstruction ([place]) 06/17/2012     Past Surgical History:  Procedure Laterality Date   LAPAROTOMY N/A 06/17/2012   Procedure: EXPLORATORY LAPAROTOMY FOR  SMALL BOWEL RESECTION;  Surgeon: Harl Bowie, MD;  Location: [facility];  Service: General;  Laterality: N/A;   VAGINAL HYSTERECTOMY  2011   WOUND DEBRIDEMENT Bilateral 06/13/2012   Procedure: DEBRIDEMENT ABDOMINAL WOUND;  Surgeon: Stark Klein, MD;  Location: [facility];  Service: General;  Laterality: Bilateral;    Family History  Problem Relation Age of Onset   Heart disease Mother    Congestive Heart Failure Mother    Heart disease Father    Stroke Father    Heart disease Brother  Heart attack Brother     Social History   Tobacco Use   Smoking status: Never   Smokeless tobacco: Never  Substance Use Topics   Alcohol use: No   Drug use: No    ROS   Objective:   Vitals: BP (!) 176/103 (BP Location: Right Arm)    Pulse (!) 136    Temp 99.8 F (37.7 C) (Oral)    Resp 18    SpO2 95%   Wt Readings from Last 3 Encounters:  08/04/13 128 lb 4.9 oz (58.2 kg)  09/08/12 120 lb 3.2 oz (54.5 kg)  07/29/12 115 lb 12.8 oz (52.5 kg)   Temp Readings from Last 3 Encounters:  02/08/21 99.8 F (37.7 C) (Oral)  08/06/13 97.7 F (36.5 C) (Oral)  08/04/13 97.8 F (36.6 C) (Oral)   BP Readings from Last 3 Encounters:   02/08/21 (!) 176/103  08/06/13 (!) 127/49  08/04/13 136/60   Pulse Readings from Last 3 Encounters:  02/08/21 (!) 136  08/06/13 77  08/04/13 81   Physical Exam Constitutional:      General: She is not in acute distress.    Appearance: Normal appearance. She is well-developed. She is ill-appearing. She is not toxic-appearing or diaphoretic.  HENT:     Head: Normocephalic and atraumatic.     Nose: Nose normal.     Mouth/Throat:     Mouth: Mucous membranes are moist.     Pharynx: Oropharynx is clear.  Eyes:     General: No scleral icterus.       Right eye: No discharge.        Left eye: No discharge.     Extraocular Movements: Extraocular movements intact.     Conjunctiva/sclera: Conjunctivae normal.     Pupils: Pupils are equal, round, and reactive to light.  Cardiovascular:     Rate and Rhythm: Tachycardia present.  Pulmonary:     Effort: Pulmonary effort is normal.  Abdominal:     General: Bowel sounds are normal. There is no distension.     Palpations: Abdomen is soft. There is no mass.     Tenderness: There is no abdominal tenderness. There is no right CVA tenderness, left CVA tenderness, guarding or rebound.  Skin:    General: Skin is warm and dry.  Neurological:     General: No focal deficit present.     Mental Status: She is alert and oriented to person, place, and time.  Psychiatric:        Mood and Affect: Mood normal.        Behavior: Behavior normal.        Thought Content: Thought content normal.        Judgment: Judgment normal.    Results for orders placed or performed during the hospital encounter of 02/08/21 (from the past 24 hour(s))  POCT urinalysis dipstick     Status: Abnormal   Collection Time: 02/08/21 12:31 PM  Result Value Ref Range   Color, UA yellow yellow   Clarity, UA clear clear   Glucose, UA negative negative mg/dL   Bilirubin, UA negative negative   Ketones, POC UA trace (5) (A) negative mg/dL   Spec Grav, UA 1.015 1.010 - 1.025    Blood, UA large (A) negative   pH, UA 8.5 (A) 5.0 - 8.0   Protein Ur, POC >=300 (A) negative mg/dL   Urobilinogen, UA 0.2 0.2 or 1.0 E.U./dL   Nitrite, UA Negative Negative   Leukocytes, UA Small (1+) (  A) Negative    Assessment and Plan :   PDMP not reviewed this encounter.  1. Vaginal pain   2. Self-catheterizes urinary bladder   3. History of UTI   4. Tachycardia    Given patient's history of frequent UTIs and her tachycardia, current pain and urinalysis results recommended further evaluation in the emergency room for rule out of urosepsis.  Patient presents with her husband who is agreeable to this plan, will take her to the emergency room now.   Jaynee Eagles, PA-C 02/08/21 1313

## 2021-02-09 ENCOUNTER — Encounter (HOSPITAL_COMMUNITY): Payer: Self-pay | Admitting: Internal Medicine

## 2021-02-09 DIAGNOSIS — A419 Sepsis, unspecified organism: Secondary | ICD-10-CM

## 2021-02-09 DIAGNOSIS — N3001 Acute cystitis with hematuria: Secondary | ICD-10-CM

## 2021-02-09 DIAGNOSIS — Z789 Other specified health status: Secondary | ICD-10-CM

## 2021-02-09 DIAGNOSIS — G35 Multiple sclerosis: Secondary | ICD-10-CM

## 2021-02-09 LAB — BASIC METABOLIC PANEL
Anion gap: 7 (ref 5–15)
BUN: 13 mg/dL (ref 8–23)
CO2: 26 mmol/L (ref 22–32)
Calcium: 8.7 mg/dL — ABNORMAL LOW (ref 8.9–10.3)
Chloride: 105 mmol/L (ref 98–111)
Creatinine, Ser: 0.77 mg/dL (ref 0.44–1.00)
GFR, Estimated: >60 mL/min (ref >60)
Glucose, Bld: 95 mg/dL (ref 70–99)
Potassium: 3.1 mmol/L — ABNORMAL LOW (ref 3.5–5.1)
Sodium: 138 mmol/L (ref 135–145)

## 2021-02-09 LAB — CBC
HCT: 33.9 % — ABNORMAL LOW (ref 36.0–46.0)
Hemoglobin: 11.3 g/dL — ABNORMAL LOW (ref 12.0–15.0)
MCH: 34.5 pg — ABNORMAL HIGH (ref 26.0–34.0)
MCHC: 33.3 g/dL (ref 30.0–36.0)
MCV: 103.4 fL — ABNORMAL HIGH (ref 80.0–100.0)
Platelets: 217 10*3/uL (ref 150–400)
RBC: 3.28 MIL/uL — ABNORMAL LOW (ref 3.87–5.11)
RDW: 11.9 % (ref 11.5–15.5)
WBC: 16.9 10*3/uL — ABNORMAL HIGH (ref 4.0–10.5)
nRBC: 0 % (ref 0.0–0.2)

## 2021-02-09 LAB — HIV ANTIBODY (ROUTINE TESTING W REFLEX): HIV Screen 4th Generation wRfx: NONREACTIVE

## 2021-02-09 MED ORDER — POTASSIUM CHLORIDE CRYS ER 20 MEQ PO TBCR
40.0000 meq | EXTENDED_RELEASE_TABLET | ORAL | Status: AC
  Administered 2021-02-09 (×2): 40 meq via ORAL
  Filled 2021-02-09 (×2): qty 2

## 2021-02-09 MED ORDER — CHLORHEXIDINE GLUCONATE CLOTH 2 % EX PADS
6.0000 | MEDICATED_PAD | Freq: Every day | CUTANEOUS | Status: DC
  Administered 2021-02-09 – 2021-02-12 (×4): 6 via TOPICAL

## 2021-02-09 MED ORDER — SODIUM CHLORIDE 0.9 % IV BOLUS
1000.0000 mL | Freq: Once | INTRAVENOUS | Status: AC
Start: 2021-02-09 — End: 2021-02-09
  Administered 2021-02-09: 1000 mL via INTRAVENOUS

## 2021-02-09 MED ORDER — SODIUM CHLORIDE 0.9 % IV SOLN: INTRAVENOUS | Status: DC

## 2021-02-09 NOTE — Care Management Important Message (Signed)
Important Message  Patient Details  Name: [name] MRN: [id number] Date of Birth: [birth year]   Medicare Important Message Given:  Yes     Tommy Medal 02/09/2021, 12:21 PM

## 2021-02-09 NOTE — Progress Notes (Signed)
°   02/09/21 1500  Assess: MEWS Score  Temp (!) 101.2 F (38.4 C)  BP 123/65  Pulse Rate (!) 48  Resp 20  Level of Consciousness Alert  SpO2 98 %  O2 Device Room Air  Assess: MEWS Score  MEWS Temp 1  MEWS Systolic 0  MEWS Pulse 1  MEWS RR 0  MEWS LOC 0  MEWS Score 2  MEWS Score Color Yellow  Assess: if the MEWS score is Yellow or Red  Were vital signs taken at a resting state? Yes  Focused Assessment Change from prior assessment (see assessment flowsheet)  Early Detection of Sepsis Score *See Row Information* Medium  MEWS guidelines implemented *See Row Information* No, vital signs rechecked  Treat  MEWS Interventions Administered prn meds/treatments  Pain Scale 0-10  Pain Score 0  Take Vital Signs  Increase Vital Sign Frequency  Yellow: Q 2hr X 2 then Q 4hr X 2, if remains yellow, continue Q 4hrs  Escalate  MEWS: Escalate Yellow: discuss with charge nurse/RN and consider discussing with provider and RRT  Notify: Charge Nurse/RN  Name of Charge Nurse/RN Notified Glori Luis, RN  Date Charge Nurse/RN Notified 02/09/21  Time Charge Nurse/RN Notified 1546  Notify: Provider  Provider Renford Dills, MD  Date Provider Notified 02/09/21  Time Provider Notified 1546  Notification Type Page  Notification Reason Change in status  Provider response No new orders  Date of Provider Response 02/09/21  Time of Provider Response 1546  Document  Patient Outcome Stabilized after interventions

## 2021-02-09 NOTE — Progress Notes (Signed)
PROGRESS NOTE     [name], is a 66 y.o. female, DOB - 10-21-1955, FTD:[id number]  Admit date - 02/08/2021   Admitting Physician Bethena Roys, MD  Outpatient Primary MD for the patient is Aletha Halim., PA-C  LOS - 1  Chief Complaint  Patient presents with   Fever        Brief Narrative:  66 y.o. female with medical history significant for multiple sclerosis, small bowel obstruction, scoliosis-history of recurrent UTIs in the setting of self-catheterization due to neurogenic bladder secondary to multiple sclerosis admitted on 02/08/2021 with sepsis secondary to urinary source  Assessment & Plan:   Principal Problem:   Sepsis ([place]) Active Problems:   UTI (urinary tract infection)   Multiple sclerosis ([place])   Self-catheterizes urinary bladder   1)Sepsis secondary to urinary source--POA- -Patient with complicated UTI given recurrent self-catheterization -History of MDR E. coli about 6 months ago Sepsis pathophysiology persist -Patient with fevers, shaking chills and tachycardia -history of recurrent UTIs in the setting of self-catheterization due to neurogenic bladder secondary to multiple sclerosis admitted on 02/08/2021 with sepsis secondary to urinary source -Concern for MDR organism--continue meropenem pending urine and blood culture data -Continue IV fluids -Continue oxybutynin  2) multiple sclerosis with neurogenic bladder and ambulatory dysfunction--- supportive care--- PTA patient was self catheterizing, okay to keep Foley for now   Disposition/Need for in-Hospital Stay- patient unable to be discharged at this time due to --sepsis secondary to urinary source requiring IV fluids and IV antibiotics*  Status is: Inpatient  Remains inpatient appropriate because:   Disposition: The patient is from: Home              Anticipated d/c is to: Home              Anticipated d/c date is: 2 days              Patient currently is not medically stable to  d/c. Barriers: Not Clinically Stable-   Code Status :  -  Code Status: Full Code   Family Communication:    NA (patient is alert, awake and coherent)  Discussed with husband at bedside  Consults  :  na  DVT Prophylaxis  :   - SCDs   enoxaparin (LOVENOX) injection 40 mg Start: 02/08/21 2130    Lab Results  Component Value Date   PLT 217 02/09/2021    Inpatient Medications  Scheduled Meds:  Chlorhexidine Gluconate Cloth  6 each Topical Daily   enoxaparin (LOVENOX) injection  40 mg Subcutaneous Q24H   oxybutynin  5 mg Oral TID   Continuous Infusions:  meropenem (MERREM) IV 1 g (02/09/21 1329)   PRN Meds:.acetaminophen **OR** acetaminophen, ondansetron **OR** ondansetron (ZOFRAN) IV, polyethylene glycol   Anti-infectives (From admission, onward)    Start     Dose/Rate Route Frequency Ordered Stop   02/08/21 2200  meropenem (MERREM) 1 g in sodium chloride 0.9 % 100 mL IVPB        1 g 200 mL/hr over 30 Minutes Intravenous Every 8 hours 02/08/21 2041     02/08/21 1430  cefTRIAXone (ROCEPHIN) 2 g in sodium chloride 0.9 % 100 mL IVPB  Status:  Discontinued        2 g 200 mL/hr over 30 Minutes Intravenous Every 24 hours 02/08/21 1423 02/08/21 2035         Subjective: Izola Teague today has no fevers, no emesis,  No chest pain,  No fever  Or chills  No Nausea, Vomiting or Diarrhea  Objective: Vitals:   02/09/21 1425 02/09/21 1500 02/09/21 1700 02/09/21 1900  BP: (!) 129/58 123/65 (!) 128/56 131/68  Pulse: (!) 115 (!) 48 (!) 128 (!) 115  Resp: 20 20 20 20   Temp: 99.9 F (37.7 C) (!) 101.2 F (38.4 C) 99.3 F (37.4 C) 98.6 F (37 C)  TempSrc: Oral Oral Oral Oral  SpO2: 98% 98% 98% 95%  Weight:      Height:        Intake/Output Summary (Last 24 hours) at 02/09/2021 1945 Last data filed at 02/09/2021 0528 Gross per 24 hour  Intake 103.72 ml  Output 600 ml  Net -496.28 ml   Filed Weights   02/08/21 1946  Weight: 51.2 kg    Physical Exam  Gen:- Awake  Alert,  in no apparent distress  HEENT:- [place].AT, No sclera icterus Neck-Supple Neck,No JVD,.  Lungs-  CTAB , fair symmetrical air movement CV- S1, S2 normal, regular  Abd-  +ve B.Sounds, Abd Soft, No CVA area tenderness,    Extremity/Skin:- No  edema, pedal pulses present  Psych-affect is appropriate, oriented x3 Neuro-no new focal deficits, no tremors GU--Foley in situ  Data Reviewed: I have personally reviewed following labs and imaging studies  CBC: Recent Labs  Lab 02/08/21 1510 02/09/21 0456  WBC 18.7* 16.9*  NEUTROABS 16.5*  --   HGB 12.3 11.3*  HCT 37.0 33.9*  MCV 102.8* 103.4*  PLT 272 174   Basic Metabolic Panel: Recent Labs  Lab 02/08/21 1510 02/09/21 0456  NA 134* 138  K 3.6 3.1*  CL 99 105  CO2 25 26  GLUCOSE 116* 95  BUN 21 13  CREATININE 0.79 0.77  CALCIUM 9.1 8.7*   GFR: Estimated Creatinine Clearance: 54 mL/min (by C-G formula based on SCr of 0.77 mg/dL). Liver Function Tests: Recent Labs  Lab 02/08/21 1510  AST 24  ALT 19  ALKPHOS 54  BILITOT 0.4  PROT 6.9  ALBUMIN 4.0   No results for input(s): LIPASE, AMYLASE in the last 168 hours. No results for input(s): AMMONIA in the last 168 hours. Coagulation Profile: Recent Labs  Lab 02/08/21 1510  INR 1.1   Cardiac Enzymes: No results for input(s): CKTOTAL, CKMB, CKMBINDEX, TROPONINI in the last 168 hours. BNP (last 3 results) No results for input(s): PROBNP in the last 8760 hours. HbA1C: No results for input(s): HGBA1C in the last 72 hours. CBG: No results for input(s): GLUCAP in the last 168 hours. Lipid Profile: No results for input(s): CHOL, HDL, LDLCALC, TRIG, CHOLHDL, LDLDIRECT in the last 72 hours. Thyroid Function Tests: No results for input(s): TSH, T4TOTAL, FREET4, T3FREE, THYROIDAB in the last 72 hours. Anemia Panel: No results for input(s): VITAMINB12, FOLATE, FERRITIN, TIBC, IRON, RETICCTPCT in the last 72 hours. Urine analysis:    Component Value Date/Time    COLORURINE YELLOW 02/08/2021 1543   APPEARANCEUR CLOUDY (A) 02/08/2021 1543   LABSPEC 1.017 02/08/2021 1543   PHURINE 6.0 02/08/2021 1543   GLUCOSEU NEGATIVE 02/08/2021 1543   HGBUR LARGE (A) 02/08/2021 1543   BILIRUBINUR NEGATIVE 02/08/2021 1543   BILIRUBINUR negative 02/08/2021 1231   KETONESUR 5 (A) 02/08/2021 1543   KETONESUR trace (5) (A) 02/08/2021 1231   PROTEINUR 100 (A) 02/08/2021 1543   PROTEINUR >=300 (A) 02/08/2021 1231   UROBILINOGEN 0.2 02/08/2021 1231   UROBILINOGEN 0.2 08/04/2013 0201   NITRITE NEGATIVE 02/08/2021 1543   NITRITE Negative 02/08/2021 1231   LEUKOCYTESUR LARGE (A) 02/08/2021 1543  LEUKOCYTESUR Small (1+) (A) 02/08/2021 1231   Sepsis Labs: @LABRCNTIP (procalcitonin:4,lacticidven:4)  ) Recent Results (from the past 240 hour(s))  Blood Culture (routine x 2)     Status: None (Preliminary result)   Collection Time: 02/08/21  3:00 PM   Specimen: BLOOD RIGHT HAND  Result Value Ref Range Status   Specimen Description BLOOD RIGHT HAND  Final   Special Requests   Final    BOTTLES DRAWN AEROBIC AND ANAEROBIC Blood Culture adequate volume   Culture   Final    NO GROWTH < 12 HOURS Performed at [facility], [address] [place], [place] [address]    Report Status PENDING  Incomplete  Blood Culture (routine x 2)     Status: None (Preliminary result)   Collection Time: 02/08/21  3:10 PM   Specimen: BLOOD  Result Value Ref Range Status   Specimen Description BLOOD  Final   Special Requests BLOOD  Final   Culture   Final    NO GROWTH < 12 HOURS Performed at [facility], [address], [place], [place] [address]    Report Status PENDING  Incomplete  Resp Panel by RT-PCR (Flu A&B, Covid) Nasopharyngeal Swab     Status: None   Collection Time: 02/08/21  3:44 PM   Specimen: Nasopharyngeal Swab; Nasopharyngeal(NP) swabs in vial transport medium  Result Value Ref Range Status   SARS Coronavirus 2 by RT PCR NEGATIVE NEGATIVE Final    Comment:  (NOTE) SARS-CoV-2 target nucleic acids are NOT DETECTED.  The SARS-CoV-2 RNA is generally detectable in upper respiratory specimens during the acute phase of infection. The lowest concentration of SARS-CoV-2 viral copies this assay can detect is 138 copies/mL. A negative result does not preclude SARS-Cov-2 infection and should not be used as the sole basis for treatment or other patient management decisions. A negative result may occur with  improper specimen collection/handling, submission of specimen other than nasopharyngeal swab, presence of viral mutation(s) within the areas targeted by this assay, and inadequate number of viral copies(<138 copies/mL). A negative result must be combined with clinical observations, patient history, and epidemiological information. The expected result is Negative.  Fact Sheet for Patients:  EntrepreneurPulse.com.au  Fact Sheet for Healthcare Providers:  IncredibleEmployment.be  This test is no t yet approved or cleared by the [place] FDA and  has been authorized for detection and/or diagnosis of SARS-CoV-2 by FDA under an Emergency Use Authorization (EUA). This EUA will remain  in effect (meaning this test can be used) for the duration of the COVID-19 declaration under Section 564(b)(1) of the Act, 21 U.S.C.section 360bbb-3(b)(1), unless the authorization is terminated  or revoked sooner.       Influenza A by PCR NEGATIVE NEGATIVE Final   Influenza B by PCR NEGATIVE NEGATIVE Final    Comment: (NOTE) The Xpert Xpress SARS-CoV-2/FLU/RSV plus assay is intended as an aid in the diagnosis of influenza from Nasopharyngeal swab specimens and should not be used as a sole basis for treatment. Nasal washings and aspirates are unacceptable for Xpert Xpress SARS-CoV-2/FLU/RSV testing.  Fact Sheet for Patients: EntrepreneurPulse.com.au  Fact Sheet for Healthcare  Providers: IncredibleEmployment.be  This test is not yet approved or cleared by the [place] FDA and has been authorized for detection and/or diagnosis of SARS-CoV-2 by FDA under an Emergency Use Authorization (EUA). This EUA will remain in effect (meaning this test can be used) for the duration of the COVID-19 declaration under Section 564(b)(1) of the Act, 21 U.S.C. section 360bbb-3(b)(1), unless  the authorization is terminated or revoked.  Performed at [facility], [address] [place], [place] [address]       Radiology Studies: [facility] Chest [facility] 1 View  Result Date: 02/08/2021 CLINICAL DATA:  Fever.  Tachycardia.  UTI.  Question sepsis. EXAM: PORTABLE CHEST 1 VIEW COMPARISON:  09/07/2010 FINDINGS: Artifact overlies the chest. Heart size is normal. There is aortic atherosclerosis. The lungs are clear. The vascularity is normal. No effusions. There is curvature of the spine which could be positional. IMPRESSION: No active disease. Electronically Signed   By: Nelson Chimes M.D.   On: 02/08/2021 14:44     Scheduled Meds:  Chlorhexidine Gluconate Cloth  6 each Topical Daily   enoxaparin (LOVENOX) injection  40 mg Subcutaneous Q24H   oxybutynin  5 mg Oral TID   Continuous Infusions:  meropenem (MERREM) IV 1 g (02/09/21 1329)     LOS: 1 day   Roxan Hockey M.D on 02/09/2021 at 7:45 PM  Go to [email or web address] - for contact info  [facility] - Office  [phone]  If 7PM-7AM, please contact night-coverage [email or web address] Password [facility] 02/09/2021, 7:45 PM

## 2021-02-09 NOTE — TOC Progression Note (Signed)
°  Transition of Care [facility]) Screening Note   Patient Details  Name: [name] Date of Birth: [birth year]   Transition of Care [facility]) CM/SW Contact:    Shade Flood, LCSW Phone Number: 02/09/2021, 11:39 AM    Transition of Care Department [facility]) has reviewed patient and no TOC needs have been identified at this time. We will continue to monitor patient advancement through interdisciplinary progression rounds. If new patient transition needs arise, please place a TOC consult.

## 2021-02-10 DIAGNOSIS — R509 Fever, unspecified: Secondary | ICD-10-CM

## 2021-02-10 DIAGNOSIS — R000 Tachycardia, unspecified: Secondary | ICD-10-CM

## 2021-02-10 LAB — COMPREHENSIVE METABOLIC PANEL
ALT: 19 U/L (ref 0–44)
AST: 26 U/L (ref 15–41)
Albumin: 2.8 g/dL — ABNORMAL LOW (ref 3.5–5.0)
Alkaline Phosphatase: 58 U/L (ref 38–126)
Anion gap: 9 (ref 5–15)
BUN: 10 mg/dL (ref 8–23)
CO2: 21 mmol/L — ABNORMAL LOW (ref 22–32)
Calcium: 8.1 mg/dL — ABNORMAL LOW (ref 8.9–10.3)
Chloride: 105 mmol/L (ref 98–111)
Creatinine, Ser: 0.68 mg/dL (ref 0.44–1.00)
GFR, Estimated: >60 mL/min (ref >60)
Glucose, Bld: 100 mg/dL — ABNORMAL HIGH (ref 70–99)
Potassium: 3.6 mmol/L (ref 3.5–5.1)
Sodium: 135 mmol/L (ref 135–145)
Total Bilirubin: 0.6 mg/dL (ref 0.3–1.2)
Total Protein: 5.7 g/dL — ABNORMAL LOW (ref 6.5–8.1)

## 2021-02-10 LAB — CBC
HCT: 34.4 % — ABNORMAL LOW (ref 36.0–46.0)
Hemoglobin: 11.4 g/dL — ABNORMAL LOW (ref 12.0–15.0)
MCH: 33.9 pg (ref 26.0–34.0)
MCHC: 33.1 g/dL (ref 30.0–36.0)
MCV: 102.4 fL — ABNORMAL HIGH (ref 80.0–100.0)
Platelets: 176 10*3/uL (ref 150–400)
RBC: 3.36 MIL/uL — ABNORMAL LOW (ref 3.87–5.11)
RDW: 11.8 % (ref 11.5–15.5)
WBC: 9.4 10*3/uL (ref 4.0–10.5)
nRBC: 0 % (ref 0.0–0.2)

## 2021-02-10 NOTE — Progress Notes (Addendum)
PROGRESS NOTE     [name], is a 66 y.o. female, DOB - [birth year], HLK:[id number]  Admit date - 02/08/2021   Admitting Physician Bethena Roys, MD  Outpatient Primary MD for the patient is Aletha Halim., PA-C  LOS - 2  Chief Complaint  Patient presents with   Fever        Brief Narrative:  66 y.o. female with medical history significant for multiple sclerosis, small bowel obstruction, scoliosis-history of recurrent UTIs in the setting of self-catheterization due to neurogenic bladder secondary to multiple sclerosis admitted on 02/08/2021 with sepsis secondary to urinary source  Assessment & Plan:   Principal Problem:   Sepsis ([place]) Active Problems:   UTI (urinary tract infection)   Multiple sclerosis ([place])   Self-catheterizes urinary bladder   1) Enterobacter sepsis secondary to urinary source--POA- -Patient with complicated UTI given recurrent self-catheterization -History of MDR E. coli about 6 months ago -history of recurrent UTIs in the setting of self-catheterization due to neurogenic bladder secondary to multiple sclerosis admitted on 02/08/2021 with sepsis secondary to urinary source -Concern for MDR organism-- -Continue oxybutynin -Sepsis pathophysiology persist, continue IV fluids and IV meropenem pending further culture data  2) multiple sclerosis with neurogenic bladder and ambulatory dysfunction--- supportive care--- PTA patient was self catheterizing, okay to keep Foley for now  Disposition/Need for in-Hospital Stay- patient unable to be discharged at this time due to --Enterobacter sepsis secondary to urinary source requiring IV fluids and IV antibiotics*  Status is: Inpatient  Remains inpatient appropriate because:   Disposition: The patient is from: Home              Anticipated d/c is to: Home              Anticipated d/c date is: 2 days              Patient currently is not medically stable to d/c. Barriers: Not Clinically Stable-    Code Status :  -  Code Status: Full Code   Family Communication:    NA (patient is alert, awake and coherent)  Discussed with husband at bedside  Consults  :  na  DVT Prophylaxis  :   - SCDs   enoxaparin (LOVENOX) injection 40 mg Start: 02/08/21 2130    Lab Results  Component Value Date   PLT 176 02/10/2021    Inpatient Medications  Scheduled Meds:  Chlorhexidine Gluconate Cloth  6 each Topical Daily   enoxaparin (LOVENOX) injection  40 mg Subcutaneous Q24H   oxybutynin  5 mg Oral TID   Continuous Infusions:  sodium chloride 150 mL/hr at 02/10/21 1159   meropenem (MERREM) IV 1 g (02/10/21 1418)   PRN Meds:.acetaminophen **OR** acetaminophen, ondansetron **OR** ondansetron (ZOFRAN) IV, polyethylene glycol   Anti-infectives (From admission, onward)    Start     Dose/Rate Route Frequency Ordered Stop   02/08/21 2200  meropenem (MERREM) 1 g in sodium chloride 0.9 % 100 mL IVPB        1 g 200 mL/hr over 30 Minutes Intravenous Every 8 hours 02/08/21 2041     02/08/21 1430  cefTRIAXone (ROCEPHIN) 2 g in sodium chloride 0.9 % 100 mL IVPB  Status:  Discontinued        2 g 200 mL/hr over 30 Minutes Intravenous Every 24 hours 02/08/21 1423 02/08/21 2035       Subjective: [name] today has , no emesis,  No chest pain,  - Husband at bedside, had  fevers overnight, -No further shaking chills today -No BM today   Objective: Vitals:   02/10/21 0655 02/10/21 0740 02/10/21 0840 02/10/21 1247  BP: (!) 145/65 (!) 113/92 132/71 137/69  Pulse: (!) 130 (!) 116 (!) 115 (!) 115  Resp: 16 18 18 18   Temp: 99.3 F (37.4 C) 98.4 F (36.9 C) 98.3 F (36.8 C) 98.9 F (37.2 C)  TempSrc: Oral Oral Oral Oral  SpO2: 93% 97% 96% 95%  Weight:      Height:        Intake/Output Summary (Last 24 hours) at 02/10/2021 1527 Last data filed at 02/10/2021 1335 Gross per 24 hour  Intake 2627.66 ml  Output 3550 ml  Net -922.34 ml   Filed Weights   02/08/21 1946  Weight: 51.2 kg     Physical Exam  Gen:- Awake Alert,  in no apparent distress  HEENT:- [place].AT, No sclera icterus Neck-Supple Neck,No JVD,.  Lungs-  CTAB , fair symmetrical air movement CV- S1, S2 normal, regular  Abd-  +ve B.Sounds, Abd Soft, No CVA area tenderness,    Extremity/Skin:- No  edema, pedal pulses present  Psych-affect is appropriate, oriented x3 Neuro-no new focal deficits, no tremors GU--Foley in situ  Data Reviewed: I have personally reviewed following labs and imaging studies  CBC: Recent Labs  Lab 02/08/21 1510 02/09/21 0456 02/10/21 0446  WBC 18.7* 16.9* 9.4  NEUTROABS 16.5*  --   --   HGB 12.3 11.3* 11.4*  HCT 37.0 33.9* 34.4*  MCV 102.8* 103.4* 102.4*  PLT 272 217 854   Basic Metabolic Panel: Recent Labs  Lab 02/08/21 1510 02/09/21 0456 02/10/21 0446  NA 134* 138 135  K 3.6 3.1* 3.6  CL 99 105 105  CO2 25 26 21*  GLUCOSE 116* 95 100*  BUN 21 13 10   CREATININE 0.79 0.77 0.68  CALCIUM 9.1 8.7* 8.1*   GFR: Estimated Creatinine Clearance: 54 mL/min (by C-G formula based on SCr of 0.68 mg/dL). Liver Function Tests: Recent Labs  Lab 02/08/21 1510 02/10/21 0446  AST 24 26  ALT 19 19  ALKPHOS 54 58  BILITOT 0.4 0.6  PROT 6.9 5.7*  ALBUMIN 4.0 2.8*   No results for input(s): LIPASE, AMYLASE in the last 168 hours. No results for input(s): AMMONIA in the last 168 hours. Coagulation Profile: Recent Labs  Lab 02/08/21 1510  INR 1.1   Cardiac Enzymes: No results for input(s): CKTOTAL, CKMB, CKMBINDEX, TROPONINI in the last 168 hours. BNP (last 3 results) No results for input(s): PROBNP in the last 8760 hours. HbA1C: No results for input(s): HGBA1C in the last 72 hours. CBG: No results for input(s): GLUCAP in the last 168 hours. Lipid Profile: No results for input(s): CHOL, HDL, LDLCALC, TRIG, CHOLHDL, LDLDIRECT in the last 72 hours. Thyroid Function Tests: No results for input(s): TSH, T4TOTAL, FREET4, T3FREE, THYROIDAB in the last 72 hours. Anemia  Panel: No results for input(s): VITAMINB12, FOLATE, FERRITIN, TIBC, IRON, RETICCTPCT in the last 72 hours. Urine analysis:    Component Value Date/Time   COLORURINE YELLOW 02/08/2021 1543   APPEARANCEUR CLOUDY (A) 02/08/2021 1543   LABSPEC 1.017 02/08/2021 1543   PHURINE 6.0 02/08/2021 1543   GLUCOSEU NEGATIVE 02/08/2021 1543   HGBUR LARGE (A) 02/08/2021 1543   BILIRUBINUR NEGATIVE 02/08/2021 1543   BILIRUBINUR negative 02/08/2021 1231   KETONESUR 5 (A) 02/08/2021 1543   KETONESUR trace (5) (A) 02/08/2021 1231   PROTEINUR 100 (A) 02/08/2021 1543   PROTEINUR >=300 (A) 02/08/2021 1231  UROBILINOGEN 0.2 02/08/2021 1231   UROBILINOGEN 0.2 08/04/2013 0201   NITRITE NEGATIVE 02/08/2021 1543   NITRITE Negative 02/08/2021 1231   LEUKOCYTESUR LARGE (A) 02/08/2021 1543   LEUKOCYTESUR Small (1+) (A) 02/08/2021 1231   Sepsis Labs: @LABRCNTIP (procalcitonin:4,lacticidven:4)  ) Recent Results (from the past 240 hour(s))  Blood Culture (routine x 2)     Status: None (Preliminary result)   Collection Time: 02/08/21  3:00 PM   Specimen: BLOOD RIGHT HAND  Result Value Ref Range Status   Specimen Description BLOOD RIGHT HAND  Final   Special Requests   Final    BOTTLES DRAWN AEROBIC AND ANAEROBIC Blood Culture adequate volume   Culture   Final    NO GROWTH 2 DAYS Performed at [facility], [address] [place], [place] [address]    Report Status PENDING  Incomplete  Blood Culture (routine x 2)     Status: None (Preliminary result)   Collection Time: 02/08/21  3:10 PM   Specimen: BLOOD  Result Value Ref Range Status   Specimen Description BLOOD  Final   Special Requests BLOOD  Final   Culture   Final    NO GROWTH 2 DAYS Performed at [facility], [address], [place], [place] [address]    Report Status PENDING  Incomplete  Resp Panel by RT-PCR (Flu A&B, Covid) Nasopharyngeal Swab     Status: None   Collection Time: 02/08/21  3:44 PM   Specimen: Nasopharyngeal Swab;  Nasopharyngeal(NP) swabs in vial transport medium  Result Value Ref Range Status   SARS Coronavirus 2 by RT PCR NEGATIVE NEGATIVE Final    Comment: (NOTE) SARS-CoV-2 target nucleic acids are NOT DETECTED.  The SARS-CoV-2 RNA is generally detectable in upper respiratory specimens during the acute phase of infection. The lowest concentration of SARS-CoV-2 viral copies this assay can detect is 138 copies/mL. A negative result does not preclude SARS-Cov-2 infection and should not be used as the sole basis for treatment or other patient management decisions. A negative result may occur with  improper specimen collection/handling, submission of specimen other than nasopharyngeal swab, presence of viral mutation(s) within the areas targeted by this assay, and inadequate number of viral copies(<138 copies/mL). A negative result must be combined with clinical observations, patient history, and epidemiological information. The expected result is Negative.  Fact Sheet for Patients:  EntrepreneurPulse.com.au  Fact Sheet for Healthcare Providers:  IncredibleEmployment.be  This test is no t yet approved or cleared by the [place] FDA and  has been authorized for detection and/or diagnosis of SARS-CoV-2 by FDA under an Emergency Use Authorization (EUA). This EUA will remain  in effect (meaning this test can be used) for the duration of the COVID-19 declaration under Section 564(b)(1) of the Act, 21 U.S.C.section 360bbb-3(b)(1), unless the authorization is terminated  or revoked sooner.       Influenza A by PCR NEGATIVE NEGATIVE Final   Influenza B by PCR NEGATIVE NEGATIVE Final    Comment: (NOTE) The Xpert Xpress SARS-CoV-2/FLU/RSV plus assay is intended as an aid in the diagnosis of influenza from Nasopharyngeal swab specimens and should not be used as a sole basis for treatment. Nasal washings and aspirates are unacceptable for Xpert Xpress  SARS-CoV-2/FLU/RSV testing.  Fact Sheet for Patients: EntrepreneurPulse.com.au  Fact Sheet for Healthcare Providers: IncredibleEmployment.be  This test is not yet approved or cleared by the [place] FDA and has been authorized for detection and/or diagnosis of SARS-CoV-2 by FDA under an Emergency Use Authorization (EUA). This  EUA will remain in effect (meaning this test can be used) for the duration of the COVID-19 declaration under Section 564(b)(1) of the Act, 21 U.S.C. section 360bbb-3(b)(1), unless the authorization is terminated or revoked.  Performed at [facility], [address] [place], [place] [address]   Urine Culture     Status: Abnormal (Preliminary result)   Collection Time: 02/08/21  3:44 PM   Specimen: Urine, Catheterized  Result Value Ref Range Status   Specimen Description   Final    URINE, CATHETERIZED Performed at [facility], [address] [place], [place] [address]    Special Requests   Final    NONE Performed at [facility], [address], [place], [place] [address]    Culture (A)  Final    20,000 COLONIES/mL ENTEROBACTER SPECIES SUSCEPTIBILITIES TO FOLLOW Performed at [facility], [place] [address], [place], [place] [address]    Report Status PENDING  Incomplete  Culture, blood (Routine X 2) w Reflex to ID Panel     Status: None (Preliminary result)   Collection Time: 02/10/21  4:57 AM   Specimen: Left Antecubital; Blood  Result Value Ref Range Status   Specimen Description   Final    LEFT ANTECUBITAL BOTTLES DRAWN AEROBIC AND ANAEROBIC   Special Requests   Final    Blood Culture results may not be optimal due to an excessive volume of blood received in culture bottles Performed at [facility], [address], [place], [place] [address]    Culture PENDING  Incomplete   Report Status PENDING  Incomplete  Culture, blood (Routine X 2) w Reflex to ID Panel     Status: None (Preliminary result)    Collection Time: 02/10/21  5:05 AM   Specimen: BLOOD LEFT HAND  Result Value Ref Range Status   Specimen Description   Final    BLOOD LEFT HAND BOTTLES DRAWN AEROBIC AND ANAEROBIC   Special Requests   Final    Blood Culture adequate volume Performed at [facility], [address] [place], [place] [address]    Culture PENDING  Incomplete   Report Status PENDING  Incomplete    Radiology Studies: No results found.   Scheduled Meds:  Chlorhexidine Gluconate Cloth  6 each Topical Daily   enoxaparin (LOVENOX) injection  40 mg Subcutaneous Q24H   oxybutynin  5 mg Oral TID   Continuous Infusions:  sodium chloride 150 mL/hr at 02/10/21 1159   meropenem (MERREM) IV 1 g (02/10/21 1418)    LOS: 2 days   Roxan Hockey M.D on 02/10/2021 at 3:27 PM  Go to [email or web address] - for contact info  [facility] - Office  [phone]  If 7PM-7AM, please contact night-coverage [email or web address] Password [facility] 02/10/2021, 3:27 PM

## 2021-02-10 NOTE — Progress Notes (Signed)
°   02/10/21 0439  Assess: MEWS Score  Temp (!) 103 F (39.4 C)  BP (!) 147/81  Pulse Rate (!) 133  Resp 16  SpO2 91 %  O2 Device Room Air  Assess: MEWS Score  MEWS Temp 2  MEWS Systolic 0  MEWS Pulse 3  MEWS RR 0  MEWS LOC 0  MEWS Score 5  MEWS Score Color Red  Assess: if the MEWS score is Yellow or Red  Were vital signs taken at a resting state? Yes  Focused Assessment No change from prior assessment  Early Detection of Sepsis Score *See Row Information* Medium  MEWS guidelines implemented *See Row Information* Yes  Treat  MEWS Interventions Administered scheduled meds/treatments  Pain Scale 0-10  Pain Score 0  Take Vital Signs  Increase Vital Sign Frequency  Red: Q 1hr X 4 then Q 4hr X 4, if remains red, continue Q 4hrs  Escalate  MEWS: Escalate Red: discuss with charge nurse/RN and provider, consider discussing with RRT  Notify: Charge Nurse/RN  Name of Charge Nurse/RN Notified Donney Rankins, RN  Date Charge Nurse/RN Notified 02/10/21  Time Charge Nurse/RN Notified 0440  Notify: Provider  Provider Name/Title O. Josephine Cables, MD  Date Provider Notified 02/10/21  Time Provider Notified [phone]  Notification Type Page  Notification Reason Change in status  Provider response See new orders  Date of Provider Response 02/10/21  Time of Provider Response 0440  Document  Patient Outcome Stabilized after interventions   EKG done and Tylenol given per MD order. We continue to monitor

## 2021-02-10 NOTE — Progress Notes (Signed)
RN called due to patient having a sustained sinus tachycardia at 140 bpm.  EKG done confirmed sinus tachycardia at rate of 140 bpm.   BP (!) 147/81 (BP Location: Right Arm)    Pulse (!) 133    Temp (!) 103 F (39.4 C) (Oral)    Resp 16    Ht 5\' 2"  (1.575 m)    Wt 51.2 kg    SpO2 91%    BMI 20.65 kg/m   She was noted to be febrile, RN was asked to give Tylenol already ordered.  Blood culture was ordered.  We shall continue to monitor patient and treat accordingly. Please refer to patient's progress notes for details regarding the care of this patient  Total time:  8 minutes This includes time reviewing the chart including progress notes, labs, EKGs, taking medical decisions, ordering labs and documenting findings.

## 2021-02-11 LAB — URINE CULTURE: Culture: 20000 — AB

## 2021-02-11 NOTE — Progress Notes (Signed)
PROGRESS NOTE     [name], is a 66 y.o. female, DOB - 1955-02-02, WVP:[id number]  Admit date - 02/08/2021   Admitting Physician Bethena Roys, MD  Outpatient Primary MD for the patient is Aletha Halim., PA-C  LOS - 3  Chief Complaint  Patient presents with   Fever        Brief Narrative:  66 y.o. female with medical history significant for multiple sclerosis, small bowel obstruction, scoliosis-history of recurrent UTIs in the setting of self-catheterization due to neurogenic bladder secondary to multiple sclerosis admitted on 02/08/2021 with sepsis secondary to urinary source  Assessment & Plan:   Principal Problem:   Sepsis ([place]) Active Problems:   UTI (urinary tract infection)   Multiple sclerosis ([name])   Self-catheterizes urinary bladder   1)MDR Enterobacter sepsis secondary to urinary source--POA- -Patient with complicated UTI given recurrent self-catheterization -History of MDR E. coli about 6 months ago -history of recurrent UTIs in the setting of self-catheterization due to neurogenic bladder secondary to multiple sclerosis admitted on 02/08/2021 with sepsis secondary to urinary source 02/11/2021 - had fevers and chills overnight again, TMax 102 -Continue oxybutynin -Sepsis pathophysiology persist, continue IV fluids and IV meropenem pending further culture data -Anticipate switching to oral Cipro in am if no further Fevers (will dc home if afebrile for > 24 hrs)  2)Multiple Sclerosis with Neurogenic bladder and ambulatory dysfunction--- supportive care--- PTA patient was self catheterizing, okay to keep Foley for now  Disposition/Need for in-Hospital Stay- patient unable to be discharged at this time due to --Enterobacter Sepsis secondary to urinary source requiring IV fluids and IV antibiotics -Anticipate switching to oral Cipro in am if no further Fevers (will dc home if afebrile for > 24 hrs)  Status is: Inpatient  Remains inpatient  appropriate because:   Disposition: The patient is from: Home              Anticipated d/c is to: Home              Anticipated d/c date is: 2 days              Patient currently is not medically stable to d/c. Barriers: Not Clinically Stable-   Code Status :  -  Code Status: Full Code   Family Communication:    (patient is alert, awake and coherent)  Discussed with husband at bedside  Consults  :  na  DVT Prophylaxis  :   - SCDs   enoxaparin (LOVENOX) injection 40 mg Start: 02/08/21 2130   Lab Results  Component Value Date   PLT 176 02/10/2021   Inpatient Medications  Scheduled Meds:  Chlorhexidine Gluconate Cloth  6 each Topical Daily   enoxaparin (LOVENOX) injection  40 mg Subcutaneous Q24H   oxybutynin  5 mg Oral TID   Continuous Infusions:  sodium chloride 150 mL/hr at 02/11/21 1711   meropenem (MERREM) IV 1 g (02/11/21 1442)   PRN Meds:.acetaminophen **OR** acetaminophen, ondansetron **OR** ondansetron (ZOFRAN) IV, polyethylene glycol   Anti-infectives (From admission, onward)    Start     Dose/Rate Route Frequency Ordered Stop   02/08/21 2200  meropenem (MERREM) 1 g in sodium chloride 0.9 % 100 mL IVPB        1 g 200 mL/hr over 30 Minutes Intravenous Every 8 hours 02/08/21 2041     02/08/21 1430  cefTRIAXone (ROCEPHIN) 2 g in sodium chloride 0.9 % 100 mL IVPB  Status:  Discontinued  2 g 200 mL/hr over 30 Minutes Intravenous Every 24 hours 02/08/21 1423 02/08/21 2035       Subjective: [name] today has , no emesis,  No chest pain,  - Husband at bedside,   had fevers and chills overnight again TMax 102 -Had BM today   Objective: Vitals:   02/10/21 2013 02/11/21 0047 02/11/21 0444 02/11/21 1558  BP: 127/70 (!) 149/88 (!) 141/67 (!) 111/55  Pulse: (!) 111 (!) 104 (!) 108 93  Resp: 19 19 16 19   Temp: 99.8 F (37.7 C) 97.9 F (36.6 C) 100 F (37.8 C) 98.7 F (37.1 C)  TempSrc: Oral Oral Oral Oral  SpO2: 95% 97% 95% 95%  Weight:       Height:        Intake/Output Summary (Last 24 hours) at 02/11/2021 1750 Last data filed at 02/11/2021 1330 Gross per 24 hour  Intake 2423.33 ml  Output 2850 ml  Net -426.67 ml   Filed Weights   02/08/21 1946  Weight: 51.2 kg   Physical Exam  Gen:- Awake Alert,  in no apparent distress  HEENT:- [place].AT, No sclera icterus Neck-Supple Neck,No JVD,.  Lungs-  CTAB , fair symmetrical air movement CV- S1, S2 normal, regular  Abd-  +ve B.Sounds, Abd Soft, No CVA area tenderness,    Extremity/Skin:- No  edema, pedal pulses present  Psych-affect is appropriate, oriented x3 Neuro-no new focal deficits, no tremors GU--Foley in situ  Data Reviewed: I have personally reviewed following labs and imaging studies  CBC: Recent Labs  Lab 02/08/21 1510 02/09/21 0456 02/10/21 0446  WBC 18.7* 16.9* 9.4  NEUTROABS 16.5*  --   --   HGB 12.3 11.3* 11.4*  HCT 37.0 33.9* 34.4*  MCV 102.8* 103.4* 102.4*  PLT 272 217 694   Basic Metabolic Panel: Recent Labs  Lab 02/08/21 1510 02/09/21 0456 02/10/21 0446  NA 134* 138 135  K 3.6 3.1* 3.6  CL 99 105 105  CO2 25 26 21*  GLUCOSE 116* 95 100*  BUN 21 13 10   CREATININE 0.79 0.77 0.68  CALCIUM 9.1 8.7* 8.1*   GFR: Estimated Creatinine Clearance: 54 mL/min (by C-G formula based on SCr of 0.68 mg/dL). Liver Function Tests: Recent Labs  Lab 02/08/21 1510 02/10/21 0446  AST 24 26  ALT 19 19  ALKPHOS 54 58  BILITOT 0.4 0.6  PROT 6.9 5.7*  ALBUMIN 4.0 2.8*   No results for input(s): LIPASE, AMYLASE in the last 168 hours. No results for input(s): AMMONIA in the last 168 hours. Coagulation Profile: Recent Labs  Lab 02/08/21 1510  INR 1.1   Cardiac Enzymes: No results for input(s): CKTOTAL, CKMB, CKMBINDEX, TROPONINI in the last 168 hours. BNP (last 3 results) No results for input(s): PROBNP in the last 8760 hours. HbA1C: No results for input(s): HGBA1C in the last 72 hours. CBG: No results for input(s): GLUCAP in the last 168  hours. Lipid Profile: No results for input(s): CHOL, HDL, LDLCALC, TRIG, CHOLHDL, LDLDIRECT in the last 72 hours. Thyroid Function Tests: No results for input(s): TSH, T4TOTAL, FREET4, T3FREE, THYROIDAB in the last 72 hours. Anemia Panel: No results for input(s): VITAMINB12, FOLATE, FERRITIN, TIBC, IRON, RETICCTPCT in the last 72 hours. Urine analysis:    Component Value Date/Time   COLORURINE YELLOW 02/08/2021 1543   APPEARANCEUR CLOUDY (A) 02/08/2021 1543   LABSPEC 1.017 02/08/2021 1543   PHURINE 6.0 02/08/2021 1543   GLUCOSEU NEGATIVE 02/08/2021 1543   HGBUR LARGE (A) 02/08/2021 1543  BILIRUBINUR NEGATIVE 02/08/2021 1543   BILIRUBINUR negative 02/08/2021 1231   KETONESUR 5 (A) 02/08/2021 1543   KETONESUR trace (5) (A) 02/08/2021 1231   PROTEINUR 100 (A) 02/08/2021 1543   PROTEINUR >=300 (A) 02/08/2021 1231   UROBILINOGEN 0.2 02/08/2021 1231   UROBILINOGEN 0.2 08/04/2013 0201   NITRITE NEGATIVE 02/08/2021 1543   NITRITE Negative 02/08/2021 1231   LEUKOCYTESUR LARGE (A) 02/08/2021 1543   LEUKOCYTESUR Small (1+) (A) 02/08/2021 1231   Sepsis Labs: @LABRCNTIP (procalcitonin:4,lacticidven:4)  ) Recent Results (from the past 240 hour(s))  Blood Culture (routine x 2)     Status: None (Preliminary result)   Collection Time: 02/08/21  3:00 PM   Specimen: BLOOD RIGHT HAND  Result Value Ref Range Status   Specimen Description BLOOD RIGHT HAND  Final   Special Requests   Final    BOTTLES DRAWN AEROBIC AND ANAEROBIC Blood Culture adequate volume   Culture   Final    NO GROWTH 3 DAYS Performed at [facility], [address] [place], [place] [address]    Report Status PENDING  Incomplete  Blood Culture (routine x 2)     Status: None (Preliminary result)   Collection Time: 02/08/21  3:10 PM   Specimen: BLOOD  Result Value Ref Range Status   Specimen Description BLOOD  Final   Special Requests BLOOD  Final   Culture   Final    NO GROWTH 3 DAYS Performed at [facility],  [address] [place], [place] [address]    Report Status PENDING  Incomplete  Resp Panel by RT-PCR (Flu A&B, Covid) Nasopharyngeal Swab     Status: None   Collection Time: 02/08/21  3:44 PM   Specimen: Nasopharyngeal Swab; Nasopharyngeal(NP) swabs in vial transport medium  Result Value Ref Range Status   SARS Coronavirus 2 by RT PCR NEGATIVE NEGATIVE Final    Comment: (NOTE) SARS-CoV-2 target nucleic acids are NOT DETECTED.  The SARS-CoV-2 RNA is generally detectable in upper respiratory specimens during the acute phase of infection. The lowest concentration of SARS-CoV-2 viral copies this assay can detect is 138 copies/mL. A negative result does not preclude SARS-Cov-2 infection and should not be used as the sole basis for treatment or other patient management decisions. A negative result may occur with  improper specimen collection/handling, submission of specimen other than nasopharyngeal swab, presence of viral mutation(s) within the areas targeted by this assay, and inadequate number of viral copies(<138 copies/mL). A negative result must be combined with clinical observations, patient history, and epidemiological information. The expected result is Negative.  Fact Sheet for Patients:  EntrepreneurPulse.com.au  Fact Sheet for Healthcare Providers:  IncredibleEmployment.be  This test is no t yet approved or cleared by the [place] FDA and  has been authorized for detection and/or diagnosis of SARS-CoV-2 by FDA under an Emergency Use Authorization (EUA). This EUA will remain  in effect (meaning this test can be used) for the duration of the COVID-19 declaration under Section 564(b)(1) of the Act, 21 U.S.C.section 360bbb-3(b)(1), unless the authorization is terminated  or revoked sooner.       Influenza A by PCR NEGATIVE NEGATIVE Final   Influenza B by PCR NEGATIVE NEGATIVE Final    Comment: (NOTE) The Xpert Xpress SARS-CoV-2/FLU/RSV  plus assay is intended as an aid in the diagnosis of influenza from Nasopharyngeal swab specimens and should not be used as a sole basis for treatment. Nasal washings and aspirates are unacceptable for Xpert Xpress SARS-CoV-2/FLU/RSV testing.  Fact Sheet for Patients:  EntrepreneurPulse.com.au  Fact Sheet for Healthcare Providers: IncredibleEmployment.be  This test is not yet approved or cleared by the [place] FDA and has been authorized for detection and/or diagnosis of SARS-CoV-2 by FDA under an Emergency Use Authorization (EUA). This EUA will remain in effect (meaning this test can be used) for the duration of the COVID-19 declaration under Section 564(b)(1) of the Act, 21 U.S.C. section 360bbb-3(b)(1), unless the authorization is terminated or revoked.  Performed at [facility], [address], [place], [place] [address]   Urine Culture     Status: Abnormal   Collection Time: 02/08/21  3:44 PM   Specimen: Urine, Catheterized  Result Value Ref Range Status   Specimen Description   Final    URINE, CATHETERIZED Performed at [facility], [address] [place], [place] [address]    Special Requests   Final    NONE Performed at [facility], [address], [place], [place] [address]    Culture (A)  Final    20,000 COLONIES/mL ENTEROBACTER CLOACAE Two isolates with different morphologies were identified as the same organism.The most resistant organism was reported. Performed at [facility], [place] [address], [place], [place] [address]    Report Status 02/11/2021 FINAL  Final   Organism ID, Bacteria ENTEROBACTER CLOACAE (A)  Final      Susceptibility   Enterobacter cloacae - MIC*    CEFAZOLIN >=64 RESISTANT Resistant     CEFEPIME <=0.12 SENSITIVE Sensitive     CIPROFLOXACIN <=0.25 SENSITIVE Sensitive     GENTAMICIN <=1 SENSITIVE Sensitive     IMIPENEM <=0.25 SENSITIVE Sensitive     NITROFURANTOIN 64 INTERMEDIATE Intermediate      TRIMETH/SULFA <=20 SENSITIVE Sensitive     PIP/TAZO 64 INTERMEDIATE Intermediate     * 20,000 COLONIES/mL ENTEROBACTER CLOACAE  Culture, blood (Routine X 2) w Reflex to ID Panel     Status: None (Preliminary result)   Collection Time: 02/10/21  4:57 AM   Specimen: Left Antecubital; Blood  Result Value Ref Range Status   Specimen Description   Final    LEFT ANTECUBITAL BOTTLES DRAWN AEROBIC AND ANAEROBIC   Special Requests   Final    Blood Culture results may not be optimal due to an excessive volume of blood received in culture bottles   Culture   Final    NO GROWTH 1 DAY Performed at [facility], [address] [place], [place] [address]    Report Status PENDING  Incomplete  Culture, blood (Routine X 2) w Reflex to ID Panel     Status: None (Preliminary result)   Collection Time: 02/10/21  5:05 AM   Specimen: BLOOD LEFT HAND  Result Value Ref Range Status   Specimen Description   Final    BLOOD LEFT HAND BOTTLES DRAWN AEROBIC AND ANAEROBIC   Special Requests Blood Culture adequate volume  Final   Culture   Final    NO GROWTH 1 DAY Performed at [facility], [address] [place], [place] [address]    Report Status PENDING  Incomplete    Radiology Studies: No results found.  Scheduled Meds:  Chlorhexidine Gluconate Cloth  6 each Topical Daily   enoxaparin (LOVENOX) injection  40 mg Subcutaneous Q24H   oxybutynin  5 mg Oral TID   Continuous Infusions:  sodium chloride 150 mL/hr at 02/11/21 1711   meropenem (MERREM) IV 1 g (02/11/21 1442)    LOS: 3 days   Roxan Hockey M.D on 02/11/2021 at 5:50 PM  Go to [email or web address] - for contact info  [facility] - Office  [phone]  If 7PM-7AM, please contact night-coverage [email or web address] Password [email or web address] 02/11/2021, 5:50 PM

## 2021-02-12 LAB — CBC
HCT: 28.7 % — ABNORMAL LOW (ref 36.0–46.0)
Hemoglobin: 9.6 g/dL — ABNORMAL LOW (ref 12.0–15.0)
MCH: 33.7 pg (ref 26.0–34.0)
MCHC: 33.4 g/dL (ref 30.0–36.0)
MCV: 100.7 fL — ABNORMAL HIGH (ref 80.0–100.0)
Platelets: 163 10*3/uL (ref 150–400)
RBC: 2.85 MIL/uL — ABNORMAL LOW (ref 3.87–5.11)
RDW: 11.9 % (ref 11.5–15.5)
WBC: 4.1 10*3/uL (ref 4.0–10.5)
nRBC: 0 % (ref 0.0–0.2)

## 2021-02-12 LAB — BASIC METABOLIC PANEL
Anion gap: 4 — ABNORMAL LOW (ref 5–15)
BUN: 9 mg/dL (ref 8–23)
CO2: 24 mmol/L (ref 22–32)
Calcium: 7.6 mg/dL — ABNORMAL LOW (ref 8.9–10.3)
Chloride: 111 mmol/L (ref 98–111)
Creatinine, Ser: 0.57 mg/dL (ref 0.44–1.00)
GFR, Estimated: >60 mL/min (ref >60)
Glucose, Bld: 85 mg/dL (ref 70–99)
Potassium: 2.8 mmol/L — ABNORMAL LOW (ref 3.5–5.1)
Sodium: 139 mmol/L (ref 135–145)

## 2021-02-12 MED ORDER — POTASSIUM CHLORIDE CRYS ER 20 MEQ PO TBCR
40.0000 meq | EXTENDED_RELEASE_TABLET | ORAL | Status: DC
  Administered 2021-02-12: 40 meq via ORAL
  Filled 2021-02-12: qty 2

## 2021-02-12 MED ORDER — CIPROFLOXACIN HCL 500 MG PO TABS: 500.0000 mg | ORAL_TABLET | Freq: Two times a day (BID) | ORAL | 0 refills | Status: AC

## 2021-02-12 MED ORDER — POTASSIUM CHLORIDE ER 20 MEQ PO TBCR
20.0000 meq | EXTENDED_RELEASE_TABLET | Freq: Every day | ORAL | 0 refills | Status: DC
Start: 2021-02-12 — End: 2023-06-13

## 2021-02-12 NOTE — Care Management Important Message (Signed)
Important Message  Patient Details  Name: [name] MRN: [id number] Date of Birth: [birth year]   Medicare Important Message Given:  Yes     Tommy Medal 02/12/2021, 10:46 AM

## 2021-02-12 NOTE — Progress Notes (Signed)
Foley has been removed and IV's removed, as well. Discharge instructions reviewed with patient and husband and scripts sent to pharmacy.  To follow up with primary MD on 20th.  Transported by WC to main entrance and husband to drive home.

## 2021-02-12 NOTE — Discharge Instructions (Addendum)
1)Please take ciprofloxacin antibiotic for the next 5 days as prescribed starting today-- 2)Please hold Keflex/cephalexin antibiotic while on Ciprofloxacillin 3)Please follow-up with urologist within a week for recheck and Re-evaluation 4)Please drink plenty fluids, avoid dehydration 5)Please avoid excessive self-catheterization as this leads to increased risk for recurrent urinary tract infections 6)Please repeat the CBC and BMP blood test on Friday, January 20 th, 2023  7)I am sorry your husband could Not travel to [place] for the planned pilgrimage trip----(Romans 8:28 -- " And we know that all things work together for good to them that love God, to them who are the called according to his purpose")

## 2021-02-12 NOTE — Discharge Summary (Signed)
[name], is a 66 y.o. female  DOB 05-10-1955  MRN [id number].  Admission date:  02/08/2021  Admitting Physician  Bethena Roys, MD  Discharge Date:  02/12/2021   Primary MD  Aletha Halim., PA-C  Recommendations for primary care physician for things to follow:   1)Please take ciprofloxacin antibiotic for the next 5 days as prescribed starting today-- 2)Please hold Keflex/cephalexin antibiotic while on Ciprofloxacillin 3)Please follow-up with urologist within a week for recheck and Re-evaluation 4)Please drink plenty fluids, avoid dehydration 5)Please avoid excessive self-catheterization as this leads to increased risk for recurrent urinary tract infections 6)Please repeat the CBC and BMP blood test on Friday, January 20 th, 2023  7)I am sorry your husband could Not travel to [place] for the planned pilgrimage trip----(Romans 8:28 -- " And we know that all things work together for good to them that love God, to them who are the called according to his purpose")   Admission Diagnosis  Acute cystitis without hematuria [N30.00] Sepsis ([place]) [A41.9] Sepsis, due to unspecified organism, unspecified whether acute organ dysfunction present [facility]) [A41.9]   Discharge Diagnosis  Acute cystitis without hematuria [N30.00] Sepsis ([place]) [A41.9] Sepsis, due to unspecified organism, unspecified whether acute organ dysfunction present [facility]) [A41.9]    Principal Problem:   Sepsis ([place]) Active Problems:   UTI (urinary tract infection)   Multiple sclerosis ([place])   Self-catheterizes urinary bladder      Past Medical History:  Diagnosis Date   Abdominal fluid collection 05/07/2012   Abdominal pain, RUQ (right upper quadrant) 06/15/2012   Abdominal wall fluid collections 06/15/2012   Headache(784.0)    "several/wk" (05/17/2012)   History of blood transfusion ~ 2012   "after multiple UTI's; got bladder  infection; passed alot of blood" (05/05/2012)   History of recurrent UTIs    Hypertension    Multiple sclerosis ([place]) 01/28/1970   Neuromuscular disorder ([place])    MS   Scoliosis    Self-catheterizes urinary bladder ~ 2012   "sphincter in bladder doesn't work properly due to my MS" (05/17/2012   Shingles outbreak 08/29/2011   Small bowel obstruction ([place]) 06/17/2012    Past Surgical History:  Procedure Laterality Date   LAPAROTOMY N/A 06/17/2012   Procedure: EXPLORATORY LAPAROTOMY FOR  SMALL BOWEL RESECTION;  Surgeon: Harl Bowie, MD;  Location: [place];  Service: General;  Laterality: N/A;   VAGINAL HYSTERECTOMY  2011   WOUND DEBRIDEMENT Bilateral 06/13/2012   Procedure: DEBRIDEMENT ABDOMINAL WOUND;  Surgeon: Stark Klein, MD;  Location: [place];  Service: General;  Laterality: Bilateral;      HPI  from the history and physical done on the day of admission:    Chief Complaint: Pain with urination, , fever    HPI: [name] is a 66 y.o. female with medical history significant for multiple sclerosis, small bowel obstruction, scoliosis. Patient presented to ED with complaints of urinary frequency and pain with urination that started last night.  She also reports chills rigors and fevers.  1  episode of vomiting.  Patient self caths secondary to her multiple sclerosis, she reports seeing small amounts of blood occasionally.  Patient has been self cathing herself for about 8 years now.  She gets frequent UTIs.  For this she was placed on low-dose Keflex daily and she has been on this for years also.  She reports compliance with this.  She is usually prescribed Levaquin for her UTIs.  Last UTI was about 4 months ago, diagnosed and treated as outpatient.  She did better after her course of Levaquin. She denies difficulty breathing, no cough.  No chest pain.  No abdominal pain no loose stools.   ED Course: Patient 102.7.  Heart rate 103-142.  Respiratory rate 19-25.  Blood pressure systolic  696E to 952W.  Sats greater than 95%.  WBC 18.7.  Chest x-ray clear.  UA suggestive of UTI.  Lactic acid 0.8.  IV ceftriaxone 2 g daily started.  Hospitalist to admit.     Hospital Course:    Brief Narrative:  66 y.o. female with medical history significant for multiple sclerosis, small bowel obstruction, scoliosis-history of recurrent UTIs in the setting of self-catheterization due to neurogenic bladder secondary to multiple sclerosis admitted on 02/08/2021 with sepsis secondary to urinary source   A/p 1)MDR Enterobacter Cl[place]ae sepsis secondary to urinary source--POA- -Patient with complicated UTI given recurrent self-catheterization -History of MDR E. coli about 6 months ago -history of recurrent UTIs in the setting of self-catheterization due to neurogenic bladder secondary to multiple sclerosis admitted on 02/08/2021 with sepsis secondary to urinary source -No further fevers, chills -Continue oxybutynin -Sepsis pathophysiology appears to have resolved -Initially treated with IV meropenem, okay to discharge on p.o. Cipro -Outpatient follow-up with urologist advised -Avoid excessive self-catheterization  2)Multiple Sclerosis with Neurogenic bladder and ambulatory dysfunction--- supportive care--- PTA patient was self catheterizing,  -Foley removed prior to discharge  3) acute anemia--- suspect this is hemodilution on repeat CBC as advised as outpatient -No evidence of overt bleeding, patient is hemodynamically stable  4) hypokalemia--- replaced, repeat BMP as outpatient as advised   Disposition--Home with husband   Disposition: The patient is from: Home              Anticipated d/c is to: Home                Code Status :  -  Code Status: Full Code    Family Communication:    (patient is alert, awake and coherent)  Discussed with husband at bedside  Discharge Condition: Stable  Follow UP   Follow-up Information     Aletha Halim., PA-C. Schedule an appointment as  soon as possible for a visit.   Specialty: Family Medicine Why: Repeat CBC and BMP on Friday 02/16/2021 Contact information: [address] [place] [address] [phone]                Diet and Activity recommendation:  As advised  Discharge Instructions    Discharge Instructions     Call MD for:  difficulty breathing, headache or visual disturbances   Complete by: As directed    Call MD for:  persistant dizziness or light-headedness   Complete by: As directed    Call MD for:  persistant nausea and vomiting   Complete by: As directed    Call MD for:  temperature >100.4   Complete by: As directed    Diet - low sodium heart healthy   Complete by: As directed  Discharge instructions   Complete by: As directed    1)Please take ciprofloxacin antibiotic for the next 5 days as prescribed starting today-- 2)Please hold Keflex/cephalexin antibiotic while on Ciprofloxacillin 3)Please follow-up with urologist within a week for recheck and Re-evaluation 4)Please drink plenty fluids, avoid dehydration 5)Please avoid excessive self-catheterization as this leads to increased risk for recurrent urinary tract infections 6)Please repeat the CBC and BMP blood test on Friday, January 20 th, 2023  7)I am sorry your husband could Not travel to [place] for the planned pilgrimage trip----(Romans 8:28 -- " And we know that all things work together for good to them that love God, to them who are the called according to his purpose")   Increase activity slowly   Complete by: As directed         Discharge Medications     Allergies as of 02/12/2021       Reactions   Oxycodone Nausea And Vomiting        Medication List     STOP taking these medications    ibuprofen 200 MG tablet Commonly known as: ADVIL   metroNIDAZOLE 500 MG tablet Commonly known as: Flagyl   traMADol 50 MG tablet Commonly known as: ULTRAM       TAKE these medications    CALCIUM 600 + D  PO Take 1 tablet by mouth daily.   cephALEXin 250 MG capsule Commonly known as: KEFLEX Take 250 mg by mouth daily.   Cholecalciferol 50 MCG (2000 UT) Tabs D3 DOTS 50 mcg (2,000 unit) tablet   ciprofloxacin 500 MG tablet Commonly known as: Cipro Take 1 tablet (500 mg total) by mouth 2 (two) times daily for 5 days. What changed: when to take this   DSS 100 MG Caps Take 100 mg by mouth daily. Do not take if your having abdominal pain or nausea   EXCEDRIN PO Take by mouth.   ferrous sulfate 325 (65 FE) MG tablet Take 325 mg by mouth daily with breakfast.   multivitamin tablet Take 1 tablet by mouth daily.   OSTEO BI-FLEX ADV JOINT SHIELD PO Take 1 tablet by mouth daily.   oxybutynin 5 MG tablet Commonly known as: DITROPAN Take 5 mg by mouth 3 (three) times daily.   Potassium Chloride ER 20 MEQ Tbcr Take 20 mEq by mouth daily for 5 days. 1 tab daily by mouth        Major procedures and Radiology Reports - PLEASE review detailed and final reports for all details, in brief -   DG Chest Port 1 View  Result Date: 02/08/2021 CLINICAL DATA:  Fever.  Tachycardia.  UTI.  Question sepsis. EXAM: PORTABLE CHEST 1 VIEW COMPARISON:  09/07/2010 FINDINGS: Artifact overlies the chest. Heart size is normal. There is aortic atherosclerosis. The lungs are clear. The vascularity is normal. No effusions. There is curvature of the spine which could be positional. IMPRESSION: No active disease. Electronically Signed   By: Nelson Chimes M.D.   On: 02/08/2021 14:44    Micro Results    Recent Results (from the past 240 hour(s))  Blood Culture (routine x 2)     Status: None (Preliminary result)   Collection Time: 02/08/21  3:00 PM   Specimen: BLOOD RIGHT HAND  Result Value Ref Range Status   Specimen Description BLOOD RIGHT HAND  Final   Special Requests   Final    BOTTLES DRAWN AEROBIC AND ANAEROBIC Blood Culture adequate volume   Culture   Final  NO GROWTH 3 DAYS Performed at [facility], [address] [place], [place] [address]    Report Status PENDING  Incomplete  Blood Culture (routine x 2)     Status: None (Preliminary result)   Collection Time: 02/08/21  3:10 PM   Specimen: BLOOD  Result Value Ref Range Status   Specimen Description BLOOD  Final   Special Requests BLOOD  Final   Culture   Final    NO GROWTH 3 DAYS Performed at [facility], [address] [place], [place] [address]    Report Status PENDING  Incomplete  Resp Panel by RT-PCR (Flu A&B, Covid) Nasopharyngeal Swab     Status: None   Collection Time: 02/08/21  3:44 PM   Specimen: Nasopharyngeal Swab; Nasopharyngeal(NP) swabs in vial transport medium  Result Value Ref Range Status   SARS Coronavirus 2 by RT PCR NEGATIVE NEGATIVE Final    Comment: (NOTE) SARS-CoV-2 target nucleic acids are NOT DETECTED.  The SARS-CoV-2 RNA is generally detectable in upper respiratory specimens during the acute phase of infection. The lowest concentration of SARS-CoV-2 viral copies this assay can detect is 138 copies/mL. A negative result does not preclude SARS-Cov-2 infection and should not be used as the sole basis for treatment or other patient management decisions. A negative result may occur with  improper specimen collection/handling, submission of specimen other than nasopharyngeal swab, presence of viral mutation(s) within the areas targeted by this assay, and inadequate number of viral copies(<138 copies/mL). A negative result must be combined with clinical observations, patient history, and epidemiological information. The expected result is Negative.  Fact Sheet for Patients:  EntrepreneurPulse.com.au  Fact Sheet for Healthcare Providers:  IncredibleEmployment.be  This test is no t yet approved or cleared by the [place] FDA and  has been authorized for detection and/or diagnosis of SARS-CoV-2 by FDA under an Emergency Use Authorization (EUA). This EUA  will remain  in effect (meaning this test can be used) for the duration of the COVID-19 declaration under Section 564(b)(1) of the Act, 21 U.S.C.section 360bbb-3(b)(1), unless the authorization is terminated  or revoked sooner.       Influenza A by PCR NEGATIVE NEGATIVE Final   Influenza B by PCR NEGATIVE NEGATIVE Final    Comment: (NOTE) The Xpert Xpress SARS-CoV-2/FLU/RSV plus assay is intended as an aid in the diagnosis of influenza from Nasopharyngeal swab specimens and should not be used as a sole basis for treatment. Nasal washings and aspirates are unacceptable for Xpert Xpress SARS-CoV-2/FLU/RSV testing.  Fact Sheet for Patients: EntrepreneurPulse.com.au  Fact Sheet for Healthcare Providers: IncredibleEmployment.be  This test is not yet approved or cleared by the [place] FDA and has been authorized for detection and/or diagnosis of SARS-CoV-2 by FDA under an Emergency Use Authorization (EUA). This EUA will remain in effect (meaning this test can be used) for the duration of the COVID-19 declaration under Section 564(b)(1) of the Act, 21 U.S.C. section 360bbb-3(b)(1), unless the authorization is terminated or revoked.  Performed at [facility], [address], [place], [place] [address]   Urine Culture     Status: Abnormal   Collection Time: 02/08/21  3:44 PM   Specimen: Urine, Catheterized  Result Value Ref Range Status   Specimen Description   Final    URINE, CATHETERIZED Performed at [facility], [address] [place], [place] [address]    Special Requests   Final    NONE Performed at [facility], [address] [place], [place] [address]  Culture (A)  Final    20,000 COLONIES/mL ENTEROBACTER CLOACAE Two isolates with different morphologies were identified as the same organism.The most resistant organism was reported. Performed at [facility], [place] [address] [place], [place] [address]    Report Status  02/11/2021 FINAL  Final   Organism ID, Bacteria ENTEROBACTER CLOACAE (A)  Final      Susceptibility   Enterobacter cloacae - MIC*    CEFAZOLIN >=64 RESISTANT Resistant     CEFEPIME <=0.12 SENSITIVE Sensitive     CIPROFLOXACIN <=0.25 SENSITIVE Sensitive     GENTAMICIN <=1 SENSITIVE Sensitive     IMIPENEM <=0.25 SENSITIVE Sensitive     NITROFURANTOIN 64 INTERMEDIATE Intermediate     TRIMETH/SULFA <=20 SENSITIVE Sensitive     PIP/TAZO 64 INTERMEDIATE Intermediate     * 20,000 COLONIES/mL ENTEROBACTER CLOACAE  Culture, blood (Routine X 2) w Reflex to ID Panel     Status: None (Preliminary result)   Collection Time: 02/10/21  4:57 AM   Specimen: Left Antecubital; Blood  Result Value Ref Range Status   Specimen Description   Final    LEFT ANTECUBITAL BOTTLES DRAWN AEROBIC AND ANAEROBIC   Special Requests   Final    Blood Culture results may not be optimal due to an excessive volume of blood received in culture bottles   Culture   Final    NO GROWTH 1 DAY Performed at [facility], [address], [place], [place] [address]    Report Status PENDING  Incomplete  Culture, blood (Routine X 2) w Reflex to ID Panel     Status: None (Preliminary result)   Collection Time: 02/10/21  5:05 AM   Specimen: BLOOD LEFT HAND  Result Value Ref Range Status   Specimen Description   Final    BLOOD LEFT HAND BOTTLES DRAWN AEROBIC AND ANAEROBIC   Special Requests Blood Culture adequate volume  Final   Culture   Final    NO GROWTH 1 DAY Performed at [facility], [address] [place], [place] [address]    Report Status PENDING  Incomplete   Today   Subjective    Caron Ode today has no new complaints No fever  Or chills   No Nausea, Vomiting or Diarrhea          Patient has been seen and examined prior to discharge   Objective   Blood pressure 128/67, pulse 76, temperature 98.6 F (37 C), temperature source Oral, resp. rate 18, height 5\' 2"  (1.575 m), weight 51.2 kg, SpO2 99  %.   Intake/Output Summary (Last 24 hours) at 02/12/2021 1121 Last data filed at 02/12/2021 0800 Gross per 24 hour  Intake 4171.33 ml  Output 1150 ml  Net 3021.33 ml   Exam Gen:- Awake Alert, no acute distress  HEENT:- [place].AT, No sclera icterus Neck-Supple Neck,No JVD,.  Lungs-  CTAB , good air movement bilaterally  CV- S1, S2 normal, regular Abd-  +ve B.Sounds, Abd Soft, No tenderness, no CVA area tenderness Extremity/Skin:- No  edema,   good pulses Psych-affect is appropriate, oriented x3 Neuro-no new focal deficits, no tremors    Data Review   CBC w Diff:  Lab Results  Component Value Date   WBC 4.1 02/12/2021   HGB 9.6 (L) 02/12/2021   HCT 28.7 (L) 02/12/2021   PLT 163 02/12/2021   LYMPHOPCT 5 02/08/2021   MONOPCT 6 02/08/2021   EOSPCT 0 02/08/2021   BASOPCT 0 02/08/2021    CMP:  Lab Results  Component Value Date   NA 139 02/12/2021   K 2.8 (L) 02/12/2021   CL 111 02/12/2021   CO2 24 02/12/2021   BUN 9 02/12/2021   CREATININE 0.57 02/12/2021   PROT 5.7 (L) 02/10/2021   ALBUMIN 2.8 (L) 02/10/2021   BILITOT 0.6 02/10/2021   ALKPHOS 58 02/10/2021   AST 26 02/10/2021   ALT 19 02/10/2021  .   Total Discharge time is about 33 minutes  Roxan Hockey M.D on 02/12/2021 at 11:21 AM  Go to [email or web address] -  for contact info  [facility] - Office  [phone]

## 2021-02-12 NOTE — Progress Notes (Signed)
Sepsis due to uti, vitals now within normal range and to be discharged today without foley.  Self caths at home.  Walked with walker and standby to bathroom for bm this morning.  Husband at bedside.

## 2021-02-13 LAB — CULTURE, BLOOD (ROUTINE X 2)
Culture: NO GROWTH
Culture: NO GROWTH
Special Requests: ADEQUATE

## 2021-02-15 LAB — CULTURE, BLOOD (ROUTINE X 2)
Culture: NO GROWTH
Culture: NO GROWTH
Special Requests: ADEQUATE

## 2021-03-04 ENCOUNTER — Emergency Department (HOSPITAL_COMMUNITY)
Admission: EM | Admit: 2021-03-04 | Discharge: 2021-03-04 | Disposition: A | Discharge status: Home / Self Care | Payer: Medicare HMO | Attending: Emergency Medicine | Admitting: Emergency Medicine

## 2021-03-04 ENCOUNTER — Encounter (HOSPITAL_COMMUNITY): Payer: Self-pay | Admitting: Emergency Medicine

## 2021-03-04 DIAGNOSIS — T839XXA Unspecified complication of genitourinary prosthetic device, implant and graft, initial encounter: Secondary | ICD-10-CM

## 2021-03-04 DIAGNOSIS — T83038A Leakage of other indwelling urethral catheter, initial encounter: Secondary | ICD-10-CM | POA: Diagnosis present

## 2021-03-04 DIAGNOSIS — Y732 Prosthetic and other implants, materials and accessory gastroenterology and urology devices associated with adverse incidents: Secondary | ICD-10-CM | POA: Diagnosis not present

## 2021-03-04 DIAGNOSIS — I10 Essential (primary) hypertension: Secondary | ICD-10-CM | POA: Diagnosis not present

## 2021-03-04 NOTE — Discharge Instructions (Signed)
You were evaluated in the Emergency Department and after careful evaluation, we did not find any emergent condition requiring admission or further testing in the hospital.  Your exam/testing today was overall reassuring.  Suspect your catheter was temporarily clogged but is now functioning normally.  Please return to the Emergency Department if you experience any worsening of your condition.  Thank you for allowing [place] to be a part of your care. `

## 2021-03-04 NOTE — ED Provider Notes (Signed)
[facility] Emergency Department Provider Note MRN:  [id number]  Arrival date & time: 03/04/21     Chief Complaint   Foley Problem   History of Present Illness   [name] is a 66 y.o. year-old female with a history of multiple sclerosis presenting to the ED with chief complaint of Foley problem.  Patient woke up and noticed that she was leaking urine around her catheter and her bag was empty.  Was also experiencing some burning or discomfort in the vaginal or lower pelvic area.  Came in for evaluation.  Symptoms have resolved, Foley catheter bag is now filling with urine.  Review of Systems  A thorough review of systems was obtained and all systems are negative except as noted in the HPI and PMH.   Patient's Health History    Past Medical History:  Diagnosis Date   Abdominal fluid collection 05/07/2012   Abdominal pain, RUQ (right upper quadrant) 06/15/2012   Abdominal wall fluid collections 06/15/2012   Headache(784.0)    "several/wk" (05/17/2012)   History of blood transfusion ~ 2012   "after multiple UTI's; got bladder infection; passed alot of blood" (05/05/2012)   History of recurrent UTIs    Hypertension    Multiple sclerosis ([place]) 01/28/1970   Neuromuscular disorder ([place])    MS   Scoliosis    Self-catheterizes urinary bladder ~ 2012   "sphincter in bladder doesn't work properly due to my MS" (05/17/2012   Shingles outbreak 08/29/2011   Small bowel obstruction ([place]) 06/17/2012    Past Surgical History:  Procedure Laterality Date   LAPAROTOMY N/A 06/17/2012   Procedure: EXPLORATORY LAPAROTOMY FOR  SMALL BOWEL RESECTION;  Surgeon: Harl Bowie, MD;  Location: [place];  Service: General;  Laterality: N/A;   VAGINAL HYSTERECTOMY  2011   WOUND DEBRIDEMENT Bilateral 06/13/2012   Procedure: DEBRIDEMENT ABDOMINAL WOUND;  Surgeon: Stark Klein, MD;  Location[facility];  Service: General;  Laterality: Bilateral;    Family History  Problem  Relation Age of Onset   Heart disease Mother    Congestive Heart Failure Mother    Heart disease Father    Stroke Father    Heart disease Brother    Heart attack Brother     Social History   Socioeconomic History   Marital status: Married    Spouse name: Not on file   Number of children: Not on file   Years of education: Not on file   Highest education level: Not on file  Occupational History   Not on file  Tobacco Use   Smoking status: Never   Smokeless tobacco: Never  Substance and Sexual Activity   Alcohol use: No   Drug use: No   Sexual activity: Yes  Other Topics Concern   Not on file  Social History Narrative   Not on file   Social Determinants of Health   Financial Resource Strain: Not on file  Food Insecurity: Not on file  Transportation Needs: Not on file  Physical Activity: Not on file  Stress: Not on file  Social Connections: Not on file  Intimate Partner Violence: Not on file     Physical Exam   Vitals:   03/04/21 0526 03/04/21 0530  BP: (!) 160/69 (!) 160/74  Pulse: (!) 102 97  Resp: 17   Temp: 97.7 F (36.5 C)   SpO2: 100% 100%    CONSTITUTIONAL: Well-appearing, NAD NEURO/PSYCH:  Alert and oriented x 3, no focal deficits EYES:  eyes  equal and reactive ENT/NECK:  no LAD, no JVD CARDIO: Regular rate, well-perfused, normal S1 and S2 PULM:  CTAB no wheezing or rhonchi GI/GU:  non-distended, non-tender MSK/SPINE:  No gross deformities, no edema SKIN:  no rash, atraumatic   *Additional and/or pertinent findings included in MDM below  Diagnostic and Interventional Summary    EKG Interpretation  Date/Time:    Ventricular Rate:    PR Interval:    QRS Duration:   QT Interval:    QTC Calculation:   R Axis:     Text Interpretation:         Labs Reviewed - No data to display  No orders to display    Medications - No data to display   Procedures  /  Critical Care Procedures  ED Course and Medical Decision Making  Initial  Impression and Ddx Suspect a transient obstruction in the Foley catheter which has now resolved.  No symptoms, catheter bag feeling easily with urine.  Abdomen soft and nontender.  Provided reassurance.  Past medical/surgical history that increases complexity of ED encounter: None  Interpretation of Diagnostics None  Patient Reassessment and Ultimate Disposition/Management Discharge home  Patient management required discussion with the following services or consulting groups:  None  Complexity of Problems Addressed Acute complicated illness or Injury  Additional Data Reviewed and Analyzed Further history obtained from: Recent discharge summary  Factors Impacting ED Encounter Risk None  Barth Kirks. Sedonia Small, MD [place] [email or web address]  Final Clinical Impressions(s) / ED Diagnoses     ICD-10-CM   1. Problem with Foley catheter, initial encounter ([place])  [email or web address]       ED Discharge Orders     None        Discharge Instructions Discussed with and Provided to Patient:    Discharge Instructions      You were evaluated in the Emergency Department and after careful evaluation, we did not find any emergent condition requiring admission or further testing in the hospital.  Your exam/testing today was overall reassuring.  Suspect your catheter was temporarily clogged but is now functioning normally.  Please return to the Emergency Department if you experience any worsening of your condition.  Thank you for allowing [place] to be a part of your care. Maudie Flakes, MD 03/04/21 [phone]

## 2021-03-04 NOTE — ED Triage Notes (Signed)
Pt had indwelling foley placed 10 days ago due to sepsis from UTI. Tonight pt woke up with burning to vaginal area and noticed that the foley was leaking and had no urine in the bag.

## 2021-11-18 ENCOUNTER — Other Ambulatory Visit: Payer: Self-pay

## 2021-11-18 ENCOUNTER — Encounter (HOSPITAL_COMMUNITY): Payer: Self-pay | Admitting: *Deleted

## 2021-11-18 ENCOUNTER — Emergency Department (HOSPITAL_COMMUNITY)
Admission: EM | Admit: 2021-11-18 | Discharge: 2021-11-18 | Disposition: A | Discharge status: Home / Self Care | Payer: PRIVATE HEALTH INSURANCE | Attending: Emergency Medicine | Admitting: Emergency Medicine

## 2021-11-18 DIAGNOSIS — R1900 Intra-abdominal and pelvic swelling, mass and lump, unspecified site: Secondary | ICD-10-CM | POA: Insufficient documentation

## 2021-11-18 DIAGNOSIS — T839XXA Unspecified complication of genitourinary prosthetic device, implant and graft, initial encounter: Secondary | ICD-10-CM | POA: Diagnosis not present

## 2021-11-18 DIAGNOSIS — R319 Hematuria, unspecified: Secondary | ICD-10-CM | POA: Diagnosis not present

## 2021-11-18 HISTORY — DX: Age-related osteoporosis without current pathological fracture: M81.0

## 2021-11-18 LAB — URINALYSIS, ROUTINE W REFLEX MICROSCOPIC

## 2021-11-18 LAB — CBC WITH DIFFERENTIAL/PLATELET
Abs Immature Granulocytes: 0.03 10*3/uL (ref 0.00–0.07)
Basophils Absolute: 0 10*3/uL (ref 0.0–0.1)
Basophils Relative: 0 %
Eosinophils Absolute: 0.1 10*3/uL (ref 0.0–0.5)
Eosinophils Relative: 1 %
HCT: 38.6 % (ref 36.0–46.0)
Hemoglobin: 12.7 g/dL (ref 12.0–15.0)
Immature Granulocytes: 0 %
Lymphocytes Relative: 31 %
Lymphs Abs: 2.5 10*3/uL (ref 0.7–4.0)
MCH: 33.2 pg (ref 26.0–34.0)
MCHC: 32.9 g/dL (ref 30.0–36.0)
MCV: 100.8 fL — ABNORMAL HIGH (ref 80.0–100.0)
Monocytes Absolute: 0.5 10*3/uL (ref 0.1–1.0)
Monocytes Relative: 7 %
Neutro Abs: 4.8 10*3/uL (ref 1.7–7.7)
Neutrophils Relative %: 61 %
Platelets: 278 10*3/uL (ref 150–400)
RBC: 3.83 MIL/uL — ABNORMAL LOW (ref 3.87–5.11)
RDW: 12.7 % (ref 11.5–15.5)
WBC: 8 10*3/uL (ref 4.0–10.5)
nRBC: 0 % (ref 0.0–0.2)

## 2021-11-18 LAB — URINALYSIS, MICROSCOPIC (REFLEX): RBC / HPF: >50 RBC/hpf (ref 0–5)

## 2021-11-18 LAB — BASIC METABOLIC PANEL
Anion gap: 9 (ref 5–15)
BUN: 15 mg/dL (ref 8–23)
CO2: 28 mmol/L (ref 22–32)
Calcium: 9.5 mg/dL (ref 8.9–10.3)
Chloride: 105 mmol/L (ref 98–111)
Creatinine, Ser: 0.87 mg/dL (ref 0.44–1.00)
GFR, Estimated: >60 mL/min (ref >60)
Glucose, Bld: 106 mg/dL — ABNORMAL HIGH (ref 70–99)
Potassium: 4.2 mmol/L (ref 3.5–5.1)
Sodium: 142 mmol/L (ref 135–145)

## 2021-11-18 MED ORDER — HYDROCODONE-ACETAMINOPHEN 5-325 MG PO TABS
1.0000 | ORAL_TABLET | Freq: Once | ORAL | Status: AC
  Administered 2021-11-18: 1 via ORAL
  Filled 2021-11-18: qty 1

## 2021-11-18 NOTE — Discharge Instructions (Signed)
Follow-up with urology as needed.  Urine has been sent for culture will be notified if it grows any significant bacteria suggestive of infection.  Patient urinalysis and not consistent with infection at this time.

## 2021-11-18 NOTE — ED Notes (Signed)
When doing patients bladder scan, pt C/O a burning and pressure sensation. This nurse tech informed patients RN.

## 2021-11-18 NOTE — ED Provider Notes (Addendum)
[facility] EMERGENCY DEPARTMENT Provider Note   CSN: [id number] Arrival date & time: 11/18/21  1700     History  Chief Complaint  Patient presents with   Hematuria    ALANEE TING is a 66 y.o. female.  Patient with permanent indwelling Foley catheter since January secondary to her MS.  Patient has the catheter changed out monthly.  It was changed on October 17 as done by [facility].  Unfortunately have no notes from their clinics.  There was a problem with the Foley catheter following a CT scan that was routine surveillance that was done October 20.  Patient developed blood in the Foley catheter it sounds as if it clotted off.  They tried to irrigate and then they replaced the catheter.  Patient here today that the bleeding in her bag is gotten darker since Friday.  It actually looks like fairly dilute blood mostly urine.  No clots but there is some leakage around the catheter insertion site.  And patient feels a lot of discomfort and burning sensation.  Patient had some of that on Friday talks about getting a shot of pain medicine.  Patient not currently on any antibiotics.       Home Medications Prior to Admission medications   Medication Sig Start Date End Date Taking? Authorizing Provider  Aspirin-Acetaminophen-Caffeine (EXCEDRIN PO) Take by mouth.    [provider]  Calcium Carbonate-Vitamin D (CALCIUM 600 + D PO) Take 1 tablet by mouth daily.    [provider]  cephALEXin (KEFLEX) 250 MG capsule Take 250 mg by mouth daily.    [provider]  Cholecalciferol 50 MCG (2000 UT) TABS D3 DOTS 50 mcg (2,000 unit) tablet    [provider]  docusate sodium 100 MG CAPS Take 100 mg by mouth daily. Do not take if your having abdominal pain or nausea Patient not taking: Reported on 02/09/2021 08/06/13   Thurnell Lose, MD  ferrous sulfate 325 (65 FE) MG tablet Take 325 mg by mouth daily with breakfast.    [provider]  Misc  Natural Products (OSTEO BI-FLEX ADV JOINT SHIELD PO) Take 1 tablet by mouth daily.    [provider]  Multiple Vitamin (MULTIVITAMIN) tablet Take 1 tablet by mouth daily.    [provider]  oxybutynin (DITROPAN) 5 MG tablet Take 5 mg by mouth 3 (three) times daily.  06/02/12   [provider]  Potassium Chloride ER 20 MEQ TBCR Take 20 mEq by mouth daily for 5 days. 1 tab daily by mouth 02/12/21 02/17/21  Roxan Hockey, MD      Allergies    Patient has no active allergies.    Review of Systems   Review of Systems  Constitutional:  Negative for chills and fever.  HENT:  Negative for ear pain and sore throat.   Eyes:  Negative for pain and visual disturbance.  Respiratory:  Negative for cough and shortness of breath.   Cardiovascular:  Negative for chest pain and palpitations.  Gastrointestinal:  Negative for abdominal pain and vomiting.  Genitourinary:  Positive for dysuria and hematuria.  Musculoskeletal:  Negative for arthralgias and back pain.  Skin:  Negative for color change and rash.  Neurological:  Negative for seizures and syncope.  All other systems reviewed and are negative.   Physical Exam Updated Vital Signs BP (!) 172/97   Pulse 89   Temp 97.8 F (36.6 C) (Oral)   Resp 18   Ht 1.448 m (  $'4\' 9"'V$ )   Wt 50.8 kg   SpO2 99%   BMI 24.24 kg/m  Physical Exam Vitals and nursing note reviewed.  Constitutional:      General: She is not in acute distress.    Appearance: Normal appearance. She is well-developed.  HENT:     Head: Normocephalic and atraumatic.     Mouth/Throat:     Mouth: Mucous membranes are moist.  Eyes:     Extraocular Movements: Extraocular movements intact.     Conjunctiva/sclera: Conjunctivae normal.     Pupils: Pupils are equal, round, and reactive to light.  Cardiovascular:     Rate and Rhythm: Normal rate and regular rhythm.     Heart sounds: No murmur heard. Pulmonary:     Effort: Pulmonary effort is normal. No  respiratory distress.     Breath sounds: Normal breath sounds.  Abdominal:     Palpations: Abdomen is soft. There is mass.     Tenderness: There is no abdominal tenderness.     Comments: Clinically feels as if the bladder is distended.  Musculoskeletal:        General: No swelling.     Cervical back: Normal range of motion and neck supple.     Right lower leg: No edema.     Left lower leg: No edema.  Skin:    General: Skin is warm and dry.     Capillary Refill: Capillary refill takes less than 2 seconds.  Neurological:     Mental Status: She is alert. Mental status is at baseline.  Psychiatric:        Mood and Affect: Mood normal.     ED Results / Procedures / Treatments   Labs (all labs ordered are listed, but only abnormal results are displayed) Labs Reviewed  CBC WITH DIFFERENTIAL/PLATELET - Abnormal; Notable for the following components:      Result Value   RBC 3.83 (*)    MCV 100.8 (*)    All other components within normal limits  BASIC METABOLIC PANEL - Abnormal; Notable for the following components:   Glucose, Bld 106 (*)    All other components within normal limits  URINE CULTURE  URINALYSIS, ROUTINE W REFLEX MICROSCOPIC    EKG None  Radiology No results found.  Procedures Procedures    Medications Ordered in ED Medications  HYDROcodone-acetaminophen (NORCO/VICODIN) 5-325 MG per tablet 1 tablet (has no administration in time range)    ED Course/ Medical Decision Making/ A&P                           Medical Decision Making Amount and/or Complexity of Data Reviewed Labs: ordered.  Risk Prescription drug management.   Clinically it feels as if the bladder is distended.  Bladder scan was done and the only showed 100 cc.  But patient still having a lot of discomfort.  We will give some hydrocodone.  Patient CBC and basic metabolic panel looks very normal.  Renal functions normal.  No leukocytosis.  Hemoglobin 12.7.  Platelets 278.  We will go ahead  and just change the Foley completely out I think the bladder scan may have been erroneous.  Urinalysis still pending and urine culture obviously still pending.  Changed Foley catheter out 1 a size larger no leakage patient feels much better.  Patient felt a lot better as soon as we removed the previous catheter it may have been in a location that was causing pain.  May have been wrenched on.  Urinalysis not consistent with urinary tract infection but urine sent for culture.  Patient stable for discharge home.  Urine still hematuria but not super concentrated and no signs of any clots.  Patient stable for discharge feels much better.   Final Clinical Impression(s) / ED Diagnoses Final diagnoses:  Hematuria, unspecified type  Problem with Foley catheter, initial encounter [facility])    Rx / [place] Orders ED Discharge Orders     None         Fredia Sorrow, MD 11/18/21 1013    Fredia Sorrow, MD 11/18/21 1156

## 2021-11-18 NOTE — ED Notes (Signed)
MD checked placement of foley and agrees it is in correct postioning d/t foley draining well.

## 2021-11-18 NOTE — ED Triage Notes (Signed)
Pt c/o hematuria since Friday after she had a CT scan to check her kidneys at [facility]. Pt has MS and did self catheterizations for awhile but due to multiple issues with recurrent UTIs she had a indwelling urinary catheter placed in January 2023. Pt reports at the office on Friday when she had the CT scan they noticed her catheter was stopped up due to blood clots. Pt reports they removed some of the blood clots with a syringe and they changed her catheter at that time. Pt reports the bleeding has gotten darker since Friday.

## 2021-11-19 LAB — URINE CULTURE: Culture: NO GROWTH

## 2022-01-12 ENCOUNTER — Encounter (HOSPITAL_COMMUNITY): Payer: Self-pay

## 2022-01-12 ENCOUNTER — Other Ambulatory Visit: Payer: Self-pay

## 2022-01-12 ENCOUNTER — Emergency Department (HOSPITAL_COMMUNITY)
Admission: EM | Admit: 2022-01-12 | Discharge: 2022-01-12 | Disposition: A | Discharge status: Home / Self Care | Payer: PRIVATE HEALTH INSURANCE | Attending: Emergency Medicine | Admitting: Emergency Medicine

## 2022-01-12 DIAGNOSIS — Z7982 Long term (current) use of aspirin: Secondary | ICD-10-CM | POA: Diagnosis not present

## 2022-01-12 DIAGNOSIS — R300 Dysuria: Secondary | ICD-10-CM | POA: Diagnosis not present

## 2022-01-12 DIAGNOSIS — T83098A Other mechanical complication of other indwelling urethral catheter, initial encounter: Secondary | ICD-10-CM | POA: Diagnosis present

## 2022-01-12 DIAGNOSIS — R1030 Lower abdominal pain, unspecified: Secondary | ICD-10-CM | POA: Insufficient documentation

## 2022-01-12 DIAGNOSIS — T83091A Other mechanical complication of indwelling urethral catheter, initial encounter: Secondary | ICD-10-CM

## 2022-01-12 LAB — URINALYSIS, ROUTINE W REFLEX MICROSCOPIC
Bilirubin Urine: NEGATIVE
Glucose, UA: NEGATIVE mg/dL
Ketones, ur: NEGATIVE mg/dL
Nitrite: NEGATIVE
Protein, ur: >=300 mg/dL — AB
RBC / HPF: >50 RBC/hpf — ABNORMAL HIGH (ref 0–5)
Specific Gravity, Urine: 1.009 (ref 1.005–1.030)
WBC, UA: >50 WBC/hpf — ABNORMAL HIGH (ref 0–5)
pH: 6 (ref 5.0–8.0)

## 2022-01-12 MED ORDER — SODIUM CHLORIDE 0.9 % IV BOLUS: 500.0000 mL | Freq: Once | INTRAVENOUS | Status: DC

## 2022-01-12 MED ORDER — MORPHINE SULFATE (PF) 4 MG/ML IV SOLN: 4.0000 mg | Freq: Once | INTRAVENOUS | Status: DC

## 2022-01-12 NOTE — Discharge Instructions (Signed)
As discussed, your evaluation today has been largely reassuring.  But, it is important that you monitor your condition carefully, and do not hesitate to return to the ED if you develop new, or concerning changes in your condition. ? ?Otherwise, please follow-up with your physician for appropriate ongoing care. ? ?

## 2022-01-12 NOTE — ED Triage Notes (Signed)
Pt has foley catheter in place and pt states she has not had any output since last night. Pt is having suprapubic pain.

## 2022-01-12 NOTE — ED Provider Notes (Signed)
[facility] EMERGENCY DEPARTMENT Provider Note   CSN: [id number] Arrival date & time: 01/12/22  1117     History  Chief Complaint  Patient presents with   Urinary Retention    [name] is a 66 y.o. female.  HPI Presents with her husband assists with history.  She presents due to acute onset suprapubic pain and dysuria.  She has a Foley catheter has had it for about 1 year due to multiple urologic complications.  She was well when she first woke up but soon thereafter felt pain which has been substantial since onset.  Pain is focally in the lower abdomen, no other complaints, no fever, vomiting, chills.    Home Medications Prior to Admission medications   Medication Sig Start Date End Date Taking? Authorizing Provider  Aspirin-Acetaminophen-Caffeine (EXCEDRIN PO) Take by mouth.    [provider]  Calcium Carbonate-Vitamin D (CALCIUM 600 + D PO) Take 1 tablet by mouth daily.    [provider]  cephALEXin (KEFLEX) 250 MG capsule Take 250 mg by mouth daily.    [provider]  Cholecalciferol 50 MCG (2000 UT) TABS D3 DOTS 50 mcg (2,000 unit) tablet    [provider]  docusate sodium 100 MG CAPS Take 100 mg by mouth daily. Do not take if your having abdominal pain or nausea Patient not taking: Reported on 02/09/2021 08/06/13   Thurnell Lose, MD  ferrous sulfate 325 (65 FE) MG tablet Take 325 mg by mouth daily with breakfast.    [provider]  Misc Natural Products (OSTEO BI-FLEX ADV JOINT SHIELD PO) Take 1 tablet by mouth daily.    [provider]  Multiple Vitamin (MULTIVITAMIN) tablet Take 1 tablet by mouth daily.    [provider]  oxybutynin (DITROPAN) 5 MG tablet Take 5 mg by mouth 3 (three) times daily.  06/02/12   [provider]  Potassium Chloride ER 20 MEQ TBCR Take 20 mEq by mouth daily for 5 days. 1 tab daily by mouth 02/12/21 02/17/21  Roxan Hockey, MD      Allergies    Patient has no  active allergies.    Review of Systems   Review of Systems  All other systems reviewed and are negative.   Physical Exam Updated Vital Signs BP 133/86 (BP Location: Right Arm)   Pulse 99   Temp 98.1 F (36.7 C) (Oral)   Resp (!) 22   Ht '4\' 11"'$  (1.499 m)   Wt 50.8 kg   SpO2 100%   BMI 22.62 kg/m  Physical Exam Vitals and nursing note reviewed.  Constitutional:      Appearance: She is well-developed.     Comments: Uncomfortable appearing elderly female awake alert  HENT:     Head: Normocephalic and atraumatic.  Eyes:     Conjunctiva/sclera: Conjunctivae normal.  Cardiovascular:     Rate and Rhythm: Normal rate and regular rhythm.  Pulmonary:     Effort: Pulmonary effort is normal. No respiratory distress.     Breath sounds: Normal breath sounds. No stridor.  Abdominal:     General: There is no distension.     Tenderness: There is abdominal tenderness. There is guarding.  Skin:    General: Skin is warm and dry.  Neurological:     Mental Status: She is alert and oriented to person, place, and time.     Cranial Nerves: No cranial nerve deficit.  Psychiatric:        Mood and  Affect: Mood normal.     ED Results / Procedures / Treatments   Labs (all labs ordered are listed, but only abnormal results are displayed) Labs Reviewed  URINALYSIS, ROUTINE W REFLEX MICROSCOPIC  COMPREHENSIVE METABOLIC PANEL  CBC WITH DIFFERENTIAL/PLATELET    EKG None  Radiology No results found.  Procedures Procedures    Medications Ordered in ED Medications  morphine (PF) 4 MG/ML injection 4 mg (has no administration in time range)  sodium chloride 0.9 % bolus 500 mL (has no administration in time range)    ED Course/ Medical Decision Making/ A&P This patient with a Hx of Foley catheter, MS, intra-abdominal scar tissue presents to the ED for concern of dysuria, acute onset suprapubic pain decreased urine output, this involves an extensive number of treatment options, and is a  complaint that carries with it a high risk of complications and morbidity.    The differential diagnosis includes obstruction, infection, Foley catheter malfunction, bacteremia, sepsis   Social Determinants of Health:  Age, multiple sclerosis  Additional history obtained:  Additional history and/or information obtained from husband at bedside, notable for HPI   After the initial evaluation, orders, including: Bladder scan, analgesics, labs, urinalysis were initiated.   Patient placed on Cardiac and Pulse-Oximetry Monitors. The patient was maintained on a cardiac monitor.  The cardiac monitored showed an rhythm of 95 sinus normal The patient was also maintained on pulse oximetry. The readings were typically 100% room air normal   On repeat evaluation of the patient improved  Following exchange of the Foley catheter the patient's symptoms have resolved.  She declines additional testing, requests discharge.  Imaging Studies ordered:  I independently visualized and interpreted imaging which showed bladder scan at bedside with 30-40 mL of urine  Dispostion / Final MDM:  After consideration of the diagnostic results and the patient's response to treatment, symptoms have resolved entirely she is afebrile, awake, alert, requests discharge prior to urinalysis, and this is reasonable, patient encouraged to follow-up with primary care and urology.  Final Clinical Impression(s) / ED Diagnoses Final diagnoses:  Obstruction of Foley catheter, initial encounter [facility])    Rx / [place] Orders ED Discharge Orders     None         Carmin Muskrat, MD 01/12/22 1254

## 2022-01-19 ENCOUNTER — Emergency Department (HOSPITAL_COMMUNITY)
Admission: EM | Admit: 2022-01-19 | Discharge: 2022-01-19 | Disposition: A | Discharge status: Home / Self Care | Payer: Medicare HMO | Attending: Emergency Medicine | Admitting: Emergency Medicine

## 2022-01-19 ENCOUNTER — Encounter (HOSPITAL_COMMUNITY): Payer: Self-pay

## 2022-01-19 DIAGNOSIS — D72829 Elevated white blood cell count, unspecified: Secondary | ICD-10-CM | POA: Insufficient documentation

## 2022-01-19 DIAGNOSIS — T83031A Leakage of indwelling urethral catheter, initial encounter: Secondary | ICD-10-CM | POA: Diagnosis present

## 2022-01-19 DIAGNOSIS — Z7982 Long term (current) use of aspirin: Secondary | ICD-10-CM | POA: Insufficient documentation

## 2022-01-19 DIAGNOSIS — N390 Urinary tract infection, site not specified: Secondary | ICD-10-CM | POA: Diagnosis not present

## 2022-01-19 DIAGNOSIS — T839XXA Unspecified complication of genitourinary prosthetic device, implant and graft, initial encounter: Secondary | ICD-10-CM

## 2022-01-19 LAB — URINALYSIS, ROUTINE W REFLEX MICROSCOPIC
Bilirubin Urine: NEGATIVE
Glucose, UA: NEGATIVE mg/dL
Ketones, ur: NEGATIVE mg/dL
Nitrite: POSITIVE — AB
Protein, ur: 100 mg/dL — AB
RBC / HPF: >50 RBC/hpf — ABNORMAL HIGH (ref 0–5)
Specific Gravity, Urine: 1.005 (ref 1.005–1.030)
WBC, UA: >50 WBC/hpf — ABNORMAL HIGH (ref 0–5)
pH: 6 (ref 5.0–8.0)

## 2022-01-19 LAB — COMPREHENSIVE METABOLIC PANEL
ALT: 33 U/L (ref 0–44)
AST: 35 U/L (ref 15–41)
Albumin: 3.9 g/dL (ref 3.5–5.0)
Alkaline Phosphatase: 75 U/L (ref 38–126)
Anion gap: 8 (ref 5–15)
BUN: 16 mg/dL (ref 8–23)
CO2: 24 mmol/L (ref 22–32)
Calcium: 9.8 mg/dL (ref 8.9–10.3)
Chloride: 108 mmol/L (ref 98–111)
Creatinine, Ser: 0.65 mg/dL (ref 0.44–1.00)
GFR, Estimated: >60 mL/min (ref >60)
Glucose, Bld: 102 mg/dL — ABNORMAL HIGH (ref 70–99)
Potassium: 3.6 mmol/L (ref 3.5–5.1)
Sodium: 140 mmol/L (ref 135–145)
Total Bilirubin: 0.5 mg/dL (ref 0.3–1.2)
Total Protein: 7.3 g/dL (ref 6.5–8.1)

## 2022-01-19 LAB — CBC WITH DIFFERENTIAL/PLATELET
Abs Immature Granulocytes: 0.05 10*3/uL (ref 0.00–0.07)
Basophils Absolute: 0 10*3/uL (ref 0.0–0.1)
Basophils Relative: 0 %
Eosinophils Absolute: 0.1 10*3/uL (ref 0.0–0.5)
Eosinophils Relative: 1 %
HCT: 39.1 % (ref 36.0–46.0)
Hemoglobin: 13 g/dL (ref 12.0–15.0)
Immature Granulocytes: 0 %
Lymphocytes Relative: 21 %
Lymphs Abs: 2.9 10*3/uL (ref 0.7–4.0)
MCH: 32.6 pg (ref 26.0–34.0)
MCHC: 33.2 g/dL (ref 30.0–36.0)
MCV: 98 fL (ref 80.0–100.0)
Monocytes Absolute: 0.9 10*3/uL (ref 0.1–1.0)
Monocytes Relative: 6 %
Neutro Abs: 10.2 10*3/uL — ABNORMAL HIGH (ref 1.7–7.7)
Neutrophils Relative %: 72 %
Platelets: 325 10*3/uL (ref 150–400)
RBC: 3.99 MIL/uL (ref 3.87–5.11)
RDW: 11.9 % (ref 11.5–15.5)
WBC: 14.2 10*3/uL — ABNORMAL HIGH (ref 4.0–10.5)
nRBC: 0 % (ref 0.0–0.2)

## 2022-01-19 MED ORDER — MORPHINE SULFATE (PF) 4 MG/ML IV SOLN
4.0000 mg | Freq: Once | INTRAVENOUS | Status: AC
  Administered 2022-01-19: 4 mg via INTRAVENOUS
  Filled 2022-01-19: qty 1

## 2022-01-19 MED ORDER — CIPROFLOXACIN HCL 250 MG PO TABS
500.0000 mg | ORAL_TABLET | Freq: Once | ORAL | Status: AC
  Administered 2022-01-19: 500 mg via ORAL
  Filled 2022-01-19: qty 2

## 2022-01-19 MED ORDER — CIPROFLOXACIN HCL 500 MG PO TABS: 500.0000 mg | ORAL_TABLET | Freq: Two times a day (BID) | ORAL | 0 refills | Status: DC

## 2022-01-19 NOTE — ED Notes (Signed)
Pt c/o urethral pain/ burning. Upon assessment, patient had leg bag in place with tea colored urine without about 500 in bag. This RN replaced bag with standard drainage bag to monitor output. Pt is having episodes of bladder spasms resulting is urine around catheter. Brief was saturated with foul smelling urine. Bryson Corona [name]

## 2022-01-19 NOTE — Discharge Instructions (Addendum)
Thank you for allowing me to be part of your care today.  Your Foley catheter was changed out while you are here in the ED.  If you continue to experience problems with leaking of urine, irritation, or poor output into the bag, please return to the ED for further evaluation.  Your urine shows evidence of a UTI.  I have sent over an antibiotic to your pharmacy to treat this.  I recommend following up with your urologist if your symptoms do not begin to improve with treatment.  Please be sure to increase your water intake and monitor your urine output and color.

## 2022-01-19 NOTE — ED Notes (Signed)
Pt alert ,oriented and wheeled to the parking lot with her husband driving. Leg bag placed by NT Lisa. Pt aware to follow up with urology. Bryson Corona [name]

## 2022-01-19 NOTE — ED Triage Notes (Signed)
Pt states that she has a foley in place. Pt states that she is leaking around her catheter around 1400 today. Pt is also having a lot of pain around that area. Pt states that she is still having output in the bag as well.

## 2022-01-19 NOTE — ED Notes (Signed)
Bladder scan=  77m. Jennifer CoronaSEdd Fabian

## 2022-01-19 NOTE — ED Provider Notes (Signed)
[facility] EMERGENCY DEPARTMENT Provider Note   CSN: [id number] Arrival date & time: 01/19/22  1816     History  Chief Complaint  Patient presents with   Foley Catheter Issues    [name] is a 66 y.o. female presents to the ED with Foley catheter problem.  Patient has a Foley in place secondary to multiple sclerosis and recently had it changed on 01/12/2022 in ED for obstruction.  Patient states she is now leaking urine around her catheter that began today around 2 PM.  She is also having a significant amount of pain in the area and in the suprapubic region.  She reports there is still some urine output in the bag and is red-colored.  Denies fever, chills, nausea, vomiting, diarrhea, flank pain.       Home Medications Prior to Admission medications   Medication Sig Start Date End Date Taking? Authorizing Provider  ciprofloxacin (CIPRO) 500 MG tablet Take 1 tablet (500 mg total) by mouth every 12 (twelve) hours. 01/19/22  Yes Ranferi Clingan R, PA  Aspirin-Acetaminophen-Caffeine (EXCEDRIN PO) Take by mouth.    [provider]  Calcium Carbonate-Vitamin D (CALCIUM 600 + D PO) Take 1 tablet by mouth daily.    [provider]  cephALEXin (KEFLEX) 250 MG capsule Take 250 mg by mouth daily.    [provider]  Cholecalciferol 50 MCG (2000 UT) TABS D3 DOTS 50 mcg (2,000 unit) tablet    [provider]  docusate sodium 100 MG CAPS Take 100 mg by mouth daily. Do not take if your having abdominal pain or nausea Patient not taking: Reported on 02/09/2021 08/06/13   Thurnell Lose, MD  ferrous sulfate 325 (65 FE) MG tablet Take 325 mg by mouth daily with breakfast.    [provider]  Misc Natural Products (OSTEO BI-FLEX ADV JOINT SHIELD PO) Take 1 tablet by mouth daily.    [provider]  Multiple Vitamin (MULTIVITAMIN) tablet Take 1 tablet by mouth daily.    [provider]  oxybutynin (DITROPAN) 5 MG tablet Take 5 mg by  mouth 3 (three) times daily.  06/02/12   [provider]  Potassium Chloride ER 20 MEQ TBCR Take 20 mEq by mouth daily for 5 days. 1 tab daily by mouth 02/12/21 02/17/21  Roxan Hockey, MD      Allergies    Patient has no known allergies.    Review of Systems   Review of Systems  Constitutional:  Negative for chills and fever.  Gastrointestinal:  Positive for abdominal pain. Negative for diarrhea, nausea and vomiting.  Genitourinary:  Positive for dysuria and hematuria. Negative for flank pain.    Physical Exam Updated Vital Signs BP 139/81   Pulse (!) 106   Temp 98.2 F (36.8 C) (Oral)   Resp 15   Ht '4\' 11"'$  (1.499 m)   Wt 50.8 kg   SpO2 99%   BMI 22.62 kg/m  Physical Exam Vitals and nursing note reviewed. Exam conducted with a chaperone present.  Constitutional:      General: She is not in acute distress.    Appearance: She is ill-appearing.  HENT:     Mouth/Throat:     Mouth: Mucous membranes are moist.     Pharynx: Oropharynx is clear.  Cardiovascular:     Rate and Rhythm: Normal rate and regular rhythm.     Pulses: Normal pulses.     Heart sounds: Normal heart sounds.  Pulmonary:  Effort: Pulmonary effort is normal. No respiratory distress.     Breath sounds: Normal breath sounds and air entry.  Abdominal:     General: Abdomen is flat. Bowel sounds are normal. There is no distension.     Palpations: Abdomen is soft.     Tenderness: There is abdominal tenderness in the suprapubic area.  Genitourinary:    Comments: Foley catheter in place with no obvious leaking urine at time of exam.  Foley catheter does not appear to be dislodged or misplaced.  There is erythema and irritation around the urethra. Skin:    General: Skin is warm and dry.     Capillary Refill: Capillary refill takes less than 2 seconds.     Findings: No rash.  Neurological:     Mental Status: She is alert. Mental status is at baseline.  Psychiatric:        Mood and Affect: Mood  normal.        Behavior: Behavior normal.     ED Results / Procedures / Treatments   Labs (all labs ordered are listed, but only abnormal results are displayed) Labs Reviewed  URINALYSIS, ROUTINE W REFLEX MICROSCOPIC - Abnormal; Notable for the following components:      Result Value   APPearance HAZY (*)    Hgb urine dipstick LARGE (*)    Protein, ur 100 (*)    Nitrite POSITIVE (*)    Leukocytes,Ua MODERATE (*)    RBC / HPF >50 (*)    WBC, UA >50 (*)    Bacteria, UA MANY (*)    All other components within normal limits  CBC WITH DIFFERENTIAL/PLATELET - Abnormal; Notable for the following components:   WBC 14.2 (*)    Neutro Abs 10.2 (*)    All other components within normal limits  COMPREHENSIVE METABOLIC PANEL - Abnormal; Notable for the following components:   Glucose, Bld 102 (*)    All other components within normal limits    EKG None  Radiology No results found.  Procedures Procedures    Medications Ordered in ED Medications  morphine (PF) 4 MG/ML injection 4 mg (4 mg Intravenous Given 01/19/22 2126)    ED Course/ Medical Decision Making/ A&P                           Medical Decision Making Amount and/or Complexity of Data Reviewed Labs: ordered.  Risk Prescription drug management.   This patient presents to the ED with chief complaint(s) of Foley catheter problem with pertinent past medical history of MS requiring Foley placement, recent obstruction of Foley catheter requiring replacement in ED.The complaint involves an extensive differential diagnosis and also carries with it a high risk of complications and morbidity.    The differential diagnosis includes Foley catheter obstruction, dislodged Foley catheter, UTI, hematuria, pyelonephritis  The initial plan is to obtain baseline labs and UA  Additional history obtained: Additional history obtained from spouse at bedside  Initial Assessment:   On exam, patient is ill-appearing and appears to  be in pain.  She is nontoxic, nondiaphoretic.  Abdomen is soft and tender in the suprapubic region.  Foley catheter is in place with erythema and urethral irritation, no actively leaking urine, no rashes to genital area.  Patient does have pink-colored urine in catheter bag.  No sediment.  Independent ECG/labs interpretation:  The following labs were independently interpreted:  CBC significant for leukocytosis with an elevated white count of 14.2.  No evidence of anemia. Metabolic panel demonstrates no major electrolyte disturbance.  FTs are within normal range.  Normal kidney function. UA significant for nitrite positive, moderate leukocytes, many bacteria, large amount of hemoglobin consistent with acute cystitis with hematuria.  Independent visualization and interpretation of imaging: I independently visualized the following imaging with scope of interpretation limited to determining acute life threatening conditions related to emergency care: Not indicated  Treatment and Reassessment: Will treat patient's pain while in ED and replace Foley catheter due to urine leaking.  Plan to also give patient first dose of antibiotic for acute cystitis while in ED. Will send patient home on antibiotics.  Upon reassessment, patient states she is feeling much better.  Foley catheter was changed without incidence.  She does have hematuria still present, but states that her pain is much improved.  Disposition:   The patient has been appropriately medically screened and/or stabilized in the ED. I have low suspicion for any other emergent medical condition which would require further screening, evaluation or treatment in the ED or require inpatient management. At time of discharge the patient is hemodynamically stable and in no acute distress. I have discussed work-up results and diagnosis with patient and answered all questions. Patient is agreeable with discharge plan. We discussed strict return precautions for  returning to the emergency department and they verbalized understanding.  Antibiotic to treat complicated UTI sent to patient's pharmacy.  Advised patient to monitor urinary output and return to ED if she experiences obstruction or other Foley catheter problem.  Recommended patient follow-up with primary care provider or urology if her symptoms do not begin to improve with antibiotic.           Final Clinical Impression(s) / ED Diagnoses Final diagnoses:  Problem with Foley catheter, initial encounter ([place])  Complicated UTI (urinary tract infection)    Rx / [place] Orders ED Discharge Orders          Ordered    ciprofloxacin (CIPRO) 500 MG tablet  Every 12 hours        01/19/22 2309              Theressa Stamps R, PA 03/50/09 3818    Lianne Cure, DO 29/93/71 0126

## 2022-02-08 ENCOUNTER — Other Ambulatory Visit: Payer: Self-pay | Admitting: Urology

## 2022-02-12 NOTE — Patient Instructions (Addendum)
SURGICAL WAITING ROOM VISITATION Patients having surgery or a procedure may have no more than 2 support people in the waiting area - these visitors may rotate.    If the patient needs to stay at the hospital during part of their recovery, the visitor guidelines for inpatient rooms apply. Pre-op nurse will coordinate an appropriate time for 1 support person to accompany patient in pre-op.  This support person may not rotate.    Please refer to the [facility] website for the visitor guidelines for Inpatients (after your surgery is over and you are in a regular room).   Due to an increase in RSV and influenza rates and associated hospitalizations, children ages 90 and under may not visit patients in [place].     Your procedure is scheduled on: 02-20-22   Report to [facility] Main Entrance    Report to admitting at 10:00 AM   Call this number if you have problems the morning of surgery [phone]   Do not eat food or drink liquids:After Midnight.          If you have questions, please contact your surgeon's office.   FOLLOW  ANY ADDITIONAL PRE OP INSTRUCTIONS YOU RECEIVED FROM YOUR SURGEON'S OFFICE!!!     Oral Hygiene is also important to reduce your risk of infection.                                    Remember - BRUSH YOUR TEETH THE MORNING OF SURGERY WITH YOUR REGULAR TOOTHPASTE   Do NOT smoke after Midnight   Take these medicines the morning of surgery with A SIP OF WATER:    Oxybutynin                              You may not have any metal on your body including hair pins, jewelry, and body piercing             Do not wear make-up, lotions, powders, perfumes or deodorant  Do not wear nail polish including gel and S&S, artificial/acrylic nails, or any other type of covering on natural nails including finger and toenails. If you have artificial nails, gel coating, etc. that needs to be removed by a nail salon please have this removed prior to surgery  or surgery may need to be canceled/ delayed if the surgeon/ anesthesia feels like they are unable to be safely monitored.   Do not shave  48 hours prior to surgery.    Do not bring valuables to the hospital. [place].   Contacts, dentures or bridgework may not be worn into surgery.   DO NOT [place]. PHARMACY WILL DISPENSE MEDICATIONS LISTED ON YOUR MEDICATION LIST TO YOU DURING YOUR ADMISSION [place]!    Patients discharged on the day of surgery will not be allowed to drive home.  Someone NEEDS to stay with you for the first 24 hours after anesthesia.   Special Instructions: Bring a copy of your healthcare power of attorney and living will documents the day of surgery if you haven't scanned them before.              Please read over the following fact sheets you were given: IF YOU HAVE [facility] [phone]  Maudry Mayhew  If you received a COVID test during your pre-op visit  it is requested that you wear a mask when out in public, stay away from anyone that may not be feeling well and notify your surgeon if you develop symptoms. If you test positive for Covid or have been in contact with anyone that has tested positive in the last 10 days please notify you surgeon.  [place] - Preparing for Surgery Before surgery, you can play an important role.  Because skin is not sterile, your skin needs to be as free of germs as possible.  You can reduce the number of germs on your skin by washing with CHG (chlorahexidine gluconate) soap before surgery.  CHG is an antiseptic cleaner which kills germs and bonds with the skin to continue killing germs even after washing. Please DO NOT use if you have an allergy to CHG or antibacterial soaps.  If your skin becomes reddened/irritated stop using the CHG and inform your nurse when you arrive at Short Stay. Do not shave (including legs and  underarms) for at least 48 hours prior to the first CHG shower.  You may shave your face/neck.  Please follow these instructions carefully:  1.  Shower with CHG Soap the night before surgery and the  morning of surgery.  2.  If you choose to wash your hair, wash your hair first as usual with your normal  shampoo.  3.  After you shampoo, rinse your hair and body thoroughly to remove the shampoo.                             4.  Use CHG as you would any other liquid soap.  You can apply chg directly to the skin and wash.  Gently with a scrungie or clean washcloth.  5.  Apply the CHG Soap to your body ONLY FROM THE NECK DOWN.   Do   not use on face/ open                           Wound or open sores. Avoid contact with eyes, ears mouth and   genitals (private parts).                       Wash face,  Genitals (private parts) with your normal soap.             6.  Wash thoroughly, paying special attention to the area where your    surgery  will be performed.  7.  Thoroughly rinse your body with warm water from the neck down.  8.  DO NOT shower/wash with your normal soap after using and rinsing off the CHG Soap.                9.  Pat yourself dry with a clean towel.            10.  Wear clean pajamas.            11.  Place clean sheets on your bed the night of your first shower and do not  sleep with pets. Day of Surgery : Do not apply any lotions/deodorants the morning of surgery.  Please wear clean clothes to the hospital/surgery center.  FAILURE TO FOLLOW THESE INSTRUCTIONS MAY RESULT IN THE CANCELLATION OF YOUR SURGERY  PATIENT SIGNATURE_________________________________  NURSE SIGNATURE__________________________________  ________________________________________________________________________

## 2022-02-13 ENCOUNTER — Other Ambulatory Visit: Payer: Self-pay

## 2022-02-13 ENCOUNTER — Encounter (HOSPITAL_COMMUNITY): Payer: Self-pay

## 2022-02-13 ENCOUNTER — Encounter (HOSPITAL_COMMUNITY)
Admission: RE | Admit: 2022-02-13 | Discharge: 2022-02-13 | Disposition: A | Discharge status: Home / Self Care | Payer: Medicare HMO | Source: Ambulatory Visit | Attending: Urology | Admitting: Urology

## 2022-02-13 VITALS — BP 157/84 | HR 81 | Temp 97.8°F | Ht 62.0 in | Wt 111.0 lb

## 2022-02-13 DIAGNOSIS — Z01818 Encounter for other preprocedural examination: Secondary | ICD-10-CM | POA: Diagnosis present

## 2022-02-13 DIAGNOSIS — R9431 Abnormal electrocardiogram [ECG] [EKG]: Secondary | ICD-10-CM | POA: Diagnosis not present

## 2022-02-13 DIAGNOSIS — I2510 Atherosclerotic heart disease of native coronary artery without angina pectoris: Secondary | ICD-10-CM | POA: Diagnosis not present

## 2022-02-13 LAB — BASIC METABOLIC PANEL
Anion gap: 7 (ref 5–15)
BUN: 23 mg/dL (ref 8–23)
CO2: 26 mmol/L (ref 22–32)
Calcium: 9.3 mg/dL (ref 8.9–10.3)
Chloride: 104 mmol/L (ref 98–111)
Creatinine, Ser: 0.9 mg/dL (ref 0.44–1.00)
GFR, Estimated: >60 mL/min (ref >60)
Glucose, Bld: 87 mg/dL (ref 70–99)
Potassium: 4.6 mmol/L (ref 3.5–5.1)
Sodium: 137 mmol/L (ref 135–145)

## 2022-02-13 LAB — CBC
HCT: 38.9 % (ref 36.0–46.0)
Hemoglobin: 12.6 g/dL (ref 12.0–15.0)
MCH: 32.5 pg (ref 26.0–34.0)
MCHC: 32.4 g/dL (ref 30.0–36.0)
MCV: 100.3 fL — ABNORMAL HIGH (ref 80.0–100.0)
Platelets: 258 10*3/uL (ref 150–400)
RBC: 3.88 MIL/uL (ref 3.87–5.11)
RDW: 12.5 % (ref 11.5–15.5)
WBC: 7.4 10*3/uL (ref 4.0–10.5)
nRBC: 0 % (ref 0.0–0.2)

## 2022-02-13 NOTE — Progress Notes (Signed)
For Short Stay: [place] appointment date:  Bowel Prep reminder:   For Anesthesia: PCP - Bing Matter : PA-C Cardiologist - N/A Neurologist: Dr. Ala Bent Chest x-ray -  EKG -  Stress Test -  ECHO -  Cardiac Cath -  Pacemaker/ICD device last checked: Pacemaker orders received: Device Rep notified:  Spinal Cord Stimulator:  Sleep Study -  CPAP -   Fasting Blood Sugar -  Checks Blood Sugar _____ times a day Date and result of last Hgb A1c-  Last dose of GLP1 agonist-  GLP1 instructions:   Last dose of SGLT-2 inhibitors-  SGLT-2 instructions:   Blood Thinner Instructions: Aspirin Instructions: Last Dose:  Activity level: Can go up a flight of stairs and activities of daily living without stopping and without chest pain and/or shortness of breath   Able to exercise without chest pain and/or shortness of breath  Anesthesia review: Hx: HTN,MS  Patient denies shortness of breath, fever, cough and chest pain at PAT appointment   Patient verbalized understanding of instructions that were given to them at the PAT appointment. Patient was also instructed that they will need to review over the PAT instructions again at home before surgery.

## 2022-02-20 ENCOUNTER — Ambulatory Visit (HOSPITAL_COMMUNITY): Payer: Medicare HMO | Admitting: Physician Assistant

## 2022-02-20 ENCOUNTER — Other Ambulatory Visit: Payer: Self-pay

## 2022-02-20 ENCOUNTER — Encounter (HOSPITAL_COMMUNITY)
Admission: RE | Disposition: A | Discharge status: Home / Self Care | Payer: Self-pay | Source: Home / Self Care | Attending: Urology

## 2022-02-20 ENCOUNTER — Ambulatory Visit (HOSPITAL_COMMUNITY)
Admission: RE | Admit: 2022-02-20 | Discharge: 2022-02-20 | Disposition: A | Discharge status: Home / Self Care | Payer: Medicare HMO | Attending: Urology | Admitting: Urology

## 2022-02-20 ENCOUNTER — Ambulatory Visit (HOSPITAL_BASED_OUTPATIENT_CLINIC_OR_DEPARTMENT_OTHER): Payer: Medicare HMO | Admitting: Anesthesiology

## 2022-02-20 ENCOUNTER — Ambulatory Visit (HOSPITAL_COMMUNITY): Payer: Medicare HMO

## 2022-02-20 ENCOUNTER — Encounter (HOSPITAL_COMMUNITY): Payer: Self-pay | Admitting: Urology

## 2022-02-20 DIAGNOSIS — I2510 Atherosclerotic heart disease of native coronary artery without angina pectoris: Secondary | ICD-10-CM

## 2022-02-20 DIAGNOSIS — N319 Neuromuscular dysfunction of bladder, unspecified: Secondary | ICD-10-CM | POA: Insufficient documentation

## 2022-02-20 DIAGNOSIS — N210 Calculus in bladder: Secondary | ICD-10-CM | POA: Diagnosis not present

## 2022-02-20 DIAGNOSIS — I10 Essential (primary) hypertension: Secondary | ICD-10-CM | POA: Insufficient documentation

## 2022-02-20 DIAGNOSIS — R8271 Bacteriuria: Secondary | ICD-10-CM | POA: Diagnosis not present

## 2022-02-20 DIAGNOSIS — Z9049 Acquired absence of other specified parts of digestive tract: Secondary | ICD-10-CM | POA: Diagnosis not present

## 2022-02-20 DIAGNOSIS — G35 Multiple sclerosis: Secondary | ICD-10-CM | POA: Diagnosis not present

## 2022-02-20 DIAGNOSIS — M419 Scoliosis, unspecified: Secondary | ICD-10-CM | POA: Insufficient documentation

## 2022-02-20 DIAGNOSIS — R310 Gross hematuria: Secondary | ICD-10-CM | POA: Insufficient documentation

## 2022-02-20 HISTORY — PX: CYSTOSCOPY WITH LITHOLAPAXY: SHX1425

## 2022-02-20 HISTORY — PX: CYSTOSCOPY W/ RETROGRADES: SHX1426

## 2022-02-20 HISTORY — PX: CYSTOSCOPY WITH BIOPSY: SHX5122

## 2022-02-20 SURGERY — CYSTOSCOPY, WITH RETROGRADE PYELOGRAM
Anesthesia: General | Site: Ureter

## 2022-02-20 MED ORDER — FENTANYL CITRATE PF 50 MCG/ML IJ SOSY
PREFILLED_SYRINGE | INTRAMUSCULAR | Status: AC
  Filled 2022-02-20: qty 1

## 2022-02-20 MED ORDER — OXYCODONE HCL 5 MG PO TABS
ORAL_TABLET | ORAL | Status: AC
  Filled 2022-02-20: qty 1

## 2022-02-20 MED ORDER — PROPOFOL 10 MG/ML IV BOLUS
INTRAVENOUS | Status: AC
  Filled 2022-02-20: qty 20

## 2022-02-20 MED ORDER — OXYCODONE HCL 5 MG/5ML PO SOLN: 5.0000 mg | Freq: Once | ORAL | Status: AC | PRN

## 2022-02-20 MED ORDER — FENTANYL CITRATE (PF) 100 MCG/2ML IJ SOLN
INTRAMUSCULAR | Status: DC | PRN
  Administered 2022-02-20: 25 ug via INTRAVENOUS

## 2022-02-20 MED ORDER — ONDANSETRON HCL 4 MG/2ML IJ SOLN
INTRAMUSCULAR | Status: AC
  Filled 2022-02-20: qty 2

## 2022-02-20 MED ORDER — OXYCODONE HCL 5 MG PO TABS
5.0000 mg | ORAL_TABLET | Freq: Once | ORAL | Status: AC | PRN
  Administered 2022-02-20: 5 mg via ORAL

## 2022-02-20 MED ORDER — FENTANYL CITRATE PF 50 MCG/ML IJ SOSY
25.0000 ug | PREFILLED_SYRINGE | INTRAMUSCULAR | Status: DC | PRN
  Administered 2022-02-20: 50 ug via INTRAVENOUS
  Administered 2022-02-20 (×4): 25 ug via INTRAVENOUS

## 2022-02-20 MED ORDER — PROPOFOL 10 MG/ML IV BOLUS
INTRAVENOUS | Status: DC | PRN
  Administered 2022-02-20: 140 mg via INTRAVENOUS

## 2022-02-20 MED ORDER — DEXAMETHASONE SODIUM PHOSPHATE 10 MG/ML IJ SOLN
INTRAMUSCULAR | Status: AC
  Filled 2022-02-20: qty 1

## 2022-02-20 MED ORDER — FENTANYL CITRATE (PF) 100 MCG/2ML IJ SOLN
INTRAMUSCULAR | Status: AC
  Filled 2022-02-20: qty 2

## 2022-02-20 MED ORDER — ONDANSETRON HCL 4 MG/2ML IJ SOLN: 4.0000 mg | Freq: Once | INTRAMUSCULAR | Status: DC | PRN

## 2022-02-20 MED ORDER — ORAL CARE MOUTH RINSE: 15.0000 mL | Freq: Once | OROMUCOSAL | Status: AC

## 2022-02-20 MED ORDER — LIDOCAINE HCL (PF) 2 % IJ SOLN
INTRAMUSCULAR | Status: AC
  Filled 2022-02-20: qty 5

## 2022-02-20 MED ORDER — DEXAMETHASONE SODIUM PHOSPHATE 10 MG/ML IJ SOLN
INTRAMUSCULAR | Status: DC | PRN
  Administered 2022-02-20: 4 mg via INTRAVENOUS

## 2022-02-20 MED ORDER — MIDAZOLAM HCL 2 MG/2ML IJ SOLN
INTRAMUSCULAR | Status: DC | PRN
  Administered 2022-02-20: 1 mg via INTRAVENOUS

## 2022-02-20 MED ORDER — STERILE WATER FOR IRRIGATION IR SOLN
Status: DC | PRN
  Administered 2022-02-20 (×2): 3000 mL

## 2022-02-20 MED ORDER — PHENYLEPHRINE 80 MCG/ML (10ML) SYRINGE FOR IV PUSH (FOR BLOOD PRESSURE SUPPORT)
PREFILLED_SYRINGE | INTRAVENOUS | Status: DC | PRN
  Administered 2022-02-20 (×2): 80 ug via INTRAVENOUS
  Administered 2022-02-20 (×2): 40 ug via INTRAVENOUS

## 2022-02-20 MED ORDER — METHOCARBAMOL 500 MG IVPB - SIMPLE MED
500.0000 mg | Freq: Once | INTRAVENOUS | Status: AC
  Administered 2022-02-20: 500 mg via INTRAVENOUS
  Filled 2022-02-20: qty 55

## 2022-02-20 MED ORDER — LACTATED RINGERS IV SOLN: INTRAVENOUS | Status: DC

## 2022-02-20 MED ORDER — IOHEXOL 300 MG/ML  SOLN
INTRAMUSCULAR | Status: DC | PRN
  Administered 2022-02-20: 50 mL

## 2022-02-20 MED ORDER — ONDANSETRON HCL 4 MG/2ML IJ SOLN
INTRAMUSCULAR | Status: DC | PRN
  Administered 2022-02-20: 4 mg via INTRAVENOUS

## 2022-02-20 MED ORDER — GENTAMICIN SULFATE 40 MG/ML IJ SOLN
5.0000 mg/kg | INTRAVENOUS | Status: AC
  Administered 2022-02-20: 250 mg via INTRAVENOUS
  Filled 2022-02-20: qty 6.25

## 2022-02-20 MED ORDER — MIDAZOLAM HCL 2 MG/2ML IJ SOLN
INTRAMUSCULAR | Status: AC
  Filled 2022-02-20: qty 2

## 2022-02-20 MED ORDER — TRAMADOL HCL 50 MG PO TABS: 50.0000 mg | ORAL_TABLET | Freq: Four times a day (QID) | ORAL | 0 refills | Status: AC | PRN

## 2022-02-20 MED ORDER — CHLORHEXIDINE GLUCONATE 0.12 % MT SOLN
15.0000 mL | Freq: Once | OROMUCOSAL | Status: AC
  Administered 2022-02-20: 15 mL via OROMUCOSAL

## 2022-02-20 MED ORDER — LIDOCAINE 2% (20 MG/ML) 5 ML SYRINGE
INTRAMUSCULAR | Status: DC | PRN
  Administered 2022-02-20: 100 mg via INTRAVENOUS

## 2022-02-20 MED ORDER — METHOCARBAMOL 500 MG IVPB - SIMPLE MED
INTRAVENOUS | Status: AC
  Filled 2022-02-20: qty 55

## 2022-02-20 SURGICAL SUPPLY — 29 items
BAG COUNTER SPONGE SURGICOUNT (BAG) IMPLANT
BAG URINE DRAIN 2000ML AR STRL (UROLOGICAL SUPPLIES) IMPLANT
BAG URO CATCHER STRL LF (MISCELLANEOUS) ×3 IMPLANT
CATH URETL OPEN END 6FR 70 (CATHETERS) IMPLANT
CLOTH BEACON ORANGE TIMEOUT ST (SAFETY) ×3 IMPLANT
DRAPE FOOT SWITCH (DRAPES) ×3 IMPLANT
ELECT REM PT RETURN 15FT ADLT (MISCELLANEOUS) ×3 IMPLANT
EVACUATOR MICROVAS BLADDER (UROLOGICAL SUPPLIES) IMPLANT
GLOVE SURG LX STRL 7.5 STRW (GLOVE) ×3 IMPLANT
GOWN SRG XL LVL 4 BRTHBL STRL (GOWNS) ×3 IMPLANT
GOWN STRL NON-REIN XL LVL4 (GOWNS) ×3
GOWN STRL REUS W/ TWL XL LVL3 (GOWN DISPOSABLE) ×3 IMPLANT
GOWN STRL REUS W/TWL XL LVL3 (GOWN DISPOSABLE) ×3
GUIDEWIRE STR DUAL SENSOR (WIRE) ×3 IMPLANT
KIT TURNOVER KIT A (KITS) IMPLANT
LASER FIB FLEXIVA PULSE ID 365 (Laser) IMPLANT
LASER FIB FLEXIVA PULSE ID 550 (Laser) ×3 IMPLANT
LASER FIB FLEXIVA PULSE ID 910 (Laser) ×3 IMPLANT
LOOP CUT BIPOLAR 24F LRG (ELECTROSURGICAL) IMPLANT
MANIFOLD NEPTUNE II (INSTRUMENTS) ×3 IMPLANT
NS IRRIG 1000ML POUR BTL (IV SOLUTION) IMPLANT
PACK CYSTO (CUSTOM PROCEDURE TRAY) ×3 IMPLANT
SYR TOOMEY IRRIG 70ML (MISCELLANEOUS)
SYRINGE TOOMEY IRRIG 70ML (MISCELLANEOUS) IMPLANT
TRACTIP FLEXIVA PULS ID 200XHI (Laser) IMPLANT
TRACTIP FLEXIVA PULSE ID 200 (Laser)
TRAY FOLEY MTR SLVR 14FR STAT (SET/KITS/TRAYS/PACK) ×1 IMPLANT
TUBING CONNECTING 10 (TUBING) ×3 IMPLANT
TUBING UROLOGY SET (TUBING) ×3 IMPLANT

## 2022-02-20 NOTE — Anesthesia Procedure Notes (Signed)
Procedure Name: LMA Insertion Date/Time: 02/20/2022 11:20 AM  Performed by: Eben Burow, CRNAPre-anesthesia Checklist: Patient identified, Emergency Drugs available, Suction available, Patient being monitored and Timeout performed Patient Re-evaluated:Patient Re-evaluated prior to induction Oxygen Delivery Method: Circle system utilized Preoxygenation: Pre-oxygenation with 100% oxygen Induction Type: IV induction Ventilation: Mask ventilation without difficulty LMA: LMA inserted LMA Size: 3.0 Number of attempts: 1 Tube secured with: Tape Dental Injury: Teeth and Oropharynx as per pre-operative assessment

## 2022-02-20 NOTE — H&P (Signed)
[name] is an 67 y.o. female.    Chief Complaint: Pre-OP Cystolithalopexy, retrogrades, bladder biopsy  HPI:   1 - Neurogenic Bladder / [place] Hyperactivity with Impaired Contractility / Retention seondardy to [place] - over a decade of NGB managed by numerous providers in multiple locations ([place], [place], [place], [place], [place]). Prior UDS per report but none recent for review. Small capacity per report. Managed with QID self cath for years, but more recently indwelling catheter and B3 agonists. No help with PTNS. She states her QOL is best with indwelling catheter.   12/2021 - Cr 0.65, CT no hydro   2 - Bacteruria - chronic bacteruria as expected with chronic catheters. No hydro, upper tract stones by recent upper tract imaging. Most recetn postitive CX e. coli sens keflex, getn (Res cipro, bactrim).   3 - Bladder Stone, Hematuria - on /off gross hematuria with some occasional clots 2023. CT with abtou 2cm bladder stone. Cysto with choric inflammation per report.   PMH sig for MS (some mild L side weakness and neurogenic bladder most sequelae), scoliosis, benign hyst, small bowel resection (open midline scar). Retired from [employer]. No ischemic CV disease / blood thinners. Her husband Richardson Landry is very involved. Her PCP is Emmie Niemann PA.    Today "[name]" is seen to proceed with operative cysto, cystolithalopexy, bladder biopsy/fulgeration and measurement of cystometric capacity. No interval fevers. She has been on keflex pre-op as instructed.    Past Medical History:  Diagnosis Date   Abdominal fluid collection 05/07/2012   Abdominal pain, RUQ (right upper quadrant) 06/15/2012   Abdominal wall fluid collections 06/15/2012   Headache(784.0)    "several/wk" (05/17/2012)   History of blood transfusion ~ 2012   "after multiple UTI's; got bladder infection; passed alot of blood" (05/05/2012)   History of recurrent UTIs    Hypertension    Multiple sclerosis ([place])  01/28/1970   Neuromuscular disorder ([place])    MS   Osteoporosis    Scoliosis    Self-catheterizes urinary bladder ~ 2012   "sphincter in bladder doesn't work properly due to my MS" (05/17/2012   Shingles outbreak 08/29/2011   Small bowel obstruction ([place]) 06/17/2012    Past Surgical History:  Procedure Laterality Date   LAPAROTOMY N/A 06/17/2012   Procedure: EXPLORATORY LAPAROTOMY FOR  SMALL BOWEL RESECTION;  Surgeon: Harl Bowie, MD;  Location: [facility];  Service: General;  Laterality: N/A;   TONSILLECTOMY     VAGINAL HYSTERECTOMY  01/28/2009   WOUND DEBRIDEMENT Bilateral 06/13/2012   Procedure: DEBRIDEMENT ABDOMINAL WOUND;  Surgeon: Stark Klein, MD;  Location: [facility];  Service: General;  Laterality: Bilateral;    Family History  Problem Relation Age of Onset   Heart disease Mother    Congestive Heart Failure Mother    Heart disease Father    Stroke Father    Heart disease Brother    Heart attack Brother    Social History:  reports that she has never smoked. She has never used smokeless tobacco. She reports that she does not drink alcohol and does not use drugs.  Allergies: No Known Allergies  No medications prior to admission.    No results found for this or any previous visit (from the past 48 hour(s)). No results found.  Review of Systems  Constitutional:  Negative for chills and fatigue.  All other systems reviewed and are negative.   There were no vitals taken for this visit. Physical Exam Vitals reviewed.  HENT:  Head: Normocephalic.     Mouth/Throat:     Mouth: Mucous membranes are moist.  Cardiovascular:     Rate and Rhythm: Normal rate.  Abdominal:     General: Abdomen is flat.  Genitourinary:    Comments: Foley in place with non-foul urine Musculoskeletal:        General: Normal range of motion.     Cervical back: Normal range of motion.  Neurological:     Mental Status: She is alert.     Comments: Stabel stigmata of MS,   Psychiatric:         Mood and Affect: Mood normal.      Assessment/Plan  Proceed as planned with cysto, bladder biopsy, fulgeration, cytolithaloopexy with goal of stone free, rule out neoplastic etiologiy for hematuria, and determine cystometric capacity. Risks, benefits, alternatives, expected peri-op course discussed previously and reiterated today.   Alexis Frock, MD 02/20/2022, 6:55 AM

## 2022-02-20 NOTE — Anesthesia Postprocedure Evaluation (Signed)
Anesthesia Post Note  Patient: [name]  Procedure(s) Performed: CYSTOSCOPY WITH RETROGRADE PYELOGRAM (Bilateral: Ureter) CYSTOSCOPY WITH BIOPSY AND FULGERATION (Bladder) CYSTOSCOPY WITH LITHOLAPAXY (Bladder)     Patient location during evaluation: PACU Anesthesia Type: General Level of consciousness: awake and alert Pain management: pain level controlled Vital Signs Assessment: post-procedure vital signs reviewed and stable Respiratory status: spontaneous breathing, nonlabored ventilation, respiratory function stable and patient connected to nasal cannula oxygen Cardiovascular status: blood pressure returned to baseline and stable Postop Assessment: no apparent nausea or vomiting Anesthetic complications: no  No notable events documented.  Last Vitals:  Vitals:   02/20/22 1201 02/20/22 1209  BP: (!) 157/71   Pulse: 85 80  Resp: 16 16  Temp: (!) 36.2 C   SpO2: 100% 99%    Last Pain:  Vitals:   02/20/22 1201  TempSrc:   PainSc: 0-No pain                 Jadyn Brasher S

## 2022-02-20 NOTE — Op Note (Signed)
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  PROCEDURE PERFORMED:   1.  Cystoscopy with bilateral retrograde pyelograms interpretation. 2.  Cystolitholapaxy stone less than 2 cm. 3.  Bladder biopsy, fulguration.  FINDINGS:   1.  Very contracted urinary bladder, 75 mL cystometric capacity. 2.  Unremarkable bilateral retrograde pyelograms. 3.  Diffuse bladder erythema, most consistent with chronic catheter irritation. 4.  Small bladder stone approximately 1.5 cm, completely resolved.  INDICATIONS:  The patient is a very pleasant, but unfortunate 67 year old lady with history of MS whose primary symptoms are actually urinary.  She has a neurogenic bladder.  This also likely very small, contracted and low capacity.  She has had  worsening problems with gross hematuria and was found to have a bladder stone on workup of this and was counseled towards cystolitholapaxy as well as bladder biopsy to rule out neoplasia as her urothelium was quite abnormal.  Informed consent was  obtained and placed in medical record.  PROCEDURE IN DETAIL:  The patient being identified and verified, procedure being cystoscopy with cystolitholapaxy, bilateral retrogrades, bladder biopsy, fulguration confirmed.  Procedure timeout was performed.  Intravenous antibiotics were administered.   General LMA anesthesia was induced.  The patient was placed into a low lithotomy position.  Sterile field was created, prepped and draped in the patient's vagina, introitus, and proximal thighs using iodine.  Cystourethroscopy was performed using  21-French  rigid cystoscope with offset lens.  Inspection of the patient urinary bladder revealed a very contracted bladder with diffuse mucosal erythema throughout most consistent with chronic bladder irritation.  Ureteral orifices were wide open, likely  consistent with prior high pressure system. Left ureteral orifice was cannulated 6-French renal catheter, and a left retrograde pyelogram was obtained.  Left retrograde pyelogram demonstrate single left ureter, single system left kidney.  No filling defects or narrowing noted.  Similarly, right retrograde pyelogram was obtained.  Right retrograde pyelogram demonstrated single right ureter, single system right kidney.  No filling defects or narrowing noted.  Cystoscope was then exchanged for the Lithotrite mechanical stone crushing apparatus and under very careful direct vision  stone in question was fragmented within its jaws taking exquisite care to avoid any mucosal injury, which did not occur.  The stone was fragmented into approximately 6 smaller pieces, which were then irrigated and set aside to be given to patient.  This  completely resolved the bladder stone.  Next, a cold cup biopsy forceps were used to obtain representative seromuscular biopsies of the urinary bladder at areas of erythema and set aside for permanent pathology.  The patient was fulgurated with bugbee electrode, which resulted in very good hemostasis.  A new 14-French Foley catheter was placed per urethra to straight drain.  Procedure was terminated.  Notably, I also determine cystometric capacity at approximately 75 mL.  The patient was taken to  postanesthesia care in stable condition.  Plan for discharge home.   PUS D: 02/20/2022 11:53:58 am T: 02/20/2022 12:32:00 pm  JOB: [id number]/ [id number]

## 2022-02-20 NOTE — Anesthesia Preprocedure Evaluation (Addendum)
Anesthesia Evaluation  Patient identified by MRN, date of birth, ID band Patient awake    Reviewed: Allergy & Precautions, H&P , NPO status , Patient's Chart, lab work & pertinent test results  Airway Mallampati: II  TM Distance: >3 FB Neck ROM: Full    Dental no notable dental hx.    Pulmonary neg pulmonary ROS   Pulmonary exam normal breath sounds clear to auscultation       Cardiovascular hypertension, Normal cardiovascular exam Rhythm:Regular Rate:Normal     Neuro/Psych MS  Neuromuscular disease  negative psych ROS   GI/Hepatic negative GI ROS, Neg liver ROS,,,  Endo/Other  negative endocrine ROS    Renal/GU negative Renal ROS  negative genitourinary   Musculoskeletal negative musculoskeletal ROS (+)    Abdominal   Peds negative pediatric ROS (+)  Hematology negative hematology ROS (+)   Anesthesia Other Findings   Reproductive/Obstetrics negative OB ROS                             Anesthesia Physical Anesthesia Plan  ASA: 3  Anesthesia Plan: General   Post-op Pain Management: Minimal or no pain anticipated   Induction: Intravenous  PONV Risk Score and Plan: 3 and Ondansetron, Dexamethasone and Treatment may vary due to age or medical condition  Airway Management Planned: LMA  Additional Equipment:   Intra-op Plan:   Post-operative Plan: Extubation in OR  Informed Consent: I have reviewed the patients History and Physical, chart, labs and discussed the procedure including the risks, benefits and alternatives for the proposed anesthesia with the patient or authorized representative who has indicated his/her understanding and acceptance.     Dental advisory given  Plan Discussed with: CRNA and Surgeon  Anesthesia Plan Comments:        Anesthesia Quick Evaluation

## 2022-02-20 NOTE — Brief Op Note (Signed)
02/20/2022  11:49 AM  PATIENT:  [name]  67 y.o. female  PRE-OPERATIVE DIAGNOSIS:  BLADDER STONE, HEMATURIA  POST-OPERATIVE DIAGNOSIS:  bladder stone hematuria  PROCEDURE:  Procedure(s) with comments: CYSTOSCOPY WITH RETROGRADE PYELOGRAM (Bilateral) - 1 HR CYSTOSCOPY WITH BIOPSY AND FULGERATION (N/A) CYSTOSCOPY WITH LITHOLAPAXY (N/A)  SURGEON:  Surgeon(s) and Role:    * Alexis Frock, MD - Primary  PHYSICIAN ASSISTANT:   ASSISTANTS: none   ANESTHESIA:   general  EBL:  minmal   BLOOD ADMINISTERED:none  DRAINS:  63ffoley to gravity    LOCAL MEDICATIONS USED:  NONE  SPECIMEN:  Source of Specimen:  bladder erythema  DISPOSITION OF SPECIMEN:  PATHOLOGY  COUNTS:  YES  TOURNIQUET:  * No tourniquets in log *  DICTATION: .Other Dictation: Dictation Number [id number] PLAN OF CARE: Discharge to home after PACU  PATIENT DISPOSITION:  PACU - hemodynamically stable.   Delay start of Pharmacological VTE agent (>24hrs) due to surgical blood loss or risk of bleeding: yes

## 2022-02-20 NOTE — Transfer of Care (Signed)
Immediate Anesthesia Transfer of Care Note  Patient: ELIZABELLE FITE  Procedure(s) Performed: CYSTOSCOPY WITH RETROGRADE PYELOGRAM (Bilateral: Ureter) CYSTOSCOPY WITH BIOPSY AND FULGERATION (Bladder) CYSTOSCOPY WITH LITHOLAPAXY (Bladder)  Patient Location: PACU  Anesthesia Type:General  Level of Consciousness: awake, alert , oriented, and patient cooperative  Airway & Oxygen Therapy: Patient Spontanous Breathing and Patient connected to face mask oxygen  Post-op Assessment: Report given to RN, Post -op Vital signs reviewed and stable, and Patient moving all extremities  Post vital signs: Reviewed and stable  Last Vitals:  Vitals Value Taken Time  BP 157/71 02/20/22 1201  Temp    Pulse 85 02/20/22 1203  Resp 12 02/20/22 1203  SpO2 100 % 02/20/22 1203  Vitals shown include unvalidated device data.  Last Pain:  Vitals:   02/20/22 0905  TempSrc:   PainSc: 0-No pain         Complications: No notable events documented.

## 2022-02-20 NOTE — Discharge Instructions (Addendum)
1 - You may have urinary urgency (bladder spasms) and bloody urine on / off with catheter in place. This is normal. ° °2 - Call MD or go to ER for fever >102, severe pain / nausea / vomiting not relieved by medications, or acute change in medical status ° °

## 2022-02-21 ENCOUNTER — Encounter (HOSPITAL_COMMUNITY): Payer: Self-pay | Admitting: Urology

## 2022-02-21 LAB — SURGICAL PATHOLOGY

## 2022-12-23 ENCOUNTER — Other Ambulatory Visit: Payer: Self-pay

## 2022-12-23 ENCOUNTER — Encounter (HOSPITAL_BASED_OUTPATIENT_CLINIC_OR_DEPARTMENT_OTHER): Payer: Self-pay

## 2022-12-23 ENCOUNTER — Emergency Department (HOSPITAL_BASED_OUTPATIENT_CLINIC_OR_DEPARTMENT_OTHER)
Admission: EM | Admit: 2022-12-23 | Discharge: 2022-12-23 | Disposition: A | Discharge status: Home / Self Care | Payer: Medicare HMO | Attending: Emergency Medicine | Admitting: Emergency Medicine

## 2022-12-23 DIAGNOSIS — R339 Retention of urine, unspecified: Secondary | ICD-10-CM | POA: Insufficient documentation

## 2022-12-23 DIAGNOSIS — T83091A Other mechanical complication of indwelling urethral catheter, initial encounter: Secondary | ICD-10-CM | POA: Insufficient documentation

## 2022-12-23 DIAGNOSIS — Z7982 Long term (current) use of aspirin: Secondary | ICD-10-CM | POA: Insufficient documentation

## 2022-12-23 DIAGNOSIS — Y732 Prosthetic and other implants, materials and accessory gastroenterology and urology devices associated with adverse incidents: Secondary | ICD-10-CM | POA: Insufficient documentation

## 2022-12-23 DIAGNOSIS — R140 Abdominal distension (gaseous): Secondary | ICD-10-CM | POA: Insufficient documentation

## 2022-12-23 DIAGNOSIS — T839XXA Unspecified complication of genitourinary prosthetic device, implant and graft, initial encounter: Secondary | ICD-10-CM

## 2022-12-23 LAB — URINALYSIS, ROUTINE W REFLEX MICROSCOPIC
Bacteria, UA: NONE SEEN
RBC / HPF: >50 RBC/hpf (ref 0–5)
WBC, UA: >50 WBC/hpf (ref 0–5)

## 2022-12-23 NOTE — Discharge Instructions (Addendum)
Follow-up with your Foley catheter nurse tomorrow.

## 2022-12-23 NOTE — ED Notes (Signed)
Report given to the next RN.Marland KitchenMarland Kitchen

## 2022-12-23 NOTE — ED Triage Notes (Signed)
Pt reports that her indwelling catheter is clogged and has not been draining since this afternoon.

## 2022-12-23 NOTE — ED Notes (Signed)
Foley changed for new 70fr with large bag. Continues to leak around catheter. EDP at bedside for US guided insertion. Leaking stops. Linens changes and leg strap provided. Pt report improved pain/pressure.

## 2022-12-23 NOTE — ED Provider Notes (Signed)
[place] EMERGENCY DEPARTMENT AT [facility] Provider Note   CSN: [id number] Arrival date & time: 12/23/22  1939     History  Chief Complaint  Patient presents with   Urinary Retention    [name] is a 67 y.o. female.  This is a 67 year old female here today because her Foley catheter is no longer draining.  She has had indwelling Foley catheter for 2 years.  Catheter placed due to MS.        Home Medications Prior to Admission medications   Medication Sig Start Date End Date Taking? Authorizing Provider  Aspirin-Acetaminophen-Caffeine (EXCEDRIN PO) Take by mouth.    [provider]  Calcium Carbonate-Vitamin D (CALCIUM 600 + D PO) Take 1 tablet by mouth daily.    [provider]  Cholecalciferol 50 MCG (2000 UT) TABS D3 DOTS 50 mcg (2,000 unit) tablet    [provider]  ferrous sulfate 325 (65 FE) MG tablet Take 325 mg by mouth daily with breakfast.    [provider]  Misc Natural Products (OSTEO BI-FLEX ADV JOINT SHIELD PO) Take 1 tablet by mouth daily.    [provider]  Multiple Vitamin (MULTIVITAMIN) tablet Take 1 tablet by mouth daily.    [provider]  oxybutynin (DITROPAN) 5 MG tablet Take 5 mg by mouth 3 (three) times daily.  06/02/12   [provider]  Potassium Chloride ER 20 MEQ TBCR Take 20 mEq by mouth daily for 5 days. 1 tab daily by mouth 02/12/21 02/17/21  Shon Hale, MD  traMADol (ULTRAM) 50 MG tablet Take 1-2 tablets (50-100 mg total) by mouth every 6 (six) hours as needed for moderate pain. 02/20/22 02/20/23  Loletta Parish., MD      Allergies    Hydrocodone    Review of Systems   Review of Systems  Physical Exam Updated Vital Signs BP (!) 182/81 (BP Location: Right Arm)   Pulse (!) 102   Temp 98.8 F (37.1 C)   Resp 18   Ht 5\' 2"  (1.575 m)   Wt 49.4 kg   SpO2 98%   BMI 19.94 kg/m  Physical Exam Vitals reviewed.  Abdominal:     General: Abdomen is  flat. There is distension.     Palpations: Abdomen is soft.  Neurological:     Mental Status: She is alert.     ED Results / Procedures / Treatments   Labs (all labs ordered are listed, but only abnormal results are displayed) Labs Reviewed  URINALYSIS, ROUTINE W REFLEX MICROSCOPIC - Abnormal; Notable for the following components:      Result Value   Color, Urine RED (*)    APPearance HAZY (*)    Glucose, UA   (*)    Value: TEST NOT REPORTED DUE TO COLOR INTERFERENCE OF URINE PIGMENT   Hgb urine dipstick   (*)    Value: TEST NOT REPORTED DUE TO COLOR INTERFERENCE OF URINE PIGMENT   Bilirubin Urine   (*)    Value: TEST NOT REPORTED DUE TO COLOR INTERFERENCE OF URINE PIGMENT   Ketones, ur   (*)    Value: TEST NOT REPORTED DUE TO COLOR INTERFERENCE OF URINE PIGMENT   Protein, ur   (*)    Value: TEST NOT REPORTED DUE TO COLOR INTERFERENCE OF URINE PIGMENT   Nitrite   (*)    Value: TEST NOT REPORTED DUE TO COLOR INTERFERENCE OF URINE PIGMENT   Leukocytes,Ua   (*)  Value: TEST NOT REPORTED DUE TO COLOR INTERFERENCE OF URINE PIGMENT   All other components within normal limits    EKG None  Radiology No results found.  Procedures Procedures    Medications Ordered in ED Medications - No data to display  ED Course/ Medical Decision Making/ A&P                                 Medical Decision Making 67 year old female here today for clogged indwelling Foley catheter.  Plan- patient tells me she was post to have her indwelling Foley changed tomorrow.  No drainage at this time.  Will have nursing staff replace patient's Foley catheter.  She denies any suprapubic pain.  Reassessment 10:35 PM-for able to replace patient's Foley catheter.  There is some initial bleeding, visualized the Foley bulb in the patient's bladder.  Began to drain a bit more clear.  She says that she always has bleeding at the initial Foley insertion.  She is following with her regular Foley care nurse  tomorrow.  Amount and/or Complexity of Data Reviewed Labs: ordered.           Final Clinical Impression(s) / ED Diagnoses Final diagnoses:  Problem with Foley catheter, initial encounter [facility])    Rx / DC Orders ED Discharge Orders     None         Arletha Pili, DO 12/23/22 2239

## 2023-05-24 ENCOUNTER — Emergency Department (HOSPITAL_COMMUNITY)
Admission: EM | Admit: 2023-05-24 | Discharge: 2023-05-24 | Disposition: A | Discharge status: Home / Self Care | Attending: Emergency Medicine | Admitting: Emergency Medicine

## 2023-05-24 ENCOUNTER — Emergency Department (HOSPITAL_COMMUNITY)

## 2023-05-24 ENCOUNTER — Other Ambulatory Visit: Payer: Self-pay

## 2023-05-24 ENCOUNTER — Encounter (HOSPITAL_COMMUNITY): Payer: Self-pay | Admitting: Emergency Medicine

## 2023-05-24 DIAGNOSIS — K5730 Diverticulosis of large intestine without perforation or abscess without bleeding: Secondary | ICD-10-CM | POA: Insufficient documentation

## 2023-05-24 DIAGNOSIS — R300 Dysuria: Secondary | ICD-10-CM | POA: Diagnosis present

## 2023-05-24 DIAGNOSIS — Z7982 Long term (current) use of aspirin: Secondary | ICD-10-CM | POA: Diagnosis not present

## 2023-05-24 DIAGNOSIS — I700 Atherosclerosis of aorta: Secondary | ICD-10-CM | POA: Insufficient documentation

## 2023-05-24 DIAGNOSIS — N210 Calculus in bladder: Secondary | ICD-10-CM

## 2023-05-24 LAB — CBC WITH DIFFERENTIAL/PLATELET
Abs Immature Granulocytes: 0.03 10*3/uL (ref 0.00–0.07)
Basophils Absolute: 0 10*3/uL (ref 0.0–0.1)
Basophils Relative: 0 %
Eosinophils Absolute: 0.1 10*3/uL (ref 0.0–0.5)
Eosinophils Relative: 1 %
HCT: 37.6 % (ref 36.0–46.0)
Hemoglobin: 12.5 g/dL (ref 12.0–15.0)
Immature Granulocytes: 0 %
Lymphocytes Relative: 24 %
Lymphs Abs: 2.4 10*3/uL (ref 0.7–4.0)
MCH: 33 pg (ref 26.0–34.0)
MCHC: 33.2 g/dL (ref 30.0–36.0)
MCV: 99.2 fL (ref 80.0–100.0)
Monocytes Absolute: 0.7 10*3/uL (ref 0.1–1.0)
Monocytes Relative: 7 %
Neutro Abs: 6.7 10*3/uL (ref 1.7–7.7)
Neutrophils Relative %: 68 %
Platelets: 269 10*3/uL (ref 150–400)
RBC: 3.79 MIL/uL — ABNORMAL LOW (ref 3.87–5.11)
RDW: 11.9 % (ref 11.5–15.5)
WBC: 10 10*3/uL (ref 4.0–10.5)
nRBC: 0 % (ref 0.0–0.2)

## 2023-05-24 LAB — URINALYSIS, ROUTINE W REFLEX MICROSCOPIC
Bacteria, UA: NONE SEEN
Bilirubin Urine: NEGATIVE
Glucose, UA: NEGATIVE mg/dL
Ketones, ur: NEGATIVE mg/dL
Nitrite: NEGATIVE
Protein, ur: 30 mg/dL — AB
Specific Gravity, Urine: 1.003 — ABNORMAL LOW (ref 1.005–1.030)
WBC, UA: >50 WBC/hpf (ref 0–5)
pH: 7 (ref 5.0–8.0)

## 2023-05-24 LAB — BASIC METABOLIC PANEL WITH GFR
Anion gap: 13 (ref 5–15)
BUN: 20 mg/dL (ref 8–23)
CO2: 24 mmol/L (ref 22–32)
Calcium: 9.3 mg/dL (ref 8.9–10.3)
Chloride: 103 mmol/L (ref 98–111)
Creatinine, Ser: 0.9 mg/dL (ref 0.44–1.00)
GFR, Estimated: >60 mL/min (ref >60)
Glucose, Bld: 89 mg/dL (ref 70–99)
Potassium: 3.5 mmol/L (ref 3.5–5.1)
Sodium: 140 mmol/L (ref 135–145)

## 2023-05-24 MED ORDER — CEPHALEXIN 500 MG PO CAPS: 500.0000 mg | ORAL_CAPSULE | Freq: Three times a day (TID) | ORAL | 0 refills | Status: AC

## 2023-05-24 MED ORDER — OXYCODONE-ACETAMINOPHEN 5-325 MG PO TABS: 1.0000 | ORAL_TABLET | Freq: Three times a day (TID) | ORAL | 0 refills | Status: DC | PRN

## 2023-05-24 MED ORDER — PHENAZOPYRIDINE HCL 200 MG PO TABS: 200.0000 mg | ORAL_TABLET | Freq: Two times a day (BID) | ORAL | 0 refills | Status: AC

## 2023-05-24 MED ORDER — SODIUM CHLORIDE 0.9 % IV SOLN
1.0000 g | Freq: Once | INTRAVENOUS | Status: DC
  Filled 2023-05-24: qty 10

## 2023-05-24 MED ORDER — OXYCODONE-ACETAMINOPHEN 5-325 MG PO TABS
1.0000 | ORAL_TABLET | Freq: Once | ORAL | Status: AC
  Administered 2023-05-24: 1 via ORAL
  Filled 2023-05-24: qty 1

## 2023-05-24 MED ORDER — OXYBUTYNIN CHLORIDE 5 MG PO TABS
5.0000 mg | ORAL_TABLET | Freq: Once | ORAL | Status: AC
  Administered 2023-05-24: 5 mg via ORAL
  Filled 2023-05-24: qty 1

## 2023-05-24 MED ORDER — CEPHALEXIN 500 MG PO CAPS
500.0000 mg | ORAL_CAPSULE | Freq: Once | ORAL | Status: AC
  Administered 2023-05-24: 500 mg via ORAL
  Filled 2023-05-24: qty 1

## 2023-05-24 MED ORDER — ONDANSETRON 4 MG PO TBDP
4.0000 mg | ORAL_TABLET | Freq: Once | ORAL | Status: AC
  Administered 2023-05-24: 4 mg via ORAL
  Filled 2023-05-24: qty 1

## 2023-05-24 MED ORDER — PHENAZOPYRIDINE HCL 100 MG PO TABS
200.0000 mg | ORAL_TABLET | Freq: Once | ORAL | Status: AC
  Administered 2023-05-24: 200 mg via ORAL
  Filled 2023-05-24: qty 2

## 2023-05-24 NOTE — ED Triage Notes (Signed)
 Pt with indwelling urinary catheter and states she is having a lot of burning when she urinates. States she thinks she has a UTI.

## 2023-05-24 NOTE — ED Provider Notes (Signed)
 [place] EMERGENCY DEPARTMENT AT [facility] Provider Note   CSN: [id number] Arrival date & time: 05/24/23  0315     History  Chief Complaint  Patient presents with   Dysuria    [name] is a 68 y.o. female.  The history is provided by the patient and the spouse.   Patient with history of multiple sclerosis and chronic indwelling Foley catheter presents with dysuria that started around 3 hours ago. She reports it continues to burn.  No fevers or vomiting.  She does have some mild suprapubic pain.  She reports the urine is yellow in color and is still flowing.  Last catheter change was approximately 2 weeks ago    Home Medications Prior to Admission medications   Medication Sig Start Date End Date Taking? Authorizing Provider  cephALEXin  (KEFLEX ) 500 MG capsule Take 1 capsule (500 mg total) by mouth 3 (three) times daily. 05/24/23  Yes Eldon Greenland, MD  oxyCODONE -acetaminophen  (PERCOCET/ROXICET) 5-325 MG tablet Take 1 tablet by mouth every 8 (eight) hours as needed for severe pain (pain score 7-10). 05/24/23  Yes Eldon Greenland, MD  phenazopyridine (PYRIDIUM) 200 MG tablet Take 1 tablet (200 mg total) by mouth 2 (two) times daily. 05/24/23  Yes Eldon Greenland, MD  Aspirin -Acetaminophen -Caffeine (EXCEDRIN PO) Take by mouth.    [provider]  Calcium Carbonate-Vitamin D (CALCIUM 600 + D PO) Take 1 tablet by mouth daily.    [provider]  Cholecalciferol 50 MCG (2000 UT) TABS D3 DOTS 50 mcg (2,000 unit) tablet    [provider]  ferrous sulfate  325 (65 FE) MG tablet Take 325 mg by mouth daily with breakfast.    [provider]  Misc Natural Products (OSTEO BI-FLEX ADV JOINT SHIELD PO) Take 1 tablet by mouth daily.    [provider]  Multiple Vitamin (MULTIVITAMIN) tablet Take 1 tablet by mouth daily.    [provider]  oxybutynin  (DITROPAN ) 5 MG tablet Take 5 mg by mouth 3 (three) times daily.  06/02/12    [provider]  Potassium Chloride  ER 20 MEQ TBCR Take 20 mEq by mouth daily for 5 days. 1 tab daily by mouth 02/12/21 02/17/21  Colin Dawley, MD      Allergies    Hydrocodone     Review of Systems   Review of Systems  Constitutional:  Negative for fever.  Genitourinary:  Positive for dysuria.    Physical Exam Updated Vital Signs BP (!) 141/77   Pulse 85   Temp (!) 97.5 F (36.4 C) (Oral)   Resp 18   Ht 1.575 m (5\' 2" )   Wt 51.3 kg   SpO2 100%   BMI 20.67 kg/m  Physical Exam CONSTITUTIONAL: Chronically ill-appearing, uncomfortable appearing HEAD: Normocephalic/atraumatic EYES: EOMI/PERRL ABDOMEN: soft, mild suprapubic tenderness, no rebound or guarding, bowel sounds noted throughout abdomen GU: Catheter in place draining yellow urine. No overlying erythema to external genitalia.  Nurse present for exam Husband at bedside with patient consent NEURO: Pt is awake/alert/appropriate, moves all extremitiesx4.  No facial droop.   SKIN: warm, color normal PSYCH: Anxious  ED Results / Procedures / Treatments   Labs (all labs ordered are listed, but only abnormal results are displayed) Labs Reviewed  URINALYSIS, ROUTINE W REFLEX MICROSCOPIC - Abnormal; Notable for the following components:      Result Value   Color, Urine YELLOW (*)    APPearance HAZY (*)    Specific Gravity, Urine 1.003 (*)  Hgb urine dipstick LARGE (*)    Protein, ur 30 (*)    Leukocytes,Ua LARGE (*)    Crystals PRESENT (*)    All other components within normal limits  CBC WITH DIFFERENTIAL/PLATELET - Abnormal; Notable for the following components:   RBC 3.79 (*)    All other components within normal limits  URINE CULTURE  BASIC METABOLIC PANEL WITH GFR    EKG None  Radiology CT Renal Stone Study Result Date: 05/24/2023 CLINICAL DATA:  Dysuria, flank pain EXAM: CT ABDOMEN AND PELVIS WITHOUT CONTRAST TECHNIQUE: Multidetector CT imaging of the abdomen and pelvis was performed  following the standard protocol without IV contrast. RADIATION DOSE REDUCTION: This exam was performed according to the departmental dose-optimization program which includes automated exposure control, adjustment of the mA and/or kV according to patient size and/or use of iterative reconstruction technique. COMPARISON:  11/16/2021 FINDINGS: Lower chest: No acute abnormality. Hepatobiliary: No focal liver abnormality is seen. No gallstones, gallbladder wall thickening, or biliary dilatation. Pancreas: Unremarkable Spleen: Unremarkable Adrenals/Urinary Tract: The adrenal glands are unremarkable. The kidneys are normal on this noncontrast examination. Foley catheter balloon seen within a decompressed bladder lumen. 4 mm calculus noted within the bladder lumen. Stomach/Bowel: Moderate sigmoid diverticulosis. Status post partial small bowel resection. Stomach, small bowel, and large bowel are otherwise unremarkable. Appendix normal. No evidence of obstruction or focal inflammation. No free intraperitoneal gas or fluid. Vascular/Lymphatic: Aortic atherosclerosis. No enlarged abdominal or pelvic lymph nodes. Reproductive: Status post hysterectomy. No adnexal masses. Other: No abdominal wall hernia or abnormality. No abdominopelvic ascites. Musculoskeletal: No acute bone abnormality. Remote T12 and L1 compression deformities with mild loss of height and no retropulsion. No lytic or blastic bone lesion. Osseous structures are age appropriate. IMPRESSION: 1. No acute intra-abdominal pathology identified. No urolithiasis. 2. 4 mm calculus within the bladder lumen. 3. Moderate sigmoid diverticulosis. 4. Remote T12 and L1 compression deformities. Aortic Atherosclerosis (ICD10-I70.0). Electronically Signed   By: Worthy Heads M.D.   On: 05/24/2023 05:23    Procedures Procedures    Medications Ordered in ED Medications  oxyCODONE -acetaminophen  (PERCOCET/ROXICET) 5-325 MG per tablet 1 tablet (1 tablet Oral Given 05/24/23  0343)  ondansetron  (ZOFRAN -ODT) disintegrating tablet 4 mg (4 mg Oral Given 05/24/23 0344)  oxyCODONE -acetaminophen  (PERCOCET/ROXICET) 5-325 MG per tablet 1 tablet (1 tablet Oral Given 05/24/23 0457)  oxybutynin  (DITROPAN ) tablet 5 mg (5 mg Oral Given 05/24/23 0456)  cephALEXin  (KEFLEX ) capsule 500 mg (500 mg Oral Given 05/24/23 0602)  phenazopyridine (PYRIDIUM) tablet 200 mg (200 mg Oral Given 05/24/23 0602)    ED Course/ Medical Decision Making/ A&P Clinical Course as of 05/24/23 0641  Sat May 24, 2023  0338 Will plan for catheter change, check labs and urine and reassess [DW]  0446 Foley catheter was changed without difficulty by nursing staff.  No signs of urinary retention.  However patient continues to have significant burning and having lower abdominal pain.  She appears very uncomfortable.  Will proceed with CT imaging [DW]  0555 Confirm CT findings with radiology the patient has a 4 mm bladder stone.  No other complicating features, no hydronephrosis.  Discussed the case with on-call urology Dr. Willye Harvey.  He reports that if the stone is in the bladder even with the Foley catheter in place, she can still be discharged with outpatient follow-up [DW]  0559 I had a long conversation with patient and husband.  Patient has frequent episodes of burning and pain.  She has been managed by multiple urologist, most recently  Dr. Secundino Dach She has been told she has a small contracted bladder and does have neurogenic bladder.  Back in January 2024 patient had a cystoscopy with litholapaxy.  Patient now having new stone in the bladder.  Husband reports patient has very little relief on a weekly basis from her bladder symptoms and there is not been any good medications to assist her pain. This now appears to be more of a chronic issue.  Overall patient is not septic appearing.  Labs are overall unremarkable, but given appearance of her urinalysis I would favor antibiotics given the recent change in her  symptoms as well as the bladder stone. [DW]  [phone] Pt feels much improved No distress She is not septic appearing We  discussed strict ER return precautions [DW]    Clinical Course User Index [DW] Eldon Greenland, MD                                 Medical Decision Making Amount and/or Complexity of Data Reviewed Labs: ordered. Radiology: ordered.  Risk Prescription drug management.   This patient presents to the ED for concern of dysuria, this involves an extensive number of treatment options, and is a complaint that carries with it a high risk of complications and morbidity.  The differential diagnosis includes but is not limited to hemorrhagic cystitis, bladder spasm, obstructed Foley catheter, vaginitis  Comorbidities that complicate the patient evaluation: Patient's presentation is complicated by their history of multiple sclerosis  Additional history obtained: Additional history obtained from spouse Records reviewed Care Everywhere/External Records  Lab Tests: I Ordered, and personally interpreted labs.  The pertinent results include:  UTI   Imaging Results- CT imaging was reviewed and reveals bladder stone, I concur with radiology report  Medicines ordered and prescription drug management: I ordered medication including percocet  for pain  Reevaluation of the patient after these medicines showed that the patient    stayed the same  Consultations Obtained: I requested consultation with the radiologist dr Martin Slay - patient has bladder stone and consultant urology stoneking , and discussed  findings as well as pertinent plan - they recommend: can be managed as outpatient  Reevaluation: After the interventions noted above, I reevaluated the patient and found that they have :stayed the same  Complexity of problems addressed: Patient's presentation is most consistent with  acute presentation with potential threat to life or bodily function  Disposition: After  consideration of the diagnostic results and the patient's response to treatment,  I feel that the patent would benefit from discharge   .           Final Clinical Impression(s) / ED Diagnoses Final diagnoses:  Dysuria  Bladder stone    Rx / DC Orders ED Discharge Orders          Ordered    cephALEXin  (KEFLEX ) 500 MG capsule  3 times daily        05/24/23 0603    oxyCODONE -acetaminophen  (PERCOCET/ROXICET) 5-325 MG tablet  Every 8 hours PRN        05/24/23 0603    phenazopyridine (PYRIDIUM) 200 MG tablet  2 times daily        05/24/23 9562              Eldon Greenland, MD 05/24/23 [phone]

## 2023-05-24 NOTE — ED Notes (Addendum)
 Verbal order given to hold IV abx, Dr Wallis Gun to change to oral.

## 2023-05-24 NOTE — ED Notes (Signed)
 ED Provider at bedside.

## 2023-05-24 NOTE — ED Notes (Signed)
 Patient verbalizes pain improving. Leg foley bag placed, comfortably secured.

## 2023-05-25 LAB — URINE CULTURE: Culture: NO GROWTH

## 2023-06-13 ENCOUNTER — Other Ambulatory Visit: Payer: Self-pay | Admitting: Urology

## 2023-06-16 NOTE — Progress Notes (Addendum)
 COVID Vaccine received:  []  No [x]  Yes Date of any COVID positive Test in last 90 days:  PCP - Sherolyn Dixon, PA-C [phone] (Work)  [phone] (Fax) Cardiologist - none Neurologist- Dr. Nuhad Elias Zaiton at [facility] Neu[facility]- [phone] (Work)  [phone] (Fax)   Chest x-ray - 02-08-2021  1v  Epic EKG - 02-13-2022 Epic   repeat 06-18-2023 Stress Test -  ECHO -  Cardiac Cath -   Bowel Prep - [x]  No  []   Yes ______  Pacemaker / ICD device [x]  No []  Yes   Spinal Cord Stimulator:[x]  No []  Yes       History of Sleep Apnea? [x]  No []  Yes   CPAP used?- [x]  No []  Yes    Does the patient monitor blood sugar?   [x]  N/A   []  No []  Yes  Patient has: [x]  NO Hx DM   []  Pre-DM   []  DM1  []   DM2  Blood Thinner / Instructions: none Aspirin  Instructions:  Excedrin prn HAs,    ERAS Protocol Ordered: [x]  No  []  Yes Patient is to be NPO after: MN  Dental hx: []  Dentures:   []  N/A      [x]  Bridge or Partial:   Partial dentures top and bottom.                   []  Loose or Damaged teeth:   Comments: Had CBC diff and CMP  Activity level: Patient is unable to climb a flight of stairs without difficulty; [x]  No CP  [x]  No SOB   Patient can perform ADLs without assistance.   Anesthesia review: HTN, MS- no meds, Chronic Foley- last changed out on 06-10-23, scoliosis, anemia,   Patient denies shortness of breath, fever, cough and chest pain at PAT appointment.  Patient verbalized understanding and agreement to the Pre-Surgical Instructions that were given to them at this PAT appointment. Patient was also educated of the need to review these PAT instructions again prior to her surgery.I reviewed the appropriate phone numbers to call if they have any and questions or concerns.

## 2023-06-16 NOTE — Patient Instructions (Addendum)
 SURGICAL WAITING ROOM VISITATION Patients having surgery or a procedure may have no more than 2 support people in the waiting area - these visitors may rotate in the visitor waiting room.   If the patient needs to stay at the hospital during part of their recovery, the visitor guidelines for inpatient rooms apply.  PRE-OP VISITATION  Pre-op nurse will coordinate an appropriate time for 1 support person to accompany the patient in pre-op.  This support person may not rotate.  This visitor will be contacted when the time is appropriate for the visitor to come back in the pre-op area.  Please refer to the [facility] website for the visitor guidelines for Inpatients (after your surgery is over and you are in a regular room).  You are not required to quarantine at this time prior to your surgery. However, you must do this: Hand Hygiene often Do NOT share personal items Notify your provider if you are in close contact with someone who has COVID or you develop fever 100.4 or greater, new onset of sneezing, cough, sore throat, shortness of breath or body aches.  If you test positive for Covid or have been in contact with anyone that has tested positive in the last 10 days please notify you surgeon.    Your procedure is scheduled on:  [facility]  Jun 25, 2023  Report to [facility] Main Entrance: [address] entrance where the [employer] is available.   Report to admitting at:  09:45 AM  Call this number if you have any questions or problems the morning of surgery [phone]  DO NOT EAT OR DRINK ANYTHING AFTER MIDNIGHT THE NIGHT PRIOR TO YOUR SURGERY / PROCEDURE.   FOLLOW  ANY ADDITIONAL PRE OP INSTRUCTIONS YOU RECEIVED FROM YOUR SURGEON'S OFFICE!!!   Oral Hygiene is also important to reduce your risk of infection.        Remember - BRUSH YOUR TEETH THE MORNING OF SURGERY WITH YOUR REGULAR TOOTHPASTE  Do NOT smoke after Midnight the night before surgery.  STOP TAKING all Vitamins,  Herbs and supplements 1 week before your surgery.   Take ONLY these medicines the morning of surgery with A SIP OF WATER : oxybutynin  (Ditropan ), Percocet or Tylenol                      You may not have any metal on your body including hair pins, jewelry, and body piercing  Do not wear make-up, lotions, powders, perfumes  or deodorant  Do not wear nail polish including gel and S&S, artificial / acrylic nails, or any other type of covering on natural nails including finger and toenails. If you have artificial nails, gel coating, etc., that needs to be removed by a nail salon, Please have this removed prior to surgery. Not doing so may mean that your surgery could be cancelled or delayed if the Surgeon or anesthesia staff feels like they are unable to monitor you safely.   Do not shave 48 hours prior to surgery to avoid nicks in your skin which may contribute to postoperative infections.   Contacts, Hearing Aids, dentures or bridgework may not be worn into surgery. DENTURES WILL BE REMOVED PRIOR TO SURGERY PLEASE DO NOT APPLY "Poly grip" OR ADHESIVES!!!  Patients discharged on the day of surgery will not be allowed to drive home.  Someone NEEDS to stay with you for the first 24 hours after anesthesia.  Do not bring your home medications to the hospital. The Pharmacy  will dispense medications listed on your medication list to you during your admission in the Hospital.   Please read over the following fact sheets you were given: IF YOU HAVE QUESTIONS ABOUT YOUR PRE-OP INSTRUCTIONS, PLEASE CALL [phone].   [place] - Preparing for Surgery Before surgery, you can play an important role.  Because skin is not sterile, your skin needs to be as free of germs as possible.  You can reduce the number of germs on your skin by washing with CHG (chlorahexidine gluconate) soap before surgery.  CHG is an antiseptic cleaner which kills germs and bonds with the skin to continue killing germs even after  washing. Please DO NOT use if you have an allergy to CHG or antibacterial soaps.  If your skin becomes reddened/irritated stop using the CHG and inform your nurse when you arrive at Short Stay. Do not shave (including legs and underarms) for at least 48 hours prior to the first CHG shower.  You may shave your face/neck.  Please follow these instructions carefully:  1.  Shower with CHG Soap the night before surgery and the  morning of surgery.  2.  If you choose to wash your hair, wash your hair first as usual with your normal  shampoo.  3.  After you shampoo, rinse your hair and body thoroughly to remove the shampoo.                             4.  Use CHG as you would any other liquid soap.  You can apply chg directly to the skin and wash.  Gently with a scrungie or clean washcloth.  5.  Apply the CHG Soap to your body ONLY FROM THE NECK DOWN.   Do not use on face/ open                           Wound or open sores. Avoid contact with eyes, ears mouth and genitals (private parts).                       Wash face,  Genitals (private parts) with your normal soap.             6.  Wash thoroughly, paying special attention to the area where your  surgery  will be performed.  7.  Thoroughly rinse your body with warm water  from the neck down.  8.  DO NOT shower/wash with your normal soap after using and rinsing off the CHG Soap.            9.  Pat yourself dry with a clean towel.            10.  Wear clean pajamas.            11.  Place clean sheets on your bed the night of your first shower and do not  sleep with pets.  ON THE DAY OF SURGERY : Do not apply any lotions/deodorants the morning of surgery.  Please wear clean clothes to the hospital/surgery center.    FAILURE TO FOLLOW THESE INSTRUCTIONS MAY RESULT IN THE CANCELLATION OF YOUR SURGERY  PATIENT SIGNATURE_________________________________  NURSE  SIGNATURE__________________________________  ________________________________________________________________________

## 2023-06-18 ENCOUNTER — Encounter (HOSPITAL_COMMUNITY): Payer: Self-pay

## 2023-06-18 ENCOUNTER — Encounter (HOSPITAL_COMMUNITY)
Admission: RE | Admit: 2023-06-18 | Discharge: 2023-06-18 | Disposition: A | Discharge status: Home / Self Care | Source: Ambulatory Visit | Attending: Urology | Admitting: Urology

## 2023-06-18 ENCOUNTER — Other Ambulatory Visit: Payer: Self-pay

## 2023-06-18 VITALS — BP 132/78 | Temp 98.0°F | Resp 12 | Ht 60.0 in | Wt 106.0 lb

## 2023-06-18 DIAGNOSIS — N189 Chronic kidney disease, unspecified: Secondary | ICD-10-CM | POA: Insufficient documentation

## 2023-06-18 DIAGNOSIS — N201 Calculus of ureter: Secondary | ICD-10-CM | POA: Diagnosis not present

## 2023-06-18 DIAGNOSIS — D649 Anemia, unspecified: Secondary | ICD-10-CM | POA: Diagnosis not present

## 2023-06-18 DIAGNOSIS — G35 Multiple sclerosis: Secondary | ICD-10-CM | POA: Insufficient documentation

## 2023-06-18 DIAGNOSIS — Z01818 Encounter for other preprocedural examination: Secondary | ICD-10-CM | POA: Insufficient documentation

## 2023-06-18 DIAGNOSIS — N210 Calculus in bladder: Secondary | ICD-10-CM | POA: Insufficient documentation

## 2023-06-18 DIAGNOSIS — I129 Hypertensive chronic kidney disease with stage 1 through stage 4 chronic kidney disease, or unspecified chronic kidney disease: Secondary | ICD-10-CM | POA: Diagnosis not present

## 2023-06-18 DIAGNOSIS — R9431 Abnormal electrocardiogram [ECG] [EKG]: Secondary | ICD-10-CM | POA: Diagnosis not present

## 2023-06-18 DIAGNOSIS — I10 Essential (primary) hypertension: Secondary | ICD-10-CM

## 2023-06-18 HISTORY — DX: Personal history of urinary calculi: Z87.442

## 2023-06-18 HISTORY — DX: Chronic kidney disease, unspecified: N18.9

## 2023-06-18 HISTORY — DX: Unspecified osteoarthritis, unspecified site: M19.90

## 2023-06-18 HISTORY — DX: Anemia, unspecified: D64.9

## 2023-06-18 NOTE — Progress Notes (Signed)
 Case: [id number] Date/Time: 06/25/23 1145   Procedures:      CYSTOSCOPY/URETEROSCOPY/HOLMIUM LASER/STENT PLACEMENT (Left)     CYSTOSCOPY, WITH RETROGRADE PYELOGRAM (Left)   Anesthesia type: General   Diagnosis:      Ureteral stone [N20.1]     Bladder stone [N21.0]   Pre-op diagnosis: LEFT URETERAL VS BLADDER STONE   Location: [facility]   Surgeons: Melody Spurling., MD       DISCUSSION: [name] is a 68 yo female who presents to PAT prior to surgery above. PMH of HTN, MS, headache, CKD, neurogenic bladder with chronic indwelling foley, anemia, arthritis.   PT follows with PCP. Last seen on 06/05/23. All issues stable other than recently diagnosed bladder stone.   Pt follows with Neurology at [facility] for MS. Last seen on 04/23/23. Her MS has been stable off meds which were discontinued due to adverse reactions. She was advised to f/u in 1 year.  VS: BP 132/78 Comment: right arm sitting  Temp 36.7 C (Oral)   Resp 12   Ht 5' (1.524 m)   Wt 48.1 kg   SpO2 100%   BMI 20.70 kg/m   PROVIDERS: PCP - Sherolyn Dixon, PA-C  Neurologist- Dr. Barabara Levering at [facility]  LABS: Labs reviewed: Acceptable for surgery. (all labs ordered are listed, but only abnormal results are displayed)  Labs Reviewed - No data to display   IMAGES: CT Renal 05/24/23:  IMPRESSION: 1. No acute intra-abdominal pathology identified. No urolithiasis. 2. 4 mm calculus within the bladder lumen. 3. Moderate sigmoid diverticulosis. 4. Remote T12 and L1 compression deformities.   Aortic Atherosclerosis (ICD10-I70.0).  EKG 06/18/23:  Normal sinus rhythm, rate 86 Septal infarct , age undetermined  CV:  Past Medical History:  Diagnosis Date   Abdominal fluid collection 05/07/2012   Abdominal pain, RUQ (right upper quadrant) 06/15/2012   Abdominal wall fluid collections 06/15/2012   Anemia    Arthritis    Chronic kidney disease    Headache(784.0)     "several/wk" (05/17/2012)   History of blood transfusion ~ 2012   "after multiple UTI's; got bladder infection; passed alot of blood" (05/05/2012)   History of kidney stones    History of recurrent UTIs    Hypertension    Multiple sclerosis (HCC) 01/28/1970   Neuromuscular disorder (HCC)    MS   Osteoporosis    Scoliosis    Self-catheterizes urinary bladder ~ 2012   "sphincter in bladder doesn't work properly due to my MS" (05/17/2012   Shingles outbreak 08/29/2011   Small bowel obstruction (HCC) 06/17/2012    Past Surgical History:  Procedure Laterality Date   CYSTOSCOPY W/ RETROGRADES Bilateral 02/20/2022   Procedure: CYSTOSCOPY WITH RETROGRADE PYELOGRAM;  Surgeon: Osborn Blaze, MD;  Location: [facility];  Service: Urology;  Laterality: Bilateral;  1 HR   CYSTOSCOPY WITH BIOPSY N/A 02/20/2022   Procedure: CYSTOSCOPY WITH BIOPSY AND FULGERATION;  Surgeon: Osborn Blaze, MD;  Location[facility];  Service: Urology;  Laterality: N/A;   CYSTOSCOPY WITH LITHOLAPAXY N/A 02/20/2022   Procedure: CYSTOSCOPY WITH LITHOLAPAXY;  Surgeon: Osborn Blaze, MD;  Location[facility];  Service: Urology;  Laterality: N/A;   LAPAROTOMY N/A 06/17/2012   Procedure: EXPLORATORY LAPAROTOMY FOR  SMALL BOWEL RESECTION;  Surgeon: Rogena Class, MD;  Location: [facility];  Service: General;  Laterality: N/A;   TONSILLECTOMY     VAGINAL HYSTERECTOMY  01/28/2009   WOUND DEBRIDEMENT Bilateral 06/13/2012   Procedure:  DEBRIDEMENT ABDOMINAL WOUND;  Surgeon: Lockie Rima, MD;  Location: [facility];  Service: General;  Laterality: Bilateral;    MEDICATIONS:  aspirin -acetaminophen -caffeine (EXCEDRIN EXTRA STRENGTH) 250-250-65 MG tablet   Calcium Carbonate-Vitamin D (CALCIUM 600 + D PO)   cephALEXin  (KEFLEX ) 500 MG capsule   Cholecalciferol 50 MCG (2000 UT) TABS   ferrous sulfate  325 (65 FE) MG tablet   Misc Natural Products (GLUCOSAMINE CHOND MSM FORMULA PO)   Multiple Vitamin (MULTIVITAMIN) tablet   oxybutynin  (DITROPAN ) 5  MG tablet   oxyCODONE -acetaminophen  (PERCOCET/ROXICET) 5-325 MG tablet   phenazopyridine  (PYRIDIUM ) 200 MG tablet   No current facility-administered medications for this encounter.   Antoinette Kirschner [facility] Short Stay/Anesthesiology [facility] Phone [phone] 06/18/2023 3:50 PM

## 2023-06-18 NOTE — Anesthesia Preprocedure Evaluation (Signed)
 Anesthesia Evaluation  Patient identified by MRN, date of birth, ID band Patient awake    Reviewed: Allergy & Precautions, H&P , NPO status , Patient's Chart, lab work & pertinent test results  Airway Mallampati: II  TM Distance: >3 FB Neck ROM: Full    Dental no notable dental hx.    Pulmonary neg pulmonary ROS   Pulmonary exam normal breath sounds clear to auscultation       Cardiovascular hypertension, Normal cardiovascular exam Rhythm:Regular Rate:Normal     Neuro/Psych MS  Neuromuscular disease    GI/Hepatic negative GI ROS, Neg liver ROS,,,  Endo/Other  negative endocrine ROS    Renal/GU Renal disease   L ureteral vs bladder stone    Musculoskeletal negative musculoskeletal ROS (+)    Abdominal   Peds  Hematology negative hematology ROS (+)   Anesthesia Other Findings   Reproductive/Obstetrics                             Anesthesia Physical Anesthesia Plan  ASA: 3  Anesthesia Plan: General   Post-op Pain Management: Minimal or no pain anticipated and Tylenol  PO (pre-op)*   Induction: Intravenous  PONV Risk Score and Plan: 3 and Ondansetron , Dexamethasone  and Treatment may vary due to age or medical condition  Airway Management Planned: LMA  Additional Equipment:   Intra-op Plan:   Post-operative Plan: Extubation in OR  Informed Consent: I have reviewed the patients History and Physical, chart, labs and discussed the procedure including the risks, benefits and alternatives for the proposed anesthesia with the patient or authorized representative who has indicated his/her understanding and acceptance.     Dental advisory given  Plan Discussed with: CRNA and Surgeon  Anesthesia Plan Comments:        Anesthesia Quick Evaluation

## 2023-06-25 ENCOUNTER — Ambulatory Visit (HOSPITAL_COMMUNITY)

## 2023-06-25 ENCOUNTER — Encounter (HOSPITAL_COMMUNITY)
Admission: RE | Disposition: A | Discharge status: Home / Self Care | Payer: Self-pay | Source: Home / Self Care | Attending: Urology

## 2023-06-25 ENCOUNTER — Other Ambulatory Visit: Payer: Self-pay

## 2023-06-25 ENCOUNTER — Ambulatory Visit (HOSPITAL_COMMUNITY)
Admission: RE | Admit: 2023-06-25 | Discharge: 2023-06-25 | Disposition: A | Discharge status: Home / Self Care | Attending: Urology | Admitting: Urology

## 2023-06-25 ENCOUNTER — Encounter (HOSPITAL_COMMUNITY): Payer: Self-pay | Admitting: Urology

## 2023-06-25 ENCOUNTER — Ambulatory Visit (HOSPITAL_BASED_OUTPATIENT_CLINIC_OR_DEPARTMENT_OTHER): Payer: Self-pay | Admitting: Anesthesiology

## 2023-06-25 ENCOUNTER — Ambulatory Visit (HOSPITAL_COMMUNITY): Payer: Self-pay | Admitting: Medical

## 2023-06-25 DIAGNOSIS — N319 Neuromuscular dysfunction of bladder, unspecified: Secondary | ICD-10-CM | POA: Diagnosis not present

## 2023-06-25 DIAGNOSIS — N200 Calculus of kidney: Secondary | ICD-10-CM | POA: Insufficient documentation

## 2023-06-25 DIAGNOSIS — N210 Calculus in bladder: Secondary | ICD-10-CM

## 2023-06-25 DIAGNOSIS — A419 Sepsis, unspecified organism: Secondary | ICD-10-CM

## 2023-06-25 DIAGNOSIS — N201 Calculus of ureter: Secondary | ICD-10-CM | POA: Diagnosis present

## 2023-06-25 DIAGNOSIS — N189 Chronic kidney disease, unspecified: Secondary | ICD-10-CM | POA: Diagnosis not present

## 2023-06-25 DIAGNOSIS — G35 Multiple sclerosis: Secondary | ICD-10-CM | POA: Insufficient documentation

## 2023-06-25 DIAGNOSIS — R8271 Bacteriuria: Secondary | ICD-10-CM | POA: Diagnosis not present

## 2023-06-25 DIAGNOSIS — Z87440 Personal history of urinary (tract) infections: Secondary | ICD-10-CM | POA: Diagnosis not present

## 2023-06-25 DIAGNOSIS — I10 Essential (primary) hypertension: Secondary | ICD-10-CM

## 2023-06-25 DIAGNOSIS — Z87442 Personal history of urinary calculi: Secondary | ICD-10-CM | POA: Insufficient documentation

## 2023-06-25 DIAGNOSIS — I129 Hypertensive chronic kidney disease with stage 1 through stage 4 chronic kidney disease, or unspecified chronic kidney disease: Secondary | ICD-10-CM | POA: Diagnosis not present

## 2023-06-25 HISTORY — PX: CYSTOSCOPY/URETEROSCOPY/HOLMIUM LASER/STENT PLACEMENT: SHX6546

## 2023-06-25 HISTORY — PX: CYSTOSCOPY W/ RETROGRADES: SHX1426

## 2023-06-25 SURGERY — CYSTOSCOPY/URETEROSCOPY/HOLMIUM LASER/STENT PLACEMENT
Anesthesia: General | Laterality: Left

## 2023-06-25 MED ORDER — OXYCODONE-ACETAMINOPHEN 5-325 MG PO TABS: 1.0000 | ORAL_TABLET | Freq: Four times a day (QID) | ORAL | 0 refills | Status: AC | PRN

## 2023-06-25 MED ORDER — GENTAMICIN SULFATE 40 MG/ML IJ SOLN
5.0000 mg/kg | INTRAVENOUS | Status: AC
  Administered 2023-06-25: 256.4 mg via INTRAVENOUS
  Filled 2023-06-25: qty 6.5

## 2023-06-25 MED ORDER — ONDANSETRON HCL 4 MG/2ML IJ SOLN
INTRAMUSCULAR | Status: DC | PRN
  Administered 2023-06-25: 4 mg via INTRAVENOUS

## 2023-06-25 MED ORDER — ACETAMINOPHEN 325 MG PO TABS
650.0000 mg | ORAL_TABLET | Freq: Once | ORAL | Status: AC
  Administered 2023-06-25: 650 mg via ORAL
  Filled 2023-06-25: qty 2

## 2023-06-25 MED ORDER — FENTANYL CITRATE (PF) 100 MCG/2ML IJ SOLN
INTRAMUSCULAR | Status: AC
  Filled 2023-06-25: qty 2

## 2023-06-25 MED ORDER — FENTANYL CITRATE (PF) 100 MCG/2ML IJ SOLN
INTRAMUSCULAR | Status: DC | PRN
Start: 2023-06-25 — End: 2023-06-25
  Administered 2023-06-25: 50 ug via INTRAVENOUS

## 2023-06-25 MED ORDER — PROPOFOL 10 MG/ML IV BOLUS
INTRAVENOUS | Status: DC | PRN
  Administered 2023-06-25: 20 mg via INTRAVENOUS
  Administered 2023-06-25: 100 mg via INTRAVENOUS

## 2023-06-25 MED ORDER — CHLORHEXIDINE GLUCONATE 0.12 % MT SOLN
15.0000 mL | Freq: Once | OROMUCOSAL | Status: AC
  Administered 2023-06-25: 15 mL via OROMUCOSAL

## 2023-06-25 MED ORDER — LACTATED RINGERS IV SOLN: INTRAVENOUS | Status: DC

## 2023-06-25 MED ORDER — SODIUM CHLORIDE 0.9 % IR SOLN
Status: DC | PRN
  Administered 2023-06-25: 3000 mL

## 2023-06-25 MED ORDER — ORAL CARE MOUTH RINSE: 15.0000 mL | Freq: Once | OROMUCOSAL | Status: AC

## 2023-06-25 MED ORDER — DEXAMETHASONE SODIUM PHOSPHATE 10 MG/ML IJ SOLN
INTRAMUSCULAR | Status: DC | PRN
Start: 2023-06-25 — End: 2023-06-25
  Administered 2023-06-25: 10 mg via INTRAVENOUS

## 2023-06-25 MED ORDER — PHENYLEPHRINE HCL (PRESSORS) 10 MG/ML IV SOLN
INTRAVENOUS | Status: DC | PRN
  Administered 2023-06-25: 80 ug via INTRAVENOUS

## 2023-06-25 MED ORDER — IOHEXOL 300 MG/ML  SOLN
INTRAMUSCULAR | Status: DC | PRN
Start: 2023-06-25 — End: 2023-06-25
  Administered 2023-06-25: 9 mL

## 2023-06-25 MED ORDER — AMISULPRIDE (ANTIEMETIC) 5 MG/2ML IV SOLN
10.0000 mg | Freq: Once | INTRAVENOUS | Status: DC | PRN
Start: 2023-06-25 — End: 2023-06-25

## 2023-06-25 MED ORDER — FENTANYL CITRATE PF 50 MCG/ML IJ SOSY: 25.0000 ug | PREFILLED_SYRINGE | INTRAMUSCULAR | Status: DC | PRN

## 2023-06-25 MED ORDER — SODIUM CHLORIDE 0.9 % IR SOLN
Status: DC | PRN
Start: 2023-06-25 — End: 2023-06-25
  Administered 2023-06-25: 1000 mL

## 2023-06-25 MED ORDER — LIDOCAINE HCL (CARDIAC) PF 100 MG/5ML IV SOSY
PREFILLED_SYRINGE | INTRAVENOUS | Status: DC | PRN
  Administered 2023-06-25: 40 mg via INTRATRACHEAL

## 2023-06-25 SURGICAL SUPPLY — 22 items
BAG URINE DRAIN 2000ML AR STRL (UROLOGICAL SUPPLIES) IMPLANT
BAG URINE LEG 500ML (DRAIN) IMPLANT
BAG URO CATCHER STRL LF (MISCELLANEOUS) ×1 IMPLANT
BASKET LASER NITINOL 1.9FR (BASKET) IMPLANT
CATH FOLEY 2WAY SLVR 5CC 14FR (CATHETERS) IMPLANT
CATH URETL OPEN END 6FR 70 (CATHETERS) ×1 IMPLANT
CLOTH BEACON ORANGE TIMEOUT ST (SAFETY) ×1 IMPLANT
GLOVE SURG LX STRL 7.5 STRW (GLOVE) ×1 IMPLANT
GOWN STRL REUS W/ TWL XL LVL3 (GOWN DISPOSABLE) ×1 IMPLANT
GUIDEWIRE ANG ZIPWIRE 038X150 (WIRE) ×1 IMPLANT
GUIDEWIRE STR DUAL SENSOR (WIRE) ×1 IMPLANT
KIT TURNOVER KIT A (KITS) IMPLANT
MANIFOLD NEPTUNE II (INSTRUMENTS) ×1 IMPLANT
NS IRRIG 1000ML POUR BTL (IV SOLUTION) IMPLANT
PACK CYSTO (CUSTOM PROCEDURE TRAY) ×1 IMPLANT
SHEATH NAVIGATOR HD 11/13X28 (SHEATH) IMPLANT
SHEATH NAVIGATOR HD 11/13X36 (SHEATH) IMPLANT
SYR 20ML LL LF (SYRINGE) IMPLANT
TRACTIP FLEXIVA PULS ID 200XHI (Laser) IMPLANT
TUBE PU 8FR 16IN ENFIT (TUBING) ×1 IMPLANT
TUBING CONNECTING 10 (TUBING) ×1 IMPLANT
TUBING UROLOGY SET (TUBING) ×1 IMPLANT

## 2023-06-25 NOTE — Anesthesia Procedure Notes (Signed)
 Procedure Name: LMA Insertion Date/Time: 06/25/2023 11:15 AM  Performed by: Judd Northern, RNPre-anesthesia Checklist: Patient identified, Emergency Drugs available, Suction available and Patient being monitored Patient Re-evaluated:Patient Re-evaluated prior to induction Oxygen Delivery Method: Circle System Utilized Preoxygenation: Pre-oxygenation with 100% oxygen Induction Type: IV induction Ventilation: Mask ventilation without difficulty LMA: LMA inserted LMA Size: 3.0 Number of attempts: 1 Airway Equipment and Method: Bite block Placement Confirmation: positive ETCO2 Tube secured with: Tape Dental Injury: Teeth and Oropharynx as per pre-operative assessment

## 2023-06-25 NOTE — Anesthesia Postprocedure Evaluation (Signed)
 Anesthesia Post Note  Patient: [name]  Procedure(s) Performed: CYSTOSCOPY/URETEROSCOPY/WITH BASKETING STONE (Left) CYSTOSCOPY, WITH RETROGRADE PYELOGRAM (Left)     Patient location during evaluation: PACU Anesthesia Type: General Level of consciousness: awake and alert Pain management: pain level controlled Vital Signs Assessment: post-procedure vital signs reviewed and stable Respiratory status: spontaneous breathing, nonlabored ventilation, respiratory function stable and patient connected to nasal cannula oxygen Cardiovascular status: blood pressure returned to baseline and stable Postop Assessment: no apparent nausea or vomiting Anesthetic complications: no  No notable events documented.  Last Vitals:  Vitals:   06/25/23 1215 06/25/23 1230  BP: 129/80 (!) 162/72  Pulse: (!) 58 68  Resp: 17 19  Temp:  36.5 C  SpO2: 99% 98%    Last Pain:  Vitals:   06/25/23 1230  TempSrc:   PainSc: 0-No pain                 Melvenia Stabs

## 2023-06-25 NOTE — Discharge Instructions (Addendum)
1 - You may have urinary urgency (bladder spasms) and bloody urine on / off for up to a week. This is normal.  2 - Call MD or go to ER for fever >102, severe pain / nausea / vomiting not relieved by medications, or acute change in medical status

## 2023-06-25 NOTE — Transfer of Care (Signed)
 Immediate Anesthesia Transfer of Care Note  Patient: [name]  Procedure(s) Performed: CYSTOSCOPY/URETEROSCOPY/WITH BASKETING STONE (Left) CYSTOSCOPY, WITH RETROGRADE PYELOGRAM (Left)  Patient Location: PACU  Anesthesia Type:General  Level of Consciousness: awake, alert , and oriented  Airway & Oxygen Therapy: Patient Spontanous Breathing  Post-op Assessment: Report given to RN  Post vital signs: Reviewed and stable  Last Vitals:  Vitals Value Taken Time  BP 144/77 06/25/23 1157  Temp    Pulse 72 06/25/23 1159  Resp 16 06/25/23 1159  SpO2 100 % 06/25/23 1159  Vitals shown include unfiled device data.  Last Pain:  Vitals:   06/25/23 1013  TempSrc:   PainSc: 0-No pain      Patients Stated Pain Goal: 3 (06/25/23 1013)  Complications: No notable events documented.

## 2023-06-25 NOTE — Brief Op Note (Signed)
 06/25/2023  11:44 AM  PATIENT:  [name]  68 y.o. female  PRE-OPERATIVE DIAGNOSIS:  LEFT URETERAL VS BLADDER STONE  POST-OPERATIVE DIAGNOSIS:  * No post-op diagnosis entered *  PROCEDURE:  CYSTO, LEFT RETROGRADE PYELOGRAM, URETEROSCOPY WITH BASKETING OF STONE, CYSTOLITHALOPEXY STONE <2 CM  SURGEON:  Surgeons and Role:    * Manny, Harvey Linen., MD - Primary  PHYSICIAN ASSISTANT:   ASSISTANTS: none   ANESTHESIA:   general  EBL:  minimal   BLOOD ADMINISTERED:none  DRAINS: Urinary Catheter (Foley)   LOCAL MEDICATIONS USED:  NONE  SPECIMEN:  Source of Specimen:  bladder and left renal stones  DISPOSITION OF SPECIMEN:  given to family  COUNTS:  YES  TOURNIQUET:  * No tourniquets in log *  DICTATION: .Other Dictation: Dictation Number [id number]  PLAN OF CARE: Discharge to home after PACU  PATIENT DISPOSITION:  PACU - hemodynamically stable.   Delay start of Pharmacological VTE agent (>24hrs) due to surgical blood loss or risk of bleeding: yes

## 2023-06-25 NOTE — OR Nursing (Signed)
 Dr. Secundino Dach took kidney stone

## 2023-06-25 NOTE — H&P (Signed)
 [name] is an 68 y.o. female.    Chief Complaint: Pre-OP LEFT Ureteroscopic Stone Manipulation  HPI:   1 - Neurogenic Bladder / Detrussort Hyperactivity with Impaired Contractility / Retention / Small Capacity seondardy to MS - over a decade of NGB managed by numerous providers in multiple locations ([place], [place], [place], [place], [place]). Prior UDS per report but none recent for review. Managed with QID self cath for years, but more recently indwelling catheter and B3 agonists. No help with PTNS. She states her QOL is best with indwelling catheter. Operative cysto 01/2022 with 75mL cystometric capacity (tiny).   12/2021 - Cr 0.65, CT no hydro, Cysto 75mL capacity.   2 - Bacteruria - chronic bacteruria as expected with chronic catheters. No hydro, upper tract stones by recent upper tract imaging. Most recetn postitive CX e. coli sens doxy, getn (Res cipro , bactrim).   3 - Bladder Stone, Hematuria - s/p cystolithalopexy 01/2022 ofr abtou 1.5cm bladder stone.   4 - Left Distal Ureteral v. Bladder Stone - low pelvic pain x few weeks 04/2023. NO help with ABX or topical lidocaine  as pain appeard to be from urerthra / catheter area. CT form ER with ?left UVJ sotne (no hydro) v. tiny bladder stone. NO fevers. UCX negative.   PMH sig for MS (some mild L side weakness and neurogenic bladder most sequelae), scoliosis, benign hyst, small bowel resection (open midline scar). Retired from [employer]. No ischemic CV disease / blood thinners. Her husband [name] is very involved. Her PCP is Jennifer Danger PA.    Today "[name]" is seen to proceed with LEFT ureteroscopoic stone manipulation for susepct LEFT distal stone. No interval fevers. Cr 0,8. Hgb 13 most recently.   Past Medical History:  Diagnosis Date   Abdominal fluid collection 05/07/2012   Abdominal pain, RUQ (right upper quadrant) 06/15/2012   Abdominal wall fluid collections 06/15/2012   Anemia    Arthritis    Chronic kidney  disease    Headache(784.0)    "several/wk" (05/17/2012)   History of blood transfusion ~ 2012   "after multiple UTI's; got bladder infection; passed alot of blood" (05/05/2012)   History of kidney stones    History of recurrent UTIs    Hypertension    Multiple sclerosis (HCC) 01/28/1970   Neuromuscular disorder (HCC)    MS   Osteoporosis    Scoliosis    Self-catheterizes urinary bladder ~ 2012   "sphincter in bladder doesn't work properly due to my MS" (05/17/2012   Shingles outbreak 08/29/2011   Small bowel obstruction (HCC) 06/17/2012    Past Surgical History:  Procedure Laterality Date   CYSTOSCOPY W/ RETROGRADES Bilateral 02/20/2022   Procedure: CYSTOSCOPY WITH RETROGRADE PYELOGRAM;  Surgeon: Osborn Blaze, MD;  Location: [facility];  Service: Urology;  Laterality: Bilateral;  1 HR   CYSTOSCOPY WITH BIOPSY N/A 02/20/2022   Procedure: CYSTOSCOPY WITH BIOPSY AND FULGERATION;  Surgeon: Osborn Blaze, MD;  Location: [facility];  Service: Urology;  Laterality: N/A;   CYSTOSCOPY WITH LITHOLAPAXY N/A 02/20/2022   Procedure: CYSTOSCOPY WITH LITHOLAPAXY;  Surgeon: Osborn Blaze, MD;  Location[facility];  Service: Urology;  Laterality: N/A;   LAPAROTOMY N/A 06/17/2012   Procedure: EXPLORATORY LAPAROTOMY FOR  SMALL BOWEL RESECTION;  Surgeon: Rogena Class, MD;  Location: [facility];  Service: General;  Laterality: N/A;   TONSILLECTOMY     VAGINAL HYSTERECTOMY  01/28/2009   WOUND DEBRIDEMENT Bilateral 06/13/2012   Procedure: DEBRIDEMENT ABDOMINAL WOUND;  Surgeon: Lockie Rima, MD;  Location: [facility];  Service: General;  Laterality: Bilateral;    Family History  Problem Relation Age of Onset   Heart disease Mother    Congestive Heart Failure Mother    Heart disease Father    Stroke Father    Heart disease Brother    Heart attack Brother    Social History:  reports that she has never smoked. She has never used smokeless tobacco. She reports that she does not drink alcohol and does not use  drugs.  Allergies:  Allergies  Allergen Reactions   Hydrocodone  Nausea And Vomiting    Can not tolerate on empty stomach     No medications prior to admission.    No results found for this or any previous visit (from the past 48 hours). No results found.  Review of Systems  Constitutional:  Negative for chills and fever.  All other systems reviewed and are negative.   There were no vitals taken for this visit. Physical Exam Vitals reviewed.  Constitutional:      Comments: Stigmata of stable MS  HENT:     Head: Normocephalic.  Eyes:     Pupils: Pupils are equal, round, and reactive to light.  Cardiovascular:     Rate and Rhythm: Normal rate.  Pulmonary:     Effort: Pulmonary effort is normal.  Abdominal:     General: Abdomen is flat.  Genitourinary:    Comments: No CVAT at present.  Musculoskeletal:        General: Normal range of motion.     Cervical back: Normal range of motion.  Skin:    General: Skin is warm.  Neurological:     General: No focal deficit present.     Mental Status: She is alert.  Psychiatric:        Mood and Affect: Mood normal.      Assessment/Plan  Proceed as planned with LEFT ureteroscopic stone manipulation (vs. Cystolithalopexy) for possible left distal stone. Risks,  benefits, alternatives, expected peri-op course discussed previously and reiterated today. Her chornic foley / bacteruria and chronic neurologic disese increases risk of all peri-op comopliations, especially infection.   Melody Spurling., MD 06/25/2023, 6:54 AM

## 2023-06-26 ENCOUNTER — Encounter (HOSPITAL_COMMUNITY): Payer: Self-pay | Admitting: Urology

## 2023-06-26 NOTE — Op Note (Unsigned)
 NAME: [name], [name]. MEDICAL RECORD NO: [id number] ACCOUNT NO: [id number] DATE OF BIRTH: 1955/04/27 FACILITY: [facility] LOCATION: [facility] PHYSICIAN: Osborn Blaze, MD  Operative Report   DATE OF PROCEDURE: 06/25/2023  PREOPERATIVE DIAGNOSIS:  Multiple sclerosis with neurogenic bladder, bladder versus left ureteral stone.  POSTOPERATIVE DIAGNOSES:  Multiple sclerosis with neurogenic bladder, bladder plus renal stone.  PROCEDURE PERFORMED: 1.  Cystoscopy, left retrograde pyelogram with interpretation. 2.  Left ureteroscopy with basketing of stone. 3.  Cystolitholapaxy stone less than 2 cm.  COMPLICATIONS:  None.  SPECIMENS:  Left renal stone and bladder stones given to the patient's family.  DRAINS:  14-French Foley catheter to straight drain.  INDICATIONS:  The patient is a pleasant but unfortunate 68 year old woman with a longstanding history of multiple sclerosis with neurologic sequelae including neurogenic bladder. Her bladder is nonfunctioning and very small capacity at approximately 75  mL. She is managed most recently with a chronic Foley catheter and does actually fairly well with this. She does form occasional bladder stones requiring cystolitholapaxy. This has been done previously. She was found on workup of pelvic and low flank  pain to have a questionable bladder versus distal ureteral stone on CT imaging and was referred for management. I counseled her towards left ureteroscopy with the goal of stone free, understanding that this stone may actually represent a small bladder  stone, but given its location, it is quite difficult to tell between. She wishes to proceed. Informed consent was obtained and placed in the medical record.  DESCRIPTION OF PROCEDURE:  The patient being herself verified, the procedure being cystolitholapaxy versus left ureteroscopic stone manipulation was confirmed. Procedure timeout was performed.  Intravenous antibiotics administered.  General LMA   anesthesia was introduced. The patient was placed into a low lithotomy position. Sterile field was created, prepped and draped the patient's vagina, introitus, and proximal thighs using iodine.  Cystourethroscopy was performed using 21-French rigid  cystoscope with offset lens. The bladder was quite contracted as anticipated. Ureteral orifices were capacious consistent with known chronic reflux. There was a small curvilinear calcification in the bladder approximately 6 mm or so in length. This did  seem to correspond to the dominant calcification previously noted on CT imaging. This was amenable to simple irrigation with the cystoscope to set aside performing cystolitholapaxy, stone less than 2 cm. As the goal of the surgery today was to verify  stone-free status on the left, the left ureteral orifice was cannulated with 6-French end-hole catheter, and left retrograde pyelogram was obtained.  Left retrograde pyelogram reveals a single left ureter with single-system left kidney.  No filling defects were noted.  ZIPwire was advanced slowly over the pole, set aside as a safety wire.  An 8-French feeding tube was placed in the urinary bladder for  pressure release, and semirigid ureteroscopy was performed of distal orifice of left ureter alongside a separate sensor working wire. No mucosal abnormalities were found or calcifications. The semirigid scope was then exchanged over sensor working wire  for a single-channel flexible digital ureteroscope to the level of the kidney. A systematic inspection was performed in the kidney including all calyces x2. In the lower mid calyx, a dominant calcification was noted. This was only about 2 or 3 mm. It was  amenable with simple basketing. The Escape basket was grasped and brought out in its entirety. Given the very atraumatic nature of the ureteroscopy with no access sheath usage, it was not felt that interval stenting would be warranted. The safety wire  was removed.  A new 14-French Foley catheter was placed per urethra for straight drainage and the procedure was terminated. The patient tolerated the procedure well. No immediate perioperative complications.  The patient was taken to Postanesthesia Care  Unit in stable condition with plan for discharge home.      PAA D: 06/25/2023 11:50:19 am T: 06/26/2023 1:13:00 am  JOB: [id number]/ [id number]
